# Patient Record
Sex: Male | Born: 1940 | Race: White | Hispanic: No | Marital: Married | Smoking: Former smoker
Health system: Southern US, Community
[De-identification: ages and names within clinical notes are randomized; demographics above are authoritative.]

## PROBLEM LIST (undated history)

## (undated) DIAGNOSIS — I10 Essential (primary) hypertension: Secondary | ICD-10-CM

## (undated) DIAGNOSIS — R0602 Shortness of breath: Secondary | ICD-10-CM

## (undated) DIAGNOSIS — I639 Cerebral infarction, unspecified: Secondary | ICD-10-CM

## (undated) DIAGNOSIS — I214 Non-ST elevation (NSTEMI) myocardial infarction: Secondary | ICD-10-CM

## (undated) DIAGNOSIS — C801 Malignant (primary) neoplasm, unspecified: Secondary | ICD-10-CM

## (undated) DIAGNOSIS — H409 Unspecified glaucoma: Secondary | ICD-10-CM

## (undated) DIAGNOSIS — D696 Thrombocytopenia, unspecified: Secondary | ICD-10-CM

---

## 2007-10-28 HISTORY — PX: CATARACT EXTRACTION, BILATERAL: SHX1313

## 2013-02-09 ENCOUNTER — Emergency Department (HOSPITAL_COMMUNITY): Payer: Medicare Other

## 2013-02-09 ENCOUNTER — Inpatient Hospital Stay (HOSPITAL_COMMUNITY)
Admission: EM | Admit: 2013-02-09 | Discharge: 2013-02-12 | DRG: 844 | Disposition: A | Discharge status: Home / Self Care | Payer: Medicare Other | Attending: Internal Medicine | Admitting: Internal Medicine

## 2013-02-09 ENCOUNTER — Encounter (HOSPITAL_COMMUNITY): Payer: Self-pay | Admitting: *Deleted

## 2013-02-09 DIAGNOSIS — C50919 Malignant neoplasm of unspecified site of unspecified female breast: Principal | ICD-10-CM | POA: Diagnosis present

## 2013-02-09 DIAGNOSIS — Z79899 Other long term (current) drug therapy: Secondary | ICD-10-CM

## 2013-02-09 DIAGNOSIS — Z791 Long term (current) use of non-steroidal anti-inflammatories (NSAID): Secondary | ICD-10-CM

## 2013-02-09 DIAGNOSIS — C7952 Secondary malignant neoplasm of bone marrow: Secondary | ICD-10-CM

## 2013-02-09 DIAGNOSIS — M79605 Pain in left leg: Secondary | ICD-10-CM | POA: Diagnosis present

## 2013-02-09 DIAGNOSIS — H409 Unspecified glaucoma: Secondary | ICD-10-CM | POA: Diagnosis present

## 2013-02-09 DIAGNOSIS — C412 Malignant neoplasm of vertebral column: Secondary | ICD-10-CM

## 2013-02-09 DIAGNOSIS — Z87891 Personal history of nicotine dependence: Secondary | ICD-10-CM

## 2013-02-09 DIAGNOSIS — C7970 Secondary malignant neoplasm of unspecified adrenal gland: Secondary | ICD-10-CM | POA: Diagnosis present

## 2013-02-09 DIAGNOSIS — G988 Other disorders of nervous system: Secondary | ICD-10-CM | POA: Diagnosis present

## 2013-02-09 DIAGNOSIS — I10 Essential (primary) hypertension: Secondary | ICD-10-CM | POA: Diagnosis present

## 2013-02-09 DIAGNOSIS — R918 Other nonspecific abnormal finding of lung field: Secondary | ICD-10-CM | POA: Diagnosis present

## 2013-02-09 DIAGNOSIS — C3490 Malignant neoplasm of unspecified part of unspecified bronchus or lung: Secondary | ICD-10-CM | POA: Diagnosis present

## 2013-02-09 DIAGNOSIS — C801 Malignant (primary) neoplasm, unspecified: Secondary | ICD-10-CM

## 2013-02-09 DIAGNOSIS — C7951 Secondary malignant neoplasm of bone: Secondary | ICD-10-CM | POA: Diagnosis present

## 2013-02-09 DIAGNOSIS — M7989 Other specified soft tissue disorders: Secondary | ICD-10-CM | POA: Diagnosis present

## 2013-02-09 DIAGNOSIS — M799 Soft tissue disorder, unspecified: Secondary | ICD-10-CM

## 2013-02-09 DIAGNOSIS — M79609 Pain in unspecified limb: Secondary | ICD-10-CM

## 2013-02-09 DIAGNOSIS — R209 Unspecified disturbances of skin sensation: Secondary | ICD-10-CM | POA: Diagnosis present

## 2013-02-09 HISTORY — DX: Malignant (primary) neoplasm, unspecified: C80.1

## 2013-02-09 HISTORY — DX: Essential (primary) hypertension: I10

## 2013-02-09 HISTORY — DX: Unspecified glaucoma: H40.9

## 2013-02-09 LAB — COMPREHENSIVE METABOLIC PANEL
BUN: 13 mg/dL (ref 6–23)
CO2: 29 mEq/L (ref 19–32)
Calcium: 9.8 mg/dL (ref 8.4–10.5)
Creatinine, Ser: 0.84 mg/dL (ref 0.50–1.35)
GFR calc Af Amer: >90 mL/min (ref >90)
GFR calc non Af Amer: 87 mL/min — ABNORMAL LOW (ref >90)
Glucose, Bld: 122 mg/dL — ABNORMAL HIGH (ref 70–99)
Total Protein: 7.2 g/dL (ref 6.0–8.3)

## 2013-02-09 LAB — CBC WITH DIFFERENTIAL/PLATELET
Eosinophils Absolute: 0.4 10*3/uL (ref 0.0–0.7)
Eosinophils Relative: 2 % (ref 0–5)
HCT: 41.9 % (ref 39.0–52.0)
Lymphocytes Relative: 11 % — ABNORMAL LOW (ref 12–46)
Lymphs Abs: 1.7 10*3/uL (ref 0.7–4.0)
MCH: 31 pg (ref 26.0–34.0)
MCV: 90.1 fL (ref 78.0–100.0)
Monocytes Absolute: 1.1 10*3/uL — ABNORMAL HIGH (ref 0.1–1.0)
Platelets: 220 10*3/uL (ref 150–400)
RBC: 4.65 MIL/uL (ref 4.22–5.81)
WBC: 15.1 10*3/uL — ABNORMAL HIGH (ref 4.0–10.5)

## 2013-02-09 MED ORDER — IOHEXOL 300 MG/ML  SOLN
50.0000 mL | Freq: Once | INTRAMUSCULAR | Status: AC | PRN
  Administered 2013-02-09: 50 mL via ORAL

## 2013-02-09 MED ORDER — SODIUM CHLORIDE 0.9 % IV BOLUS (SEPSIS)
500.0000 mL | Freq: Once | INTRAVENOUS | Status: AC
  Administered 2013-02-09: 500 mL via INTRAVENOUS

## 2013-02-09 MED ORDER — IOHEXOL 300 MG/ML  SOLN
100.0000 mL | Freq: Once | INTRAMUSCULAR | Status: AC | PRN
  Administered 2013-02-09: 100 mL via INTRAVENOUS

## 2013-02-09 MED ORDER — HYDROMORPHONE HCL PF 1 MG/ML IJ SOLN
1.0000 mg | Freq: Once | INTRAMUSCULAR | Status: AC
  Administered 2013-02-09: 1 mg via INTRAVENOUS
  Filled 2013-02-09: qty 1

## 2013-02-09 NOTE — ED Provider Notes (Signed)
History     CSN: [id number]  Arrival date & time 02/09/13  1916   First MD Initiated Contact with Patient 02/09/13 1942      No chief complaint on file.  low back pain with abnormal MRI  (Consider location/radiation/quality/duration/timing/severity/associated sxs/prior treatment) HPI This 72 year old male is sent to the emergency department for admission to the hospitalist service with oncology consultation plan for tomorrow due to abnormal MRI of the lumbar spine and abnormal chest x-ray, patient does have severe lumbar pain radiating down his left leg with weakness and numbness to the left leg since December 2013 progressively worsening over the last 4 months, he is still able to walk with a limp with a skate limited by pain weakness and mild numbness, he is no change in bowel or bladder function, no fever, no IV drug abuse, no trauma, no pain or weakness or numbness down his right leg. He has had no chest pain cough shortness breath abdominal pain. 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 Past Medical History  Diagnosis Date  . Cancer   . Hypertension   . Glaucoma     Past Surgical History  Procedure Laterality Date  . Cataract extraction, bilateral Bilateral 2009    Family History  Problem Relation Age of Onset  . Heart failure Mother   . Stroke Father   . Heart disease Brother     History  Substance Use Topics  . Smoking status: Former Games developer  . Smokeless tobacco: Not on file  . Alcohol  Use: No      Review of Systems 10 Systems reviewed and are negative for acute change except as noted in the HPI. Allergies  Hydrocodone  Home Medications   No current outpatient prescriptions on file.  BP 133/72  Pulse 86  Temp(Src) 97.4 F (36.3 C) (Oral)  Resp 18  Ht 5\' 11"  (1.803 m)  Wt 180 lb (81.647 kg)  BMI 25.12 kg/m2  SpO2 98%  Physical Exam  Nursing note and vitals reviewed. Constitutional:  Awake, alert, nontoxic appearance with baseline speech.  HENT:  Head: Atraumatic.  Eyes: Pupils are equal, round, and reactive to light. Right eye exhibits no discharge. Left eye exhibits no discharge.  Neck: Neck supple.  Cardiovascular: Normal rate and regular rhythm.   No murmur heard. Pulmonary/Chest: Effort normal and breath sounds normal. No respiratory distress. He has no wheezes. He has no rales. He exhibits no tenderness.  Abdominal: Soft. Bowel sounds are normal. He exhibits no mass. There is no tenderness. There is no rebound.  Musculoskeletal: He exhibits tenderness.       Thoracic back: He exhibits no tenderness.       Lumbar back: He exhibits no tenderness.  Diffuse lumber back tenderness, Bilateral lower extremities non tender without new rashes or color change, baseline ROM with intact DP pulses, CR<2 secs all digits bilaterally, sensation baseline light touch bilaterally for pt except mild numbness entire left foot, DTR's symmetric and intact bilaterally KJ / AJ, motor asymmetric right  leg 5 / 5 left leg 4/5 hip flexion, quadriceps, hamstrings, EHL, foot dorsiflexion, foot plantarflexion  Neurological:  Mental status baseline for patient.  Upper extremity motor strength and sensation intact and symmetric bilaterally.  Skin: No rash noted.  Psychiatric: He has a normal mood and affect.    ED Course  Procedures (including critical care time) [facility] with Pt for admit at 2225. Labs Reviewed  CBC WITH DIFFERENTIAL - Abnormal; Notable for the following:    WBC  15.1 (*)    Neutrophils Relative 79 (*)    Neutro Abs 11.9 (*)    Lymphocytes Relative 11 (*)    Monocytes Absolute 1.1 (*)    All other components within normal limits  COMPREHENSIVE METABOLIC PANEL - Abnormal; Notable for the following:    Glucose, Bld 122 (*)    AST 38 (*)    GFR calc non Af Amer 87 (*)    All other components within normal limits  COMPREHENSIVE METABOLIC PANEL - Abnormal; Notable for the following:    Sodium 134 (*)    Albumin 3.4 (*)    GFR calc non Af Amer 90 (*)    All other components within normal limits  CBC - Abnormal; Notable for the following:    WBC 12.7 (*)    RBC 4.16 (*)    Hemoglobin 12.8 (*)    HCT 37.6 (*)    All other components within normal limits  MAGNESIUM  PHOSPHORUS  TSH  PROTIME-INR  SURGICAL PATHOLOGY   Ct Chest W Contrast  02/09/2013  *RADIOLOGY REPORT*  Clinical Data:  Weakness and lower back pain.  Lung mass noted on chest radiograph.  Metastases suspected on lumbar spine MRI.  CT CHEST, ABDOMEN AND PELVIS WITH CONTRAST  Technique:  Multidetector CT imaging of the chest, abdomen and pelvis was performed following the standard protocol during bolus administration of intravenous contrast.  Contrast: 100 mL of Omnipaque 300 IV contrast  Comparison:  MRI of the lumbar spine performed 02/02/2013.  CT CHEST  Findings:  There is a lobulated spiculated mass within the inferior right upper lobe, measuring approximately 3.2 x 2.7 x 2.8 cm, near the right hilum.  This is concerning for primary malignancy. Scattered tiny nodules are noted throughout both lungs, more prominent in the periphery, nonspecific in appearance.  Minimal emphysematous change is noted at the lung apices.  A somewhat larger nonspecific 6 mm nodule is noted within the left lower lobe (image 26 of 61).  No pleural effusion or pneumothorax is seen.  The mediastinum is grossly unremarkable in appearance.  No mediastinal lymphadenopathy is seen.  No pericardial effusion is  identified.  Scattered calcification is noted along the thoracic aorta and proximal great vessels.  The thyroid gland is unremarkable in appearance, aside from minimal hypodensity within the left thyroid lobe.  No dominant mass is seen.  No axillary lymphadenopathy is appreciated.  A large complex soft tissue mass is noted along the right posterolateral chest wall, arising from the right fifth rib, with diffuse bony destruction and expansion.  This measures approximately 7.4 x 3.1 x 5.4 cm, and is concerning for malignancy; metastasis could have this appearance.  There appears to be a lytic lesion within vertebral body T5, with associated mild fragmentation and loss of height at T5.  No definite additional osseous abnormalities are characterized, though evaluation for bony lytic metastatic disease is limited on CT.  IMPRESSION:  1.   Lobulated spiculated mass in the inferior right upper lobe, measuring  3.2 x 2.7 x 2.8 cm, near the right hilum.  This is concerning for primary bronchogenic malignancy. 2.  Scattered tiny nodules throughout both lungs, more prominent in the periphery, nonspecific in appearance.  Mildly larger nonspecific 6 mm nodule in the left lower lobe. 3.  Minimal emphysematous change at the lung apices. 4.  Large complex soft tissue mass along the right posterolateral chest wall, arising from the right fifth rib, with diffuse bony destruction and expansion.  This measures 7.4 x 3.1 x 5.4 cm, and is concerning for malignancy; metastasis could have this appearance. 5.  Lytic lesion within vertebral body T5, with mild fragmentation and loss of height at T5.  No additional osseous abnormalities seen along the thoracic spine, though evaluation for bony metastases is somewhat limited on CT.  CT ABDOMEN AND PELVIS  Findings:  Scattered numerous hypodensities within the liver are nonspecific but have the appearance of cysts; no definite suspicious hepatic lesions are characterized.  These measure up to 3.5  cm in size. The spleen is unremarkable in appearance.  There is suggestion of mild gallbladder wall thickening; the gallbladder is otherwise grossly unremarkable in appearance.  No definite stones are characterized.  There is a mildly heterogeneous 3.1 cm mass at the right adrenal gland; this is suspicious for metastatic disease.  The left adrenal gland is unremarkable in appearance.  The kidneys are unremarkable in appearance.  There is no evidence of hydronephrosis.  No renal or ureteral stones are seen.  No perinephric stranding is appreciated.  No free fluid is identified.  The small bowel is unremarkable in appearance.  The stomach is within normal limits.  No acute vascular abnormalities are seen.  Relatively diffuse calcification is noted along the abdominal aorta and its branches, including at the origins of both renal arteries.  The appendix is normal in caliber, without evidence for appendicitis.  Mild scattered diverticulosis is noted along the cecum and ascending colon, and along the descending and proximal sigmoid colon.  The colon is otherwise unremarkable in appearance.  The bladder is significantly distended and grossly unremarkable in appearance.  The prostate is enlarged, measuring 5.4 cm in transverse dimension.  Small bilateral inguinal hernias are seen, containing only fat.  A small right-sided hydrocele is incidentally noted.  No inguinal lymphadenopathy is seen.  Lytic lesions within vertebral bodies T12, L1, L2, L4, L5 and the upper sacrum are better characterized on recent MRI.  The largest lesions are within L2 and the sacrum; there is partial fragmentation of S1, with mass extending along the left sacral ala and into the overlying soft tissues. This appears to cause mass effect on the left-sided exiting nerve root at S1-S2.  IMPRESSION:  1.  3.1 cm mildly heterogeneous mass at the right adrenal gland is suspicious for metastatic disease.  2.  Lytic lesions within vertebral bodies T12, L1,  L2, L4, L5 and the upper sacrum are better characterized on recent MRI.  Largest lesions are noted within L2 and the sacrum; partial fragmentation of S1, with mass extending along the left sacral ala and into the overlying soft tissues.  This appears to cause mass effect on the left-sided exiting nerve root at S1-S2. 3.  Scattered numerous hypodensities within the liver are nonspecific but appear to reflect cysts.  If there is significant clinical concern for hepatic metastases, further evaluation could be considered. 4.  Suggestion of mild gallbladder wall thickening; gallbladder otherwise unremarkable in appearance. 5.  Relatively diffuse calcification along the abdominal aorta and  its branches, including at the origins of the renal arteries. 6.  Mild scattered diverticulosis along the cecum and ascending colon, and along the descending and proximal sigmoid colon. 7.  Enlarged prostate noted. 8.  Small bilateral inguinal hernias, containing only fat. 9.  Small right-sided hydrocele incidentally seen.   Original Report Authenticated By: Tonia Ghent, M.D.    Ct Abdomen Pelvis W Contrast  02/09/2013  *RADIOLOGY REPORT*  Clinical Data:  Weakness and lower back pain.  Lung mass noted on chest radiograph.  Metastases suspected on lumbar spine MRI.  CT CHEST, ABDOMEN AND PELVIS WITH CONTRAST  Technique:  Multidetector CT imaging of the chest, abdomen and pelvis was performed following the standard protocol during bolus administration of intravenous contrast.  Contrast: 100 mL of Omnipaque 300 IV contrast  Comparison:  MRI of the lumbar spine performed 02/02/2013.  CT CHEST  Findings:  There is a lobulated spiculated mass within the inferior right upper lobe, measuring approximately 3.2 x 2.7 x 2.8 cm, near the right hilum.  This is concerning for primary malignancy. Scattered tiny nodules are noted throughout both lungs, more prominent in the periphery, nonspecific in appearance.  Minimal emphysematous change is  noted at the lung apices.  A somewhat larger nonspecific 6 mm nodule is noted within the left lower lobe (image 26 of 61).  No pleural effusion or pneumothorax is seen.  The mediastinum is grossly unremarkable in appearance.  No mediastinal lymphadenopathy is seen.  No pericardial effusion is identified.  Scattered calcification is noted along the thoracic aorta and proximal great vessels.  The thyroid gland is unremarkable in appearance, aside from minimal hypodensity within the left thyroid lobe.  No dominant mass is seen.  No axillary lymphadenopathy is appreciated.  A large complex soft tissue mass is noted along the right posterolateral chest wall, arising from the right fifth rib, with diffuse bony destruction and expansion.  This measures approximately 7.4 x 3.1 x 5.4 cm, and is concerning for malignancy; metastasis could have this appearance.  There appears to be a lytic lesion within vertebral body T5, with associated mild fragmentation and loss of height at T5.  No definite additional osseous abnormalities are characterized, though evaluation for bony lytic metastatic disease is limited on CT.  IMPRESSION:  1.   Lobulated spiculated mass in the inferior right upper lobe, measuring 3.2 x 2.7 x 2.8 cm, near the right hilum.  This is concerning for primary bronchogenic malignancy. 2.  Scattered tiny nodules throughout both lungs, more prominent in the periphery, nonspecific in appearance.  Mildly larger nonspecific 6 mm nodule in the left lower lobe. 3.  Minimal emphysematous change at the lung apices. 4.  Large complex soft tissue mass along the right posterolateral chest wall, arising from the right fifth rib, with diffuse bony destruction and expansion.  This measures 7.4 x 3.1 x 5.4 cm, and is concerning for malignancy; metastasis could have this appearance. 5.  Lytic lesion within vertebral body T5, with mild fragmentation and loss of height at T5.  No additional osseous abnormalities seen along the  thoracic spine, though evaluation for bony metastases is somewhat limited on CT.  CT ABDOMEN AND PELVIS  Findings:  Scattered numerous hypodensities within the liver are nonspecific but have the appearance of cysts; no definite suspicious hepatic lesions are characterized.  These measure up to 3.5 cm in size. The spleen is unremarkable in appearance.  There is suggestion of mild gallbladder wall thickening; the gallbladder is otherwise grossly unremarkable in  appearance.  No definite stones are characterized.  There is a mildly heterogeneous 3.1 cm mass at the right adrenal gland; this is suspicious for metastatic disease.  The left adrenal gland is unremarkable in appearance.  The kidneys are unremarkable in appearance.  There is no evidence of hydronephrosis.  No renal or ureteral stones are seen.  No perinephric stranding is appreciated.  No free fluid is identified.  The small bowel is unremarkable in appearance.  The stomach is within normal limits.  No acute vascular abnormalities are seen.  Relatively diffuse calcification is noted along the abdominal aorta and its branches, including at the origins of both renal arteries.  The appendix is normal in caliber, without evidence for appendicitis.  Mild scattered diverticulosis is noted along the cecum and ascending colon, and along the descending and proximal sigmoid colon.  The colon is otherwise unremarkable in appearance.  The bladder is significantly distended and grossly unremarkable in appearance.  The prostate is enlarged, measuring 5.4 cm in transverse dimension.  Small bilateral inguinal hernias are seen, containing only fat.  A small right-sided hydrocele is incidentally noted.  No inguinal lymphadenopathy is seen.  Lytic lesions within vertebral bodies T12, L1, L2, L4, L5 and the upper sacrum are better characterized on recent MRI.  The largest lesions are within L2 and the sacrum; there is partial fragmentation of S1, with mass extending along the  left sacral ala and into the overlying soft tissues. This appears to cause mass effect on the left-sided exiting nerve root at S1-S2.  IMPRESSION:  1.  3.1 cm mildly heterogeneous mass at the right adrenal gland is suspicious for metastatic disease.  2.  Lytic lesions within vertebral bodies T12, L1, L2, L4, L5 and the upper sacrum are better characterized on recent MRI.  Largest lesions are noted within L2 and the sacrum; partial fragmentation of S1, with mass extending along the left sacral ala and into the overlying soft tissues.  This appears to cause mass effect on the left-sided exiting nerve root at S1-S2. 3.  Scattered numerous hypodensities within the liver are nonspecific but appear to reflect cysts.  If there is significant clinical concern for hepatic metastases, further evaluation could be considered. 4.  Suggestion of mild gallbladder wall thickening; gallbladder otherwise unremarkable in appearance. 5.  Relatively diffuse calcification along the abdominal aorta and its branches, including at the origins of the renal arteries. 6.  Mild scattered diverticulosis along the cecum and ascending colon, and along the descending and proximal sigmoid colon. 7.  Enlarged prostate noted. 8.  Small bilateral inguinal hernias, containing only fat. 9.  Small right-sided hydrocele incidentally seen.   Original Report Authenticated By: Tonia Ghent, M.D.    Ct Biopsy  02/10/2013  *RADIOLOGY REPORT*  CT BIOPSY  Date: 02/10/2013  Clinical History: 72 year old male with recent CT diagnosis of right upper lobe mass, mediastinal adenopathy and expansile soft tissue mass arising from the right fifth rib concerning for metastatic primary bronchogenic carcinoma.  CT guided biopsy is requested to facilitate tissue diagnosis.  Procedures Performed: 1. CT guided core biopsy  Interventional Radiologist:  Sterling Big, MD  Sedation: Moderate (conscious) sedation was used.  Three mg Versed, 150 mcg Fentanyl were  administered intravenously.  The patient's vital signs were monitored continuously by radiology nursing throughout the procedure.  Sedation Time: 21 minutes  PROCEDURE/FINDINGS:   Informed consent was obtained from the patient following explanation of the procedure, risks, benefits and alternatives. The patient understands, agrees and consents  for the procedure. All questions were addressed. A time out was performed.  Maximal barrier sterile technique utilized including caps, mask, sterile gowns, sterile gloves, large sterile drape, hand hygiene, and betadine skin prep.  A planning axial CT scan was performed.  The expansile soft tissue lesion arising from the posterolateral right fifth rib was successfully identified.  A suitable skin entry site was selected and marked.  Local anesthesia was achieved by infiltration of 1% lidocaine.  Under direct CT fluoroscopic guidance a 17 gauge trocar needle was advanced through the posterior thoracic wall and positioned within the soft tissue mass.  Multiple 80 gauge core biopsies were then obtained using the Biopince automated biopsy device.  There is modest bleeding through the outer coaxial needle. Therefore the biopsy tract was embolized with a single Gelfoam torpedo as the needle was removed.  Post biopsy axial CT imaging demonstrated no evidence of large hematoma or other complication. The patient tolerated the procedure well.  IMPRESSION:  Technically successful CT-guided core biopsy of expansile soft tissue mass arising from the right fifth rib.  Signed,  Sterling Big, MD Vascular & Interventional Radiologist [facility]   Original Report Authenticated By: Malachy Moan, M.D.      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 Hypertension   3. Pain of left leg   4. Soft tissue mass   5. Lung mass   6. Malignant tumor of vertebral column       MDM  The patient appears reasonably stabilized for admission considering the current resources, flow, and  capabilities available in the ED at this time, and I doubt any other [facility] requiring further screening and/or treatment in the ED prior to admission.        Hurman Horn, MD 02/11/13 [phone]

## 2013-02-09 NOTE — ED Notes (Signed)
Pt comes from cancer center in [place], sts has "nodes that need to be biopsied" . 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 Pt's wife sts Dr Ubaldo Glassing is oncology doctor who told them to come to ED for biopsy.

## 2013-02-09 NOTE — H&P (Signed)
PCP: Sherley Bounds,  [facility] [place]   Chief Complaint:   Left Leg pain  HPI: Gabriel [name] is a 72 y.o. male   has a past medical history of Cancer; Hypertension; and Glaucoma.   Presented with  3 months ago he started to have progressive Left leg pain and weakness at this point his walking ability is affected but mainly due to pain.  1 Week ago he had an MRI done of the lumbar spine at [facility] showing L2 lesion measuring 2.9 cm and S1 clear. Replaced by tumor with extension of tumor into the epidural space posterior involvement of the S1 nerve root. There was also lesions involving of the iliac bones bilaterally. A chest x-ray was done and showed questionable or mass this was discussed apparently with oncology who recommended patient to be admitted to [facility] for further biopsy and workup. Of note his workup also showed normal PSA is slightly elevated CEA at 14.1. M spike was not detected on electrophoresis.  The patient denies any incontinence. He states that his walking is affected mainly back pain weakness. He has soft on putting weight on his left leg due to pain and has some pain with sitting. He denies any coughing no hemoptysis. He have lost about 10 pounds lately.   Review of Systems:    Pertinent positives include: weight loss 10 lb over 1 month.  localizing neurological complaints some weakness in left leg.   Constitutional:  No weight loss, night sweats, Fevers, chills, fatigue,  HEENT:  No headaches, Difficulty swallowing,Tooth/dental problems,Sore throat,  No sneezing, itching, ear ache, nasal congestion, post nasal drip,  Cardio-vascular:  No chest pain, Orthopnea, PND, anasarca, dizziness, palpitations.no Bilateral lower extremity swelling  GI:  No heartburn, indigestion, abdominal pain, nausea, vomiting, diarrhea, change in bowel habits, loss of appetite, melena, blood in stool, hematemesis Resp:  no shortness of breath at rest. No dyspnea  on exertion, No excess mucus, no productive cough, No non-productive cough, No coughing up of blood.No change in color of mucus.No wheezing. Skin:  no rash or lesions. No jaundice GU:  no dysuria, change in color of urine, no urgency or frequency. No straining to urinate.  No flank pain.  Musculoskeletal:  No joint pain or no joint swelling. No decreased range of motion. No back pain.  Psych:  No change in mood or affect. No depression or anxiety. No memory loss.  Neuro: no no tingling, no weakness, no double vision, no gait abnormality, no slurred speech, no confusion  Otherwise ROS are negative except for above, 10 systems were reviewed  Past Medical History: Past Medical History  Diagnosis Date  . Cancer   . Hypertension   . Glaucoma    Past Surgical History  Procedure Laterality Date  . Cataract extraction, bilateral Bilateral 2009     Medications: Prior to Admission medications   Medication Sig Start Date End Date Taking? Authorizing Provider  Flaxseed, Linseed, (FLAX SEED OIL PO) Take 1 capsule by mouth daily.   Yes Historical Provider, MD  ibuprofen (ADVIL,MOTRIN) 200 MG tablet Take 400 mg by mouth every 8 (eight) hours as needed for pain.   Yes Historical Provider, MD  Multiple Vitamin (MULTIVITAMIN WITH MINERALS) TABS Take 1 tablet by mouth daily.   Yes Historical Provider, MD  oxyCODONE-acetaminophen (PERCOCET) 10-325 MG per tablet Take 0.5-1 tablets by mouth every 6 (six) hours as needed for pain.   Yes Historical Provider, MD    Allergies:   Allergies  Allergen  Reactions  . Hydrocodone Rash    Social History:  Ambulatory  Independently  Lives at home alone   reports that he has quit smoking. He does not have any smokeless tobacco history on file. He reports that he does not drink alcohol or use illicit drugs.   Family History: family history includes Heart disease in his brother; Heart failure in his mother; and Stroke in his father.    Physical  Exam: Patient Vitals for the past 24 hrs:  BP Temp Temp src Pulse Resp SpO2  02/09/13 1925 190/85 mmHg 98.2 F (36.8 C) Oral 81 14 99 %    1. General:  in No Acute distress 2. Psychological: Alert and   Oriented 3. Head/ENT:   Moist   Mucous Membranes                          Head Non traumatic, neck supple                          Normal  Dentition 4. SKIN:  decreased Skin turgor,  Skin clean Dry and intact no rash 5. Heart: Regular rate and rhythm no Murmur, Rub or gallop 6. Lungs: somewhat distant but no wheezes or crackles   7. Abdomen: Soft, non-tender, Non distended 8. Lower extremities: no clubbing, cyanosis, or edema 9. Neurologically strength 5/5 in all 4 ext, no noticible loss of strength in both legs, down going babiski bilaterally. CN 2-12 intact 10. MSK: Normal range of motion  body mass index is unknown because there is no height or weight on file.   Labs on Admission:   Recent Labs  02/09/13 2025  NA 136  K 4.5  CL 98  CO2 29  GLUCOSE 122*  BUN 13  CREATININE 0.84  CALCIUM 9.8    Recent Labs  02/09/13 2025  AST 38*  ALT 29  ALKPHOS 91  BILITOT 0.6  PROT 7.2  ALBUMIN 3.9   No results found for this basename: LIPASE, AMYLASE,  in the last 72 hours  Recent Labs  02/09/13 2025  WBC 15.1*  NEUTROABS 11.9*  HGB 14.4  HCT 41.9  MCV 90.1  PLT 220   No results found for this basename: CKTOTAL, CKMB, CKMBINDEX, TROPONINI,  in the last 72 hours No results found for this basename: TSH, T4TOTAL, FREET3, T3FREE, THYROIDAB,  in the last 72 hours No results found for this basename: VITAMINB12, FOLATE, FERRITIN, TIBC, IRON, RETICCTPCT,  in the last 72 hours No results found for this basename: HGBA1C    CrCl is unknown because there is no height on file for the current visit. ABG No results found for this basename: phart, pco2, po2, hco3, tco2, acidbasedef, o2sat     No results found for this basename: DDIMER    Cultures: No results found for  this basename: sdes, specrequest, cult, reptstatus       Radiological Exams on Admission: Ct Chest W Contrast  02/09/2013  *RADIOLOGY REPORT*  Clinical Data:  Weakness and lower back pain.  Lung mass noted on chest radiograph.  Metastases suspected on lumbar spine MRI.  CT CHEST, ABDOMEN AND PELVIS WITH CONTRAST  Technique:  Multidetector CT imaging of the chest, abdomen and pelvis was performed following the standard protocol during bolus administration of intravenous contrast.  Contrast: 100 mL of Omnipaque 300 IV contrast  Comparison:  MRI of the lumbar spine performed 02/02/2013.  CT CHEST  Findings:  There is a lobulated spiculated mass within the inferior right upper lobe, measuring approximately 3.2 x 2.7 x 2.8 cm, near the right hilum.  This is concerning for primary malignancy. Scattered tiny nodules are noted throughout both lungs, more prominent in the periphery, nonspecific in appearance.  Minimal emphysematous change is noted at the lung apices.  A somewhat larger nonspecific 6 mm nodule is noted within the left lower lobe (image 26 of 61).  No pleural effusion or pneumothorax is seen.  The mediastinum is grossly unremarkable in appearance.  No mediastinal lymphadenopathy is seen.  No pericardial effusion is identified.  Scattered calcification is noted along the thoracic aorta and proximal great vessels.  The thyroid gland is unremarkable in appearance, aside from minimal hypodensity within the left thyroid lobe.  No dominant mass is seen.  No axillary lymphadenopathy is appreciated.  A large complex soft tissue mass is noted along the right posterolateral chest wall, arising from the right fifth rib, with diffuse bony destruction and expansion.  This measures approximately 7.4 x 3.1 x 5.4 cm, and is concerning for malignancy; metastasis could have this appearance.  There appears to be a lytic lesion within vertebral body T5, with associated mild fragmentation and loss of height at T5.  No  definite additional osseous abnormalities are characterized, though evaluation for bony lytic metastatic disease is limited on CT.  IMPRESSION:  1.   Lobulated spiculated mass in the inferior right upper lobe, measuring 3.2 x 2.7 x 2.8 cm, near the right hilum.  This is concerning for primary bronchogenic malignancy. 2.  Scattered tiny nodules throughout both lungs, more prominent in the periphery, nonspecific in appearance.  Mildly larger nonspecific 6 mm nodule in the left lower lobe. 3.  Minimal emphysematous change at the lung apices. 4.  Large complex soft tissue mass along the right posterolateral chest wall, arising from the right fifth rib, with diffuse bony destruction and expansion.  This measures 7.4 x 3.1 x 5.4 cm, and is concerning for malignancy; metastasis could have this appearance. 5.  Lytic lesion within vertebral body T5, with mild fragmentation and loss of height at T5.  No additional osseous abnormalities seen along the thoracic spine, though evaluation for bony metastases is somewhat limited on CT.  CT ABDOMEN AND PELVIS  Findings:  Scattered numerous hypodensities within the liver are nonspecific but have the appearance of cysts; no definite suspicious hepatic lesions are characterized.  These measure up to 3.5 cm in size. The spleen is unremarkable in appearance.  There is suggestion of mild gallbladder wall thickening; the gallbladder is otherwise grossly unremarkable in appearance.  No definite stones are characterized.  There is a mildly heterogeneous 3.1 cm mass at the right adrenal gland; this is suspicious for metastatic disease.  The left adrenal gland is unremarkable in appearance.  The kidneys are unremarkable in appearance.  There is no evidence of hydronephrosis.  No renal or ureteral stones are seen.  No perinephric stranding is appreciated.  No free fluid is identified.  The small bowel is unremarkable in appearance.  The stomach is within normal limits.  No acute vascular  abnormalities are seen.  Relatively diffuse calcification is noted along the abdominal aorta and its branches, including at the origins of both renal arteries.  The appendix is normal in caliber, without evidence for appendicitis.  Mild scattered diverticulosis is noted along the cecum and ascending colon, and along the descending and proximal sigmoid colon.  The colon is otherwise unremarkable  in appearance.  The bladder is significantly distended and grossly unremarkable in appearance.  The prostate is enlarged, measuring 5.4 cm in transverse dimension.  Small bilateral inguinal hernias are seen, containing only fat.  A small right-sided hydrocele is incidentally noted.  No inguinal lymphadenopathy is seen.  Lytic lesions within vertebral bodies T12, L1, L2, L4, L5 and the upper sacrum are better characterized on recent MRI.  The largest lesions are within L2 and the sacrum; there is partial fragmentation of S1, with mass extending along the left sacral ala and into the overlying soft tissues. This appears to cause mass effect on the left-sided exiting nerve root at S1-S2.  IMPRESSION:  1.  3.1 cm mildly heterogeneous mass at the right adrenal gland is suspicious for metastatic disease.  2.  Lytic lesions within vertebral bodies T12, L1, L2, L4, L5 and the upper sacrum are better characterized on recent MRI.  Largest lesions are noted within L2 and the sacrum; partial fragmentation of S1, with mass extending along the left sacral ala and into the overlying soft tissues.  This appears to cause mass effect on the left-sided exiting nerve root at S1-S2. 3.  Scattered numerous hypodensities within the liver are nonspecific but appear to reflect cysts.  If there is significant clinical concern for hepatic metastases, further evaluation could be considered. 4.  Suggestion of mild gallbladder wall thickening; gallbladder otherwise unremarkable in appearance. 5.  Relatively diffuse calcification along the abdominal aorta  and its branches, including at the origins of the renal arteries. 6.  Mild scattered diverticulosis along the cecum and ascending colon, and along the descending and proximal sigmoid colon. 7.  Enlarged prostate noted. 8.  Small bilateral inguinal hernias, containing only fat. 9.  Small right-sided hydrocele incidentally seen.   Original Report Authenticated By: Tonia Ghent, M.D.    Ct Abdomen Pelvis W Contrast  02/09/2013  *RADIOLOGY REPORT*  Clinical Data:  Weakness and lower back pain.  Lung mass noted on chest radiograph.  Metastases suspected on lumbar spine MRI.  CT CHEST, ABDOMEN AND PELVIS WITH CONTRAST  Technique:  Multidetector CT imaging of the chest, abdomen and pelvis was performed following the standard protocol during bolus administration of intravenous contrast.  Contrast: 100 mL of Omnipaque 300 IV contrast  Comparison:  MRI of the lumbar spine performed 02/02/2013.  CT CHEST  Findings:  There is a lobulated spiculated mass within the inferior right upper lobe, measuring approximately 3.2 x 2.7 x 2.8 cm, near the right hilum.  This is concerning for primary malignancy. Scattered tiny nodules are noted throughout both lungs, more prominent in the periphery, nonspecific in appearance.  Minimal emphysematous change is noted at the lung apices.  A somewhat larger nonspecific 6 mm nodule is noted within the left lower lobe (image 26 of 61).  No pleural effusion or pneumothorax is seen.  The mediastinum is grossly unremarkable in appearance.  No mediastinal lymphadenopathy is seen.  No pericardial effusion is identified.  Scattered calcification is noted along the thoracic aorta and proximal great vessels.  The thyroid gland is unremarkable in appearance, aside from minimal hypodensity within the left thyroid lobe.  No dominant mass is seen.  No axillary lymphadenopathy is appreciated.  A large complex soft tissue mass is noted along the right posterolateral chest wall, arising from the right fifth  rib, with diffuse bony destruction and expansion.  This measures approximately 7.4 x 3.1 x 5.4 cm, and is concerning for malignancy; metastasis could have this appearance.  There appears to be a lytic lesion within vertebral body T5, with associated mild fragmentation and loss of height at T5.  No definite additional osseous abnormalities are characterized, though evaluation for bony lytic metastatic disease is limited on CT.  IMPRESSION:  1.   Lobulated spiculated mass in the inferior right upper lobe, measuring 3.2 x 2.7 x 2.8 cm, near the right hilum.  This is concerning for primary bronchogenic malignancy. 2.  Scattered tiny nodules throughout both lungs, more prominent in the periphery, nonspecific in appearance.  Mildly larger nonspecific 6 mm nodule in the left lower lobe. 3.  Minimal emphysematous change at the lung apices. 4.  Large complex soft tissue mass along the right posterolateral chest wall, arising from the right fifth rib, with diffuse bony destruction and expansion.  This measures 7.4 x 3.1 x 5.4 cm, and is concerning for malignancy; metastasis could have this appearance. 5.  Lytic lesion within vertebral body T5, with mild fragmentation and loss of height at T5.  No additional osseous abnormalities seen along the thoracic spine, though evaluation for bony metastases is somewhat limited on CT.  CT ABDOMEN AND PELVIS  Findings:  Scattered numerous hypodensities within the liver are nonspecific but have the appearance of cysts; no definite suspicious hepatic lesions are characterized.  These measure up to 3.5 cm in size. The spleen is unremarkable in appearance.  There is suggestion of mild gallbladder wall thickening; the gallbladder is otherwise grossly unremarkable in appearance.  No definite stones are characterized.  There is a mildly heterogeneous 3.1 cm mass at the right adrenal gland; this is suspicious for metastatic disease.  The left adrenal gland is unremarkable in appearance.  The  kidneys are unremarkable in appearance.  There is no evidence of hydronephrosis.  No renal or ureteral stones are seen.  No perinephric stranding is appreciated.  No free fluid is identified.  The small bowel is unremarkable in appearance.  The stomach is within normal limits.  No acute vascular abnormalities are seen.  Relatively diffuse calcification is noted along the abdominal aorta and its branches, including at the origins of both renal arteries.  The appendix is normal in caliber, without evidence for appendicitis.  Mild scattered diverticulosis is noted along the cecum and ascending colon, and along the descending and proximal sigmoid colon.  The colon is otherwise unremarkable in appearance.  The bladder is significantly distended and grossly unremarkable in appearance.  The prostate is enlarged, measuring 5.4 cm in transverse dimension.  Small bilateral inguinal hernias are seen, containing only fat.  A small right-sided hydrocele is incidentally noted.  No inguinal lymphadenopathy is seen.  Lytic lesions within vertebral bodies T12, L1, L2, L4, L5 and the upper sacrum are better characterized on recent MRI.  The largest lesions are within L2 and the sacrum; there is partial fragmentation of S1, with mass extending along the left sacral ala and into the overlying soft tissues. This appears to cause mass effect on the left-sided exiting nerve root at S1-S2.  IMPRESSION:  1.  3.1 cm mildly heterogeneous mass at the right adrenal gland is suspicious for metastatic disease.  2.  Lytic lesions within vertebral bodies T12, L1, L2, L4, L5 and the upper sacrum are better characterized on recent MRI.  Largest lesions are noted within L2 and the sacrum; partial fragmentation of S1, with mass extending along the left sacral ala and into the overlying soft tissues.  This appears to cause mass effect on the left-sided exiting nerve root  at S1-S2. 3.  Scattered numerous hypodensities within the liver are nonspecific but  appear to reflect cysts.  If there is significant clinical concern for hepatic metastases, further evaluation could be considered. 4.  Suggestion of mild gallbladder wall thickening; gallbladder otherwise unremarkable in appearance. 5.  Relatively diffuse calcification along the abdominal aorta and its branches, including at the origins of the renal arteries. 6.  Mild scattered diverticulosis along the cecum and ascending colon, and along the descending and proximal sigmoid colon. 7.  Enlarged prostate noted. 8.  Small bilateral inguinal hernias, containing only fat. 9.  Small right-sided hydrocele incidentally seen.   Original Report Authenticated By: Tonia Ghent, M.D.     Chart has been reviewed  Assessment/Plan  72 year old male with newly diagnosed metastatic disease involving spine, soft tissue or chest and rib, and adrenal gland with primary being most likely lung cancer will need further workup and biopsies  Present on Admission:  . 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 He will need a biopsy to confirm this. Have spoken to pulmonology for possible bronchoscopy in the morning. Given extensive pain will give pain medication as well as start steroids in hopes to help with neurogenic pain due to S1 compression.  Have spoken to oncology they will see patient in a.m. Have discussed the case with cardiology at this point there is no evidence of spinal cord involvement.  . Hypertension - stable continue to monitor continue home medications  . Pain of left leg - initiated steroids and narcotic pain medication  . Lung mass - most likely primary has spoken to pulmonology who will see him in a.m. at that point a decision could be made whether the site of biopsy would be bronchoscopy versus soft tissue mass  . Soft tissue mass - likely metastatic spread from primary would need to discuss with radiology if that's a good source for biopsy if bronchoscopy is  not an option   Prophylaxis: SCD  CODE STATUS: FULL CODE  Other plan as per orders.  I have spent a total of 75 min on this admission time taken to discuss the case extensively with oncology and radiology as well as pulmonology  Tarik Teixeira 02/09/2013, 10:33 PM

## 2013-02-10 ENCOUNTER — Ambulatory Visit
Admit: 2013-02-10 | Discharge: 2013-02-10 | Disposition: A | Discharge status: Home / Self Care | Payer: Medicare Other | Attending: Radiation Oncology | Admitting: Radiation Oncology

## 2013-02-10 ENCOUNTER — Inpatient Hospital Stay (HOSPITAL_COMMUNITY): Payer: Medicare Other

## 2013-02-10 ENCOUNTER — Encounter (HOSPITAL_COMMUNITY): Payer: Self-pay | Admitting: *Deleted

## 2013-02-10 ENCOUNTER — Other Ambulatory Visit: Payer: Self-pay | Admitting: Radiation Oncology

## 2013-02-10 DIAGNOSIS — C412 Malignant neoplasm of vertebral column: Secondary | ICD-10-CM

## 2013-02-10 DIAGNOSIS — R222 Localized swelling, mass and lump, trunk: Secondary | ICD-10-CM

## 2013-02-10 LAB — COMPREHENSIVE METABOLIC PANEL
Albumin: 3.4 g/dL — ABNORMAL LOW (ref 3.5–5.2)
BUN: 11 mg/dL (ref 6–23)
Chloride: 98 mEq/L (ref 96–112)
Creatinine, Ser: 0.79 mg/dL (ref 0.50–1.35)
GFR calc Af Amer: >90 mL/min (ref >90)
GFR calc non Af Amer: 90 mL/min — ABNORMAL LOW (ref >90)
Total Bilirubin: 0.5 mg/dL (ref 0.3–1.2)

## 2013-02-10 LAB — CBC
MCV: 90.4 fL (ref 78.0–100.0)
Platelets: 188 10*3/uL (ref 150–400)
RBC: 4.16 MIL/uL — ABNORMAL LOW (ref 4.22–5.81)
WBC: 12.7 10*3/uL — ABNORMAL HIGH (ref 4.0–10.5)

## 2013-02-10 LAB — MAGNESIUM: Magnesium: 1.9 mg/dL (ref 1.5–2.5)

## 2013-02-10 LAB — PROTIME-INR
INR: 1 (ref 0.00–1.49)
Prothrombin Time: 13.1 seconds (ref 11.6–15.2)

## 2013-02-10 LAB — PHOSPHORUS: Phosphorus: 3.4 mg/dL (ref 2.3–4.6)

## 2013-02-10 MED ORDER — SODIUM CHLORIDE 0.9 % IV SOLN
10.0000 mg | Freq: Once | INTRAVENOUS | Status: AC
  Administered 2013-02-10: 10 mg via INTRAVENOUS
  Filled 2013-02-10: qty 1

## 2013-02-10 MED ORDER — AMLODIPINE BESYLATE 10 MG PO TABS
10.0000 mg | ORAL_TABLET | Freq: Every day | ORAL | Status: DC
  Administered 2013-02-10 – 2013-02-12 (×3): 10 mg via ORAL
  Filled 2013-02-10 (×3): qty 1

## 2013-02-10 MED ORDER — SODIUM CHLORIDE 0.9 % IJ SOLN: 3.0000 mL | Freq: Two times a day (BID) | INTRAMUSCULAR | Status: DC

## 2013-02-10 MED ORDER — VITAMINS A & D EX OINT
TOPICAL_OINTMENT | CUTANEOUS | Status: AC
  Administered 2013-02-10: 17:00:00
  Filled 2013-02-10: qty 5

## 2013-02-10 MED ORDER — FENTANYL CITRATE 0.05 MG/ML IJ SOLN
INTRAMUSCULAR | Status: AC
  Filled 2013-02-10: qty 6

## 2013-02-10 MED ORDER — ONDANSETRON HCL 4 MG/2ML IJ SOLN: 4.0000 mg | Freq: Four times a day (QID) | INTRAMUSCULAR | Status: DC | PRN

## 2013-02-10 MED ORDER — FENTANYL CITRATE 0.05 MG/ML IJ SOLN
INTRAMUSCULAR | Status: AC | PRN
  Administered 2013-02-10 (×3): 50 ug via INTRAVENOUS

## 2013-02-10 MED ORDER — SODIUM CHLORIDE 0.9 % IJ SOLN: 3.0000 mL | INTRAMUSCULAR | Status: DC | PRN

## 2013-02-10 MED ORDER — ONDANSETRON HCL 4 MG PO TABS: 4.0000 mg | ORAL_TABLET | Freq: Four times a day (QID) | ORAL | Status: DC | PRN

## 2013-02-10 MED ORDER — LOSARTAN POTASSIUM 50 MG PO TABS
100.0000 mg | ORAL_TABLET | Freq: Every day | ORAL | Status: DC
  Filled 2013-02-10: qty 2

## 2013-02-10 MED ORDER — MIDAZOLAM HCL 2 MG/2ML IJ SOLN
INTRAMUSCULAR | Status: AC | PRN
  Administered 2013-02-10 (×3): 1 mg via INTRAVENOUS

## 2013-02-10 MED ORDER — HYDROCODONE-ACETAMINOPHEN 5-325 MG PO TABS
1.0000 | ORAL_TABLET | ORAL | Status: DC | PRN
  Administered 2013-02-10 – 2013-02-12 (×12): 2 via ORAL
  Filled 2013-02-10 (×12): qty 2

## 2013-02-10 MED ORDER — TIMOLOL MALEATE 0.5 % OP SOLN
1.0000 [drp] | Freq: Every day | OPHTHALMIC | Status: DC
  Administered 2013-02-11 – 2013-02-12 (×2): 1 [drp] via OPHTHALMIC
  Filled 2013-02-10: qty 5

## 2013-02-10 MED ORDER — ACETAMINOPHEN 650 MG RE SUPP: 650.0000 mg | Freq: Four times a day (QID) | RECTAL | Status: DC | PRN

## 2013-02-10 MED ORDER — MORPHINE SULFATE 2 MG/ML IJ SOLN: 2.0000 mg | INTRAMUSCULAR | Status: DC | PRN

## 2013-02-10 MED ORDER — MIDAZOLAM HCL 2 MG/2ML IJ SOLN
INTRAMUSCULAR | Status: AC
  Filled 2013-02-10: qty 6

## 2013-02-10 MED ORDER — ACETAMINOPHEN 325 MG PO TABS: 650.0000 mg | ORAL_TABLET | Freq: Four times a day (QID) | ORAL | Status: DC | PRN

## 2013-02-10 MED ORDER — LOSARTAN POTASSIUM 50 MG PO TABS
100.0000 mg | ORAL_TABLET | Freq: Every day | ORAL | Status: DC
  Administered 2013-02-10 – 2013-02-12 (×3): 100 mg via ORAL
  Filled 2013-02-10 (×3): qty 2

## 2013-02-10 MED ORDER — DOCUSATE SODIUM 100 MG PO CAPS
100.0000 mg | ORAL_CAPSULE | Freq: Two times a day (BID) | ORAL | Status: DC
  Administered 2013-02-10 – 2013-02-12 (×4): 100 mg via ORAL
  Filled 2013-02-10 (×6): qty 1

## 2013-02-10 MED ORDER — SODIUM CHLORIDE 0.9 % IV SOLN
250.0000 mL | INTRAVENOUS | Status: DC | PRN
  Administered 2013-02-11: 250 mL via INTRAVENOUS

## 2013-02-10 MED ORDER — DEXAMETHASONE 6 MG PO TABS
6.0000 mg | ORAL_TABLET | Freq: Four times a day (QID) | ORAL | Status: DC
  Administered 2013-02-10 – 2013-02-12 (×9): 6 mg via ORAL
  Filled 2013-02-10 (×13): qty 1

## 2013-02-10 NOTE — Procedures (Signed)
Interventional Radiology Procedure Note  Procedure: CT guided core biopsy of right posterior rib lesion. Complications: None Recommendations: - Bedrest x 2 hrs - Resume prior diet - Pathology pending  Signed,  Sterling Big, MD Vascular & Interventional Radiologist [facility]

## 2013-02-10 NOTE — Progress Notes (Signed)
INITIAL NUTRITION ASSESSMENT  DOCUMENTATION CODES Per approved criteria  -Not Applicable   INTERVENTION: - Recommend Ensure Complete BID when diet advanced - Will continue to monitor   NUTRITION DIAGNOSIS: Inadequate oral intake related to inability to eat as evidenced by NPO.   Goal: Advance diet as tolerated to regular diet  Monitor:  Weights, labs, diet advancement  Reason for Assessment: Nutrition risk   72 y.o. male  Admitting Dx: Left leg pain  ASSESSMENT: Pt with newly diagnosed metastatic with primary most likely being lung CA. Pt out of room for procedure. Per H&P, pt has lost 10 pounds unintentionally in the past month PTA.   Height: Ht Readings from Last 1 Encounters:  02/10/13 5\' 11"  (1.803 m)    Weight: Wt Readings from Last 1 Encounters:  02/10/13 180 lb (81.647 kg)    Ideal Body Weight: 172 lb  % Ideal Body Weight: 105  Wt Readings from Last 10 Encounters:  02/10/13 180 lb (81.647 kg)    Usual Body Weight: 190 lb per H&P  % Usual Body Weight: 95  BMI:  Body mass index is 25.12 kg/(m^2).  Estimated Nutritional Needs: Kcal: 2050-2450 Protein: 95-120g Fluid: 2-2.4L/day  Skin: Intact   Diet Order: NPO  EDUCATION NEEDS: -No education needs identified at this time   Intake/Output Summary (Last 24 hours) at 02/10/13 1609 Last data filed at 02/10/13 0730  Gross per 24 hour  Intake      0 ml  Output    450 ml  Net   -450 ml    Last BM: 4/16  Labs:   Recent Labs Lab 02/09/13 2025 02/10/13 0345  NA 136 134*  K 4.5 3.9  CL 98 98  CO2 29 27  BUN 13 11  CREATININE 0.84 0.79  CALCIUM 9.8 9.3  MG  --  1.9  PHOS  --  3.4  GLUCOSE 122* 99    CBG (last 3)  No results found for this basename: GLUCAP,  in the last 72 hours  Scheduled Meds: . dexamethasone  6 mg Oral Q6H  . docusate sodium  100 mg Oral BID  . fentaNYL      . midazolam      . sodium chloride  3 mL Intravenous Q12H    Continuous Infusions:   Past  Medical History  Diagnosis Date  . Cancer   . Hypertension   . Glaucoma     Past Surgical History  Procedure Laterality Date  . Cataract extraction, bilateral Bilateral [address] [place], [place] [phone] Pager [phone] After Hours Pager

## 2013-02-10 NOTE — Progress Notes (Addendum)
[facility] PROGRESS NOTE  Gabriel [name] YQM:[id number] DOB: 1941/08/28 DOA: 02/09/2013 PCP: No primary provider on file.  Brief narrative: Addendum to admission note done today 02/10/13 72 year old male who presented with left lower extremity numbness and back pain. 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 In addition there is a partial fragmentation of S1, with mass extending along the left sacral ala which appears to cause mass effect on the left-sided exiting nerve root at S1-S2. Patient has soft tissue mass in right 5th rib biopsied today and we are awaiting for the final results. Oncology and radiation oncology are following.   Assessment/Plan:  Principal Problem: *Left lower extremity numbness, S!-S2 nerve compression - this appears to be from mass identified on CT scan which extends along the left sacral ala causing mass effect - radiation oncology consulted - continue decadron 6 mg Q 6 hours - pain management for back pain with morphine 2-4 mg Q 4 hours IV PRN severe pain and norco for moderate pain PRN Active Problems:   Hypertension - continue Norvasc and losartan  Lung mass - awaiting biopsy results of soft tissue mass - oncology is following, appreciate their input  Code Status: full code Family Communication: no family at bedside Disposition Plan: home when stable  Manson Passey, MD  [facility] Pager [phone]  If 7PM-7AM, please contact night-coverage [email or web address] Password [email or web address] 02/10/2013, 6:52 PM   LOS: 1 day   Consultants:  Oncology (Dr. Clelia Croft)  Radiation oncology (Dr. Kathrynn Running)  PCCM  Procedures:  CT guided biopsy of soft tissue mass arising from 5th right rib 02/10/13  Antibiotics:  None   HPI/Subjective: No acute  events since admission.  Objective: Filed Vitals:   02/10/13 1555 02/10/13 1559 02/10/13 1605 02/10/13 1609  BP: 112/64 109/59 106/59 114/64  Pulse: 96 94 93 94  Temp:      TempSrc:      Resp: 14 18 12 18   Height:      Weight:      SpO2: 99% 99% 99% 99%    Intake/Output Summary (Last 24 hours) at 02/10/13 1852 Last data filed at 02/10/13 0730  Gross per 24 hour  Intake      0 ml  Output    450 ml  Net   -450 ml    Exam:   General:  Pt is alert, follows commands appropriately, not in acute distress  Cardiovascular: Regular rate and rhythm, S1/S2, no murmurs, no rubs, no gallops  Respiratory: Clear to auscultation bilaterally, no wheezing, no crackles, no rhonchi  Abdomen: Soft, non tender, non distended, bowel sounds present, no guarding  Extremities: No edema, pulses DP and PT palpable bilaterally  Neuro: Grossly nonfocal  Data Reviewed: Basic Metabolic Panel:  Recent Labs Lab 02/09/13 2025 02/10/13 0345  NA 136 134*  K 4.5 3.9  CL 98 98  CO2 29 27  GLUCOSE 122* 99  BUN 13 11  CREATININE 0.84 0.79  CALCIUM 9.8 9.3  MG  --  1.9  PHOS  --  3.4   Liver Function Tests:  Recent Labs Lab 02/09/13 2025 02/10/13 0345  AST 38* 32  ALT 29 26  ALKPHOS 91 75  BILITOT 0.6 0.5  PROT 7.2 6.1  ALBUMIN 3.9  3.4*   No results found for this basename: LIPASE, AMYLASE,  in the last 168 hours No results found for this basename: AMMONIA,  in the last 168 hours CBC:  Recent Labs Lab 02/09/13 2025 02/10/13 0345  WBC 15.1* 12.7*  NEUTROABS 11.9*  --   HGB 14.4 12.8*  HCT 41.9 37.6*  MCV 90.1 90.4  PLT 220 188   Cardiac Enzymes: No results found for this basename: CKTOTAL, CKMB, CKMBINDEX, TROPONINI,  in the last 168 hours BNP: No components found with this basename: POCBNP,  CBG: No results found for this basename: GLUCAP,  in the last 168 hours  No results found for this or any previous visit (from the past 240 hour(s)).   Studies: Ct Chest W  Contrast 02/09/2013  * IMPRESSION:  1.   Lobulated spiculated mass in the inferior right upper lobe, measuring 3.2 x 2.7 x 2.8 cm, near the right hilum.  This is concerning for primary bronchogenic malignancy. 2.  Scattered tiny nodules throughout both lungs, more prominent in the periphery, nonspecific in appearance.  Mildly larger nonspecific 6 mm nodule in the left lower lobe. 3.  Minimal emphysematous change at the lung apices. 4.  Large complex soft tissue mass along the right posterolateral chest wall, arising from the right fifth rib, with diffuse bony destruction and expansion.  This measures 7.4 x 3.1 x 5.4 cm, and is concerning for malignancy; metastasis could have this appearance. 5.  Lytic lesion within vertebral body T5, with mild fragmentation and loss of height at T5.  No additional osseous abnormalities seen along the thoracic spine, though evaluation for bony metastases is somewhat limited on CT.  CT ABDOMEN AND PELVIS   IMPRESSION:  1.  3.1 cm mildly heterogeneous mass at the right adrenal gland is suspicious for metastatic disease.  2.  Lytic lesions within vertebral bodies T12, L1, L2, L4, L5 and the upper sacrum are better characterized on recent MRI.  Largest lesions are noted within L2 and the sacrum; partial fragmentation of S1, with mass extending along the left sacral ala and into the overlying soft tissues.  This appears to cause mass effect on the left-sided exiting nerve root at S1-S2. 3.  Scattered numerous hypodensities within the liver are nonspecific but appear to reflect cysts.  If there is significant clinical concern for hepatic metastases, further evaluation could be considered. 4.  Suggestion of mild gallbladder wall thickening; gallbladder otherwise unremarkable in appearance. 5.  Relatively diffuse calcification along the abdominal aorta and its branches, including at the origins of the renal arteries. 6.  Mild scattered diverticulosis along the cecum and ascending colon, and  along the descending and proximal sigmoid colon. 7.  Enlarged prostate noted. 8.  Small bilateral inguinal hernias, containing only fat. 9.  Small right-sided hydrocele incidentally seen.     Ct Abdomen Pelvis W Contrast 02/09/2013  *RADIOLOGY REPORT*  Clinical Data:  Weakness and lower back pain.  Lung mass noted on chest radiograph.  Metastases suspected on lumbar spine MRI.  CT CHEST, ABDOMEN AND PELVIS WITH CONTRAST  Technique:  Multidetector CT imaging of the chest, abdomen and pelvis was performed following the standard protocol during bolus administration of intravenous contrast.  Contrast: 100 mL of Omnipaque 300 IV contrast  Comparison:  MRI of the lumbar spine performed 02/02/2013.  CT CHEST  Findings:  There is a lobulated spiculated mass within the inferior right upper lobe, measuring approximately 3.2 x 2.7 x 2.8 cm, near the right hilum.  This  is concerning for primary malignancy. Scattered tiny nodules are noted throughout both lungs, more prominent in the periphery, nonspecific in appearance.  Minimal emphysematous change is noted at the lung apices.  A somewhat larger nonspecific 6 mm nodule is noted within the left lower lobe (image 26 of 61).  No pleural effusion or pneumothorax is seen.  The mediastinum is grossly unremarkable in appearance.  No mediastinal lymphadenopathy is seen.  No pericardial effusion is identified.  Scattered calcification is noted along the thoracic aorta and proximal great vessels.  The thyroid gland is unremarkable in appearance, aside from minimal hypodensity within the left thyroid lobe.  No dominant mass is seen.  No axillary lymphadenopathy is appreciated.  A large complex soft tissue mass is noted along the right posterolateral chest wall, arising from the right fifth rib, with diffuse bony destruction and expansion.  This measures approximately 7.4 x 3.1 x 5.4 cm, and is concerning for malignancy; metastasis could have this appearance.  There appears to be a  lytic lesion within vertebral body T5, with associated mild fragmentation and loss of height at T5.  No definite additional osseous abnormalities are characterized, though evaluation for bony lytic metastatic disease is limited on CT.  IMPRESSION:  1.   Lobulated spiculated mass in the inferior right upper lobe, measuring 3.2 x 2.7 x 2.8 cm, near the right hilum.  This is concerning for primary bronchogenic malignancy. 2.  Scattered tiny nodules throughout both lungs, more prominent in the periphery, nonspecific in appearance.  Mildly larger nonspecific 6 mm nodule in the left lower lobe. 3.  Minimal emphysematous change at the lung apices. 4.  Large complex soft tissue mass along the right posterolateral chest wall, arising from the right fifth rib, with diffuse bony destruction and expansion.  This measures 7.4 x 3.1 x 5.4 cm, and is concerning for malignancy; metastasis could have this appearance. 5.  Lytic lesion within vertebral body T5, with mild fragmentation and loss of height at T5.  No additional osseous abnormalities seen along the thoracic spine, though evaluation for bony metastases is somewhat limited on CT.  CT ABDOMEN AND PELVIS  Findings:  Scattered numerous hypodensities within the liver are nonspecific but have the appearance of cysts; no definite suspicious hepatic lesions are characterized.  These measure up to 3.5 cm in size. The spleen is unremarkable in appearance.  There is suggestion of mild gallbladder wall thickening; the gallbladder is otherwise grossly unremarkable in appearance.  No definite stones are characterized.  There is a mildly heterogeneous 3.1 cm mass at the right adrenal gland; this is suspicious for metastatic disease.  The left adrenal gland is unremarkable in appearance.  The kidneys are unremarkable in appearance.  There is no evidence of hydronephrosis.  No renal or ureteral stones are seen.  No perinephric stranding is appreciated.  No free fluid is identified.  The  small bowel is unremarkable in appearance.  The stomach is within normal limits.  No acute vascular abnormalities are seen.  Relatively diffuse calcification is noted along the abdominal aorta and its branches, including at the origins of both renal arteries.  The appendix is normal in caliber, without evidence for appendicitis.  Mild scattered diverticulosis is noted along the cecum and ascending colon, and along the descending and proximal sigmoid colon.  The colon is otherwise unremarkable in appearance.  The bladder is significantly distended and grossly unremarkable in appearance.  The prostate is enlarged, measuring 5.4 cm in transverse dimension.  Small bilateral inguinal  hernias are seen, containing only fat.  A small right-sided hydrocele is incidentally noted.  No inguinal lymphadenopathy is seen.  Lytic lesions within vertebral bodies T12, L1, L2, L4, L5 and the upper sacrum are better characterized on recent MRI.  The largest lesions are within L2 and the sacrum; there is partial fragmentation of S1, with mass extending along the left sacral ala and into the overlying soft tissues. This appears to cause mass effect on the left-sided exiting nerve root at S1-S2.  IMPRESSION:  1.  3.1 cm mildly heterogeneous mass at the right adrenal gland is suspicious for metastatic disease.  2.  Lytic lesions within vertebral bodies T12, L1, L2, L4, L5 and the upper sacrum are better characterized on recent MRI.  Largest lesions are noted within L2 and the sacrum; partial fragmentation of S1, with mass extending along the left sacral ala and into the overlying soft tissues.  This appears to cause mass effect on the left-sided exiting nerve root at S1-S2. 3.  Scattered numerous hypodensities within the liver are nonspecific but appear to reflect cysts.  If there is significant clinical concern for hepatic metastases, further evaluation could be considered. 4.  Suggestion of mild gallbladder wall thickening; gallbladder  otherwise unremarkable in appearance. 5.  Relatively diffuse calcification along the abdominal aorta and its branches, including at the origins of the renal arteries. 6.  Mild scattered diverticulosis along the cecum and ascending colon, and along the descending and proximal sigmoid colon. 7.  Enlarged prostate noted. 8.  Small bilateral inguinal hernias, containing only fat. 9.  Small right-sided hydrocele incidentally seen.   Original Report Authenticated By: Tonia Ghent, M.D.    Ct Biopsy 02/10/2013  *  IMPRESSION:  Technically successful CT-guided core biopsy of expansile soft tissue mass arising from the right fifth rib.  Signed,  Sterling Big, MD Vascular & Interventional Radiologist [facility]   Original Report Authenticated By: Malachy Moan, M.D.     Scheduled Meds: . amLODipine  10 mg Oral Daily  . dexamethasone  6 mg Oral Q6H  . docusate sodium  100 mg Oral BID  . fentaNYL      . losartan  100 mg Oral Daily

## 2013-02-10 NOTE — Consult Note (Signed)
PULMONARY  / CRITICAL CARE MEDICINE  Name: Gabriel [name] MRN: [id number] DOB: 1941-06-16    ADMISSION DATE:  02/09/2013 CONSULTATION DATE:  02/10/2013   REFERRING MD :  Therisa Doyne  CHIEF COMPLAINT:  Back pain  BRIEF PATIENT DESCRIPTION:  72 yo former smoker with progressive back pain and found to have multiple lumbo-sacral spine metastatic lesions, and Rt upper lobe and Rt chest wall masses.  PCCM consulted to assess for tissue sampling of lesions.  SIGNIFICANT EVENTS:  STUDIES:  4/16 CT chest >> 3.2 cm RUL mass, scattered smaller b/l nodules, apical emphysema, 7.4 cm mass Rt posterolateral chest wall with destruction of 5th rib, lytic lesion in T5  HISTORY OF PRESENT ILLNESS:   72 yo male developed back pain while lifting heavy objects in December 2013.  He was treated with pain medication and cortisone injection with minimal improvement.  His pain progressed, and he started to get numbness in his left foot and shooting electric pain down his left leg.  He had difficulty walking.  As a result he was seen by doctors in [place].  He had MRI of his lumbar spine which showed lytic lesions in T12 through S1 with compression on S1-S2 nerve root.  He had CT chest which showed mass in RUL and Rt chest wall mass with additional smaller scattered nodules.  There was no significant lymphadenopathy.    He quit smoking several years ago.  He used to work in [employer], fashion/clothing.  He has lost about 10 lbs over the past 1 month.  He denies fever, cough, chest pain, sputum, hemoptysis, or gland swelling.  PAST MEDICAL HISTORY :  Past Medical History  Diagnosis Date  . Cancer   . Hypertension   . Glaucoma    Past Surgical History  Procedure Laterality Date  . Cataract extraction, bilateral Bilateral 2009   Prior to Admission medications   Medication Sig Start Date End Date Taking? Authorizing Provider  Flaxseed, Linseed, (FLAX SEED OIL PO) Take 1 capsule by mouth daily.   Yes Historical Provider, MD   ibuprofen (ADVIL,MOTRIN) 200 MG tablet Take 400 mg by mouth every 8 (eight) hours as needed for pain.   Yes Historical Provider, MD  Multiple Vitamin (MULTIVITAMIN WITH MINERALS) TABS Take 1 tablet by mouth daily.   Yes Historical Provider, MD  oxyCODONE-acetaminophen (PERCOCET) 10-325 MG per tablet Take 0.5-1 tablets by mouth every 6 (six) hours as needed for pain.   Yes Historical Provider, MD   Allergies  Allergen Reactions  . Hydrocodone Rash    FAMILY HISTORY:  Family History  Problem Relation Age of Onset  . Heart failure Mother   . Stroke Father   . Heart disease Brother    SOCIAL HISTORY:  reports that he has quit smoking. He does not have any smokeless tobacco history on file. He reports that he does not drink alcohol or use illicit drugs.  REVIEW OF SYSTEMS:   Negative except above  SUBJECTIVE:   VITAL SIGNS: Temp:  [98.2 F (36.8 C)-98.8 F (37.1 C)] 98.2 F (36.8 C) (04/17 0536) Pulse Rate:  [79-87] 79 (04/17 0536) Resp:  [14-15] 14 (04/17 0536) BP: (131-190)/(72-97) 131/73 mmHg (04/17 0536) SpO2:  [97 %-99 %] 98 % (04/17 0536) Weight:  [180 lb (81.647 kg)] 180 lb (81.647 kg) (04/17 0135)  PHYSICAL EXAMINATION: General:  No distress Neuro:  Alert, normal strength, CN intact HEENT:  PERRLA, no sinus tenderness, no oral exudate, no LAN Cardiovascular:  s1s2 regular, no murmur Lungs:  No  wheeze/rales/dullness Abdomen:  Soft, non-tender, no masses Musculoskeletal:  No edema Skin:  No rashes   Recent Labs Lab 02/09/13 2025 02/10/13 0345  NA 136 134*  K 4.5 3.9  CL 98 98  CO2 29 27  BUN 13 11  CREATININE 0.84 0.79  GLUCOSE 122* 99    Recent Labs Lab 02/09/13 2025 02/10/13 0345  HGB 14.4 12.8*  HCT 41.9 37.6*  WBC 15.1* 12.7*  PLT 220 188   Ct Chest W Contrast  02/09/2013  *RADIOLOGY REPORT*  Clinical Data:  Weakness and lower back pain.  Lung mass noted on chest radiograph.  Metastases suspected on lumbar spine MRI.  CT CHEST, ABDOMEN AND  PELVIS WITH CONTRAST  Technique:  Multidetector CT imaging of the chest, abdomen and pelvis was performed following the standard protocol during bolus administration of intravenous contrast.  Contrast: 100 mL of Omnipaque 300 IV contrast  Comparison:  MRI of the lumbar spine performed 02/02/2013.  CT CHEST  Findings:  There is a lobulated spiculated mass within the inferior right upper lobe, measuring approximately 3.2 x 2.7 x 2.8 cm, near the right hilum.  This is concerning for primary malignancy. Scattered tiny nodules are noted throughout both lungs, more prominent in the periphery, nonspecific in appearance.  Minimal emphysematous change is noted at the lung apices.  A somewhat larger nonspecific 6 mm nodule is noted within the left lower lobe (image 26 of 61).  No pleural effusion or pneumothorax is seen.  The mediastinum is grossly unremarkable in appearance.  No mediastinal lymphadenopathy is seen.  No pericardial effusion is identified.  Scattered calcification is noted along the thoracic aorta and proximal great vessels.  The thyroid gland is unremarkable in appearance, aside from minimal hypodensity within the left thyroid lobe.  No dominant mass is seen.  No axillary lymphadenopathy is appreciated.  A large complex soft tissue mass is noted along the right posterolateral chest wall, arising from the right fifth rib, with diffuse bony destruction and expansion.  This measures approximately 7.4 x 3.1 x 5.4 cm, and is concerning for malignancy; metastasis could have this appearance.  There appears to be a lytic lesion within vertebral body T5, with associated mild fragmentation and loss of height at T5.  No definite additional osseous abnormalities are characterized, though evaluation for bony lytic metastatic disease is limited on CT.  IMPRESSION:  1.   Lobulated spiculated mass in the inferior right upper lobe, measuring 3.2 x 2.7 x 2.8 cm, near the right hilum.  This is concerning for primary  bronchogenic malignancy. 2.  Scattered tiny nodules throughout both lungs, more prominent in the periphery, nonspecific in appearance.  Mildly larger nonspecific 6 mm nodule in the left lower lobe. 3.  Minimal emphysematous change at the lung apices. 4.  Large complex soft tissue mass along the right posterolateral chest wall, arising from the right fifth rib, with diffuse bony destruction and expansion.  This measures 7.4 x 3.1 x 5.4 cm, and is concerning for malignancy; metastasis could have this appearance. 5.  Lytic lesion within vertebral body T5, with mild fragmentation and loss of height at T5.  No additional osseous abnormalities seen along the thoracic spine, though evaluation for bony metastases is somewhat limited on CT.  CT ABDOMEN AND PELVIS  Findings:  Scattered numerous hypodensities within the liver are nonspecific but have the appearance of cysts; no definite suspicious hepatic lesions are characterized.  These measure up to 3.5 cm in size. The spleen is unremarkable in  appearance.  There is suggestion of mild gallbladder wall thickening; the gallbladder is otherwise grossly unremarkable in appearance.  No definite stones are characterized.  There is a mildly heterogeneous 3.1 cm mass at the right adrenal gland; this is suspicious for metastatic disease.  The left adrenal gland is unremarkable in appearance.  The kidneys are unremarkable in appearance.  There is no evidence of hydronephrosis.  No renal or ureteral stones are seen.  No perinephric stranding is appreciated.  No free fluid is identified.  The small bowel is unremarkable in appearance.  The stomach is within normal limits.  No acute vascular abnormalities are seen.  Relatively diffuse calcification is noted along the abdominal aorta and its branches, including at the origins of both renal arteries.  The appendix is normal in caliber, without evidence for appendicitis.  Mild scattered diverticulosis is noted along the cecum and ascending  colon, and along the descending and proximal sigmoid colon.  The colon is otherwise unremarkable in appearance.  The bladder is significantly distended and grossly unremarkable in appearance.  The prostate is enlarged, measuring 5.4 cm in transverse dimension.  Small bilateral inguinal hernias are seen, containing only fat.  A small right-sided hydrocele is incidentally noted.  No inguinal lymphadenopathy is seen.  Lytic lesions within vertebral bodies T12, L1, L2, L4, L5 and the upper sacrum are better characterized on recent MRI.  The largest lesions are within L2 and the sacrum; there is partial fragmentation of S1, with mass extending along the left sacral ala and into the overlying soft tissues. This appears to cause mass effect on the left-sided exiting nerve root at S1-S2.  IMPRESSION:  1.  3.1 cm mildly heterogeneous mass at the right adrenal gland is suspicious for metastatic disease.  2.  Lytic lesions within vertebral bodies T12, L1, L2, L4, L5 and the upper sacrum are better characterized on recent MRI.  Largest lesions are noted within L2 and the sacrum; partial fragmentation of S1, with mass extending along the left sacral ala and into the overlying soft tissues.  This appears to cause mass effect on the left-sided exiting nerve root at S1-S2. 3.  Scattered numerous hypodensities within the liver are nonspecific but appear to reflect cysts.  If there is significant clinical concern for hepatic metastases, further evaluation could be considered. 4.  Suggestion of mild gallbladder wall thickening; gallbladder otherwise unremarkable in appearance. 5.  Relatively diffuse calcification along the abdominal aorta and its branches, including at the origins of the renal arteries. 6.  Mild scattered diverticulosis along the cecum and ascending colon, and along the descending and proximal sigmoid colon. 7.  Enlarged prostate noted. 8.  Small bilateral inguinal hernias, containing only fat. 9.  Small right-sided  hydrocele incidentally seen.   Original Report Authenticated By: Tonia Ghent, M.D.    Ct Abdomen Pelvis W Contrast  02/09/2013  *RADIOLOGY REPORT*  Clinical Data:  Weakness and lower back pain.  Lung mass noted on chest radiograph.  Metastases suspected on lumbar spine MRI.  CT CHEST, ABDOMEN AND PELVIS WITH CONTRAST  Technique:  Multidetector CT imaging of the chest, abdomen and pelvis was performed following the standard protocol during bolus administration of intravenous contrast.  Contrast: 100 mL of Omnipaque 300 IV contrast  Comparison:  MRI of the lumbar spine performed 02/02/2013.  CT CHEST  Findings:  There is a lobulated spiculated mass within the inferior right upper lobe, measuring approximately 3.2 x 2.7 x 2.8 cm, near the right hilum.  This is  concerning for primary malignancy. Scattered tiny nodules are noted throughout both lungs, more prominent in the periphery, nonspecific in appearance.  Minimal emphysematous change is noted at the lung apices.  A somewhat larger nonspecific 6 mm nodule is noted within the left lower lobe (image 26 of 61).  No pleural effusion or pneumothorax is seen.  The mediastinum is grossly unremarkable in appearance.  No mediastinal lymphadenopathy is seen.  No pericardial effusion is identified.  Scattered calcification is noted along the thoracic aorta and proximal great vessels.  The thyroid gland is unremarkable in appearance, aside from minimal hypodensity within the left thyroid lobe.  No dominant mass is seen.  No axillary lymphadenopathy is appreciated.  A large complex soft tissue mass is noted along the right posterolateral chest wall, arising from the right fifth rib, with diffuse bony destruction and expansion.  This measures approximately 7.4 x 3.1 x 5.4 cm, and is concerning for malignancy; metastasis could have this appearance.  There appears to be a lytic lesion within vertebral body T5, with associated mild fragmentation and loss of height at T5.  No  definite additional osseous abnormalities are characterized, though evaluation for bony lytic metastatic disease is limited on CT.  IMPRESSION:  1.   Lobulated spiculated mass in the inferior right upper lobe, measuring 3.2 x 2.7 x 2.8 cm, near the right hilum.  This is concerning for primary bronchogenic malignancy. 2.  Scattered tiny nodules throughout both lungs, more prominent in the periphery, nonspecific in appearance.  Mildly larger nonspecific 6 mm nodule in the left lower lobe. 3.  Minimal emphysematous change at the lung apices. 4.  Large complex soft tissue mass along the right posterolateral chest wall, arising from the right fifth rib, with diffuse bony destruction and expansion.  This measures 7.4 x 3.1 x 5.4 cm, and is concerning for malignancy; metastasis could have this appearance. 5.  Lytic lesion within vertebral body T5, with mild fragmentation and loss of height at T5.  No additional osseous abnormalities seen along the thoracic spine, though evaluation for bony metastases is somewhat limited on CT.  CT ABDOMEN AND PELVIS  Findings:  Scattered numerous hypodensities within the liver are nonspecific but have the appearance of cysts; no definite suspicious hepatic lesions are characterized.  These measure up to 3.5 cm in size. The spleen is unremarkable in appearance.  There is suggestion of mild gallbladder wall thickening; the gallbladder is otherwise grossly unremarkable in appearance.  No definite stones are characterized.  There is a mildly heterogeneous 3.1 cm mass at the right adrenal gland; this is suspicious for metastatic disease.  The left adrenal gland is unremarkable in appearance.  The kidneys are unremarkable in appearance.  There is no evidence of hydronephrosis.  No renal or ureteral stones are seen.  No perinephric stranding is appreciated.  No free fluid is identified.  The small bowel is unremarkable in appearance.  The stomach is within normal limits.  No acute vascular  abnormalities are seen.  Relatively diffuse calcification is noted along the abdominal aorta and its branches, including at the origins of both renal arteries.  The appendix is normal in caliber, without evidence for appendicitis.  Mild scattered diverticulosis is noted along the cecum and ascending colon, and along the descending and proximal sigmoid colon.  The colon is otherwise unremarkable in appearance.  The bladder is significantly distended and grossly unremarkable in appearance.  The prostate is enlarged, measuring 5.4 cm in transverse dimension.  Small bilateral inguinal hernias  are seen, containing only fat.  A small right-sided hydrocele is incidentally noted.  No inguinal lymphadenopathy is seen.  Lytic lesions within vertebral bodies T12, L1, L2, L4, L5 and the upper sacrum are better characterized on recent MRI.  The largest lesions are within L2 and the sacrum; there is partial fragmentation of S1, with mass extending along the left sacral ala and into the overlying soft tissues. This appears to cause mass effect on the left-sided exiting nerve root at S1-S2.  IMPRESSION:  1.  3.1 cm mildly heterogeneous mass at the right adrenal gland is suspicious for metastatic disease.  2.  Lytic lesions within vertebral bodies T12, L1, L2, L4, L5 and the upper sacrum are better characterized on recent MRI.  Largest lesions are noted within L2 and the sacrum; partial fragmentation of S1, with mass extending along the left sacral ala and into the overlying soft tissues.  This appears to cause mass effect on the left-sided exiting nerve root at S1-S2. 3.  Scattered numerous hypodensities within the liver are nonspecific but appear to reflect cysts.  If there is significant clinical concern for hepatic metastases, further evaluation could be considered. 4.  Suggestion of mild gallbladder wall thickening; gallbladder otherwise unremarkable in appearance. 5.  Relatively diffuse calcification along the abdominal aorta  and its branches, including at the origins of the renal arteries. 6.  Mild scattered diverticulosis along the cecum and ascending colon, and along the descending and proximal sigmoid colon. 7.  Enlarged prostate noted. 8.  Small bilateral inguinal hernias, containing only fat. 9.  Small right-sided hydrocele incidentally seen.   Original Report Authenticated By: Tonia Ghent, M.D.     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   He needs tissue diagnosis.   Plan: -will ask IR to assess for CT guided biopsy of Rt chest wall mass -if IR unable to get bx from chest wall mass, will then need to arrange for bronchoscopy -will likely need emergent XRT to lumbosacral spine lesions due to nerve compression; continue decadron per primary team -pain control per primary team  [name], MD [facility] Pulmonary/Critical Care 02/10/2013, 8:43 AM Pager:  [phone] After 3pm call: [phone]

## 2013-02-10 NOTE — Progress Notes (Signed)
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  HISTORY OF PRESENT ILLNESS::Gabriel [name] is a 72 y.o. male who presented with back pain and left leg/foot numbness. Work up showed a right-sided lung mass and multiple areas of cancer spread in the spine, ribs, and liver. 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  The patient was instructed to proceed directly to [facility] long emergency room for admission  for biopsy and further evaluation. CT chest/Abd/pelvis done on 4/16 and was reviewed by me: this showed lobulated spiculated mass in the inferior right upper lobe, measuring 3.2 x 2.7 x 2.8 cm, near the right hilum. Patient clinically feels well. He reports no respiratory symptoms. He report weakness in in his left leg and inability to be in a sitting position.    PREVIOUS RADIATION THERAPY: No  PAST MEDICAL HISTORY:  has a past medical history of Cancer; Hypertension; and Glaucoma.    PAST SURGICAL HISTORY: Past Surgical History  Procedure Laterality Date  . Cataract extraction, bilateral Bilateral 2009    FAMILY HISTORY: family history includes Heart disease in his brother; Heart failure in his mother; and Stroke in his father.  SOCIAL HISTORY:  reports that he has quit smoking. He does not have any smokeless tobacco history on file. He reports that he does not drink alcohol or use illicit drugs.  ALLERGIES: Hydrocodone  MEDICATIONS:  No current facility-administered medications for this  encounter.   No current outpatient prescriptions on file.   Facility-Administered Medications Ordered in Other Encounters  Medication Dose Route Frequency Provider Last Rate Last Dose  . 0.9 %  sodium chloride infusion  250 mL Intravenous PRN Therisa Doyne, MD      . acetaminophen (TYLENOL) tablet 650 mg  650 mg Oral Q6H PRN Therisa Doyne, MD       Or  . acetaminophen (TYLENOL) suppository 650 mg  650 mg Rectal Q6H PRN Therisa Doyne, MD      . dexamethasone (DECADRON) tablet 6 mg  6 mg Oral Q6H Therisa Doyne, MD   6 mg at 02/10/13 0548  . docusate sodium (COLACE) capsule 100 mg  100 mg Oral BID Therisa Doyne, MD      . HYDROcodone-acetaminophen (NORCO/VICODIN) 5-325 MG per tablet 1-2 tablet  1-2 tablet Oral Q4H PRN Therisa Doyne, MD   2 tablet at 02/10/13 0253  . morphine 2 MG/ML injection 2-4 mg  2-4 mg Intravenous Q4H PRN Therisa Doyne, MD      . ondansetron (ZOFRAN) tablet 4 mg  4 mg Oral Q6H PRN Therisa Doyne, MD       Or  . ondansetron (ZOFRAN) injection 4 mg  4 mg Intravenous Q6H PRN Therisa Doyne, MD      . sodium chloride 0.9 % injection 3 mL  3 mL Intravenous Q12H Anastassia Doutova, MD      . sodium chloride 0.9 % injection 3 mL  3 mL Intravenous PRN Therisa Doyne, MD        REVIEW OF SYSTEMS:  A 15 point review of systems  is documented in the electronic medical record. This was obtained by the nursing staff. However, I reviewed this with the patient to discuss relevant findings and make appropriate changes.  Pertinent items are noted in HPI.   PHYSICAL EXAM:   Filed Vitals:    02/10/13 0536   BP:  131/73   Pulse:  79   Temp:  98.2 F (36.8 C)   Resp:  14   Filed Weights    02/10/13 0135   Weight:  180 lb (81.647 kg)   Per medical oncology  General: 48 -year-old white male in no acute distress A. and O. x3 well-developed, well-nourished.  HEENT: Normocephalic, atraumatic, PERRLA. Sclerae anicteric. Oral cavity without  thrush or lesions.  NECK:supple. no thyromegaly, no cervical or supraclavicular adenopathy  LUNGS: clear bilaterally,a distant sounds are present . No wheezing, rhonchi or rales. No axillary masses.  BREASTS: not examined.  CARDIOVASCULAR: regular rate and rhythm, no murmur , rubs or gallops  ABDOMEN: soft nontender , bowel sounds x4. No HSM. No masses palpable.  GU/rectal: deferred.  EXTREMITIES: no clubbing cyanosis or edema. No bruising or petechial rash  MUSCULOSKELETAL: no spinal tenderness is reproducible.  NEURO: remarkable for decreased strength in the left lower extremity. No Horner's.  He did have decrease motor strength in his left leg. He did have calf atrophy that was noted as well.  The rest of his exam is un remarkable.    LABORATORY DATA:  Lab Results  Component Value Date   WBC 12.7* 02/10/2013   HGB 12.8* 02/10/2013   HCT 37.6* 02/10/2013   MCV 90.4 02/10/2013   PLT 188 02/10/2013   Lab Results  Component Value Date   NA 134* 02/10/2013   K 3.9 02/10/2013   CL 98 02/10/2013   CO2 27 02/10/2013   Lab Results  Component Value Date   ALT 26 02/10/2013   AST 32 02/10/2013   ALKPHOS 75 02/10/2013   BILITOT 0.5 02/10/2013     RADIOGRAPHY: Ct Chest W Contrast  02/09/2013  *RADIOLOGY REPORT*  Clinical Data:  Weakness and lower back pain.  Lung mass noted on chest radiograph.  Metastases suspected on lumbar spine MRI.  CT CHEST, ABDOMEN AND PELVIS WITH CONTRAST  Technique:  Multidetector CT imaging of the chest, abdomen and pelvis was performed following the standard protocol during bolus administration of intravenous contrast.  Contrast: 100 mL of Omnipaque 300 IV contrast  Comparison:  MRI of the lumbar spine performed 02/02/2013.  CT CHEST  Findings:  There is a lobulated spiculated mass within the inferior right upper lobe, measuring approximately 3.2 x 2.7 x 2.8 cm, near the right hilum.  This is concerning for primary malignancy. Scattered tiny nodules are noted throughout  both lungs, more prominent in the periphery, nonspecific in appearance.  Minimal emphysematous change is noted at the lung apices.  A somewhat larger nonspecific 6 mm nodule is noted within the left lower lobe (image 26 of 61).  No pleural effusion or pneumothorax is seen.  The mediastinum is grossly unremarkable in appearance.  No mediastinal lymphadenopathy is seen.  No pericardial effusion is identified.  Scattered calcification is noted along the thoracic aorta and proximal great vessels.  The thyroid gland is unremarkable in appearance, aside from minimal hypodensity within the left thyroid lobe.  No dominant mass is seen.  No axillary lymphadenopathy is appreciated.  A large complex soft tissue mass is noted along the right posterolateral chest wall, arising from the right fifth rib, with  diffuse bony destruction and expansion.  This measures approximately 7.4 x 3.1 x 5.4 cm, and is concerning for malignancy; metastasis could have this appearance.  There appears to be a lytic lesion within vertebral body T5, with associated mild fragmentation and loss of height at T5.  No definite additional osseous abnormalities are characterized, though evaluation for bony lytic metastatic disease is limited on CT.  IMPRESSION:  1.   Lobulated spiculated mass in the inferior right upper lobe, measuring 3.2 x 2.7 x 2.8 cm, near the right hilum.  This is concerning for primary bronchogenic malignancy. 2.  Scattered tiny nodules throughout both lungs, more prominent in the periphery, nonspecific in appearance.  Mildly larger nonspecific 6 mm nodule in the left lower lobe. 3.  Minimal emphysematous change at the lung apices. 4.  Large complex soft tissue mass along the right posterolateral chest wall, arising from the right fifth rib, with diffuse bony destruction and expansion.  This measures 7.4 x 3.1 x 5.4 cm, and is concerning for malignancy; metastasis could have this appearance. 5.  Lytic lesion within vertebral body T5,  with mild fragmentation and loss of height at T5.  No additional osseous abnormalities seen along the thoracic spine, though evaluation for bony metastases is somewhat limited on CT.  CT ABDOMEN AND PELVIS  Findings:  Scattered numerous hypodensities within the liver are nonspecific but have the appearance of cysts; no definite suspicious hepatic lesions are characterized.  These measure up to 3.5 cm in size. The spleen is unremarkable in appearance.  There is suggestion of mild gallbladder wall thickening; the gallbladder is otherwise grossly unremarkable in appearance.  No definite stones are characterized.  There is a mildly heterogeneous 3.1 cm mass at the right adrenal gland; this is suspicious for metastatic disease.  The left adrenal gland is unremarkable in appearance.  The kidneys are unremarkable in appearance.  There is no evidence of hydronephrosis.  No renal or ureteral stones are seen.  No perinephric stranding is appreciated.  No free fluid is identified.  The small bowel is unremarkable in appearance.  The stomach is within normal limits.  No acute vascular abnormalities are seen.  Relatively diffuse calcification is noted along the abdominal aorta and its branches, including at the origins of both renal arteries.  The appendix is normal in caliber, without evidence for appendicitis.  Mild scattered diverticulosis is noted along the cecum and ascending colon, and along the descending and proximal sigmoid colon.  The colon is otherwise unremarkable in appearance.  The bladder is significantly distended and grossly unremarkable in appearance.  The prostate is enlarged, measuring 5.4 cm in transverse dimension.  Small bilateral inguinal hernias are seen, containing only fat.  A small right-sided hydrocele is incidentally noted.  No inguinal lymphadenopathy is seen.  Lytic lesions within vertebral bodies T12, L1, L2, L4, L5 and the upper sacrum are better characterized on recent MRI.  The largest lesions  are within L2 and the sacrum; there is partial fragmentation of S1, with mass extending along the left sacral ala and into the overlying soft tissues. This appears to cause mass effect on the left-sided exiting nerve root at S1-S2.  IMPRESSION:  1.  3.1 cm mildly heterogeneous mass at the right adrenal gland is suspicious for metastatic disease.  2.  Lytic lesions within vertebral bodies T12, L1, L2, L4, L5 and the upper sacrum are better characterized on recent MRI.  Largest lesions are noted within L2 and the sacrum; partial fragmentation of S1,  with mass extending along the left sacral ala and into the overlying soft tissues.  This appears to cause mass effect on the left-sided exiting nerve root at S1-S2. 3.  Scattered numerous hypodensities within the liver are nonspecific but appear to reflect cysts.  If there is significant clinical concern for hepatic metastases, further evaluation could be considered. 4.  Suggestion of mild gallbladder wall thickening; gallbladder otherwise unremarkable in appearance. 5.  Relatively diffuse calcification along the abdominal aorta and its branches, including at the origins of the renal arteries. 6.  Mild scattered diverticulosis along the cecum and ascending colon, and along the descending and proximal sigmoid colon. 7.  Enlarged prostate noted. 8.  Small bilateral inguinal hernias, containing only fat. 9.  Small right-sided hydrocele incidentally seen.   Original Report Authenticated By: Tonia Ghent, M.D.    Ct Abdomen Pelvis W Contrast  02/09/2013  *RADIOLOGY REPORT*  Clinical Data:  Weakness and lower back pain.  Lung mass noted on chest radiograph.  Metastases suspected on lumbar spine MRI.  CT CHEST, ABDOMEN AND PELVIS WITH CONTRAST  Technique:  Multidetector CT imaging of the chest, abdomen and pelvis was performed following the standard protocol during bolus administration of intravenous contrast.  Contrast: 100 mL of Omnipaque 300 IV contrast  Comparison:  MRI  of the lumbar spine performed 02/02/2013.  CT CHEST  Findings:  There is a lobulated spiculated mass within the inferior right upper lobe, measuring approximately 3.2 x 2.7 x 2.8 cm, near the right hilum.  This is concerning for primary malignancy. Scattered tiny nodules are noted throughout both lungs, more prominent in the periphery, nonspecific in appearance.  Minimal emphysematous change is noted at the lung apices.  A somewhat larger nonspecific 6 mm nodule is noted within the left lower lobe (image 26 of 61).  No pleural effusion or pneumothorax is seen.  The mediastinum is grossly unremarkable in appearance.  No mediastinal lymphadenopathy is seen.  No pericardial effusion is identified.  Scattered calcification is noted along the thoracic aorta and proximal great vessels.  The thyroid gland is unremarkable in appearance, aside from minimal hypodensity within the left thyroid lobe.  No dominant mass is seen.  No axillary lymphadenopathy is appreciated.  A large complex soft tissue mass is noted along the right posterolateral chest wall, arising from the right fifth rib, with diffuse bony destruction and expansion.  This measures approximately 7.4 x 3.1 x 5.4 cm, and is concerning for malignancy; metastasis could have this appearance.  There appears to be a lytic lesion within vertebral body T5, with associated mild fragmentation and loss of height at T5.  No definite additional osseous abnormalities are characterized, though evaluation for bony lytic metastatic disease is limited on CT.  IMPRESSION:  1.   Lobulated spiculated mass in the inferior right upper lobe, measuring 3.2 x 2.7 x 2.8 cm, near the right hilum.  This is concerning for primary bronchogenic malignancy. 2.  Scattered tiny nodules throughout both lungs, more prominent in the periphery, nonspecific in appearance.  Mildly larger nonspecific 6 mm nodule in the left lower lobe. 3.  Minimal emphysematous change at the lung apices. 4.  Large  complex soft tissue mass along the right posterolateral chest wall, arising from the right fifth rib, with diffuse bony destruction and expansion.  This measures 7.4 x 3.1 x 5.4 cm, and is concerning for malignancy; metastasis could have this appearance. 5.  Lytic lesion within vertebral body T5, with mild fragmentation and loss of height  at T5.  No additional osseous abnormalities seen along the thoracic spine, though evaluation for bony metastases is somewhat limited on CT.  CT ABDOMEN AND PELVIS  Findings:  Scattered numerous hypodensities within the liver are nonspecific but have the appearance of cysts; no definite suspicious hepatic lesions are characterized.  These measure up to 3.5 cm in size. The spleen is unremarkable in appearance.  There is suggestion of mild gallbladder wall thickening; the gallbladder is otherwise grossly unremarkable in appearance.  No definite stones are characterized.  There is a mildly heterogeneous 3.1 cm mass at the right adrenal gland; this is suspicious for metastatic disease.  The left adrenal gland is unremarkable in appearance.  The kidneys are unremarkable in appearance.  There is no evidence of hydronephrosis.  No renal or ureteral stones are seen.  No perinephric stranding is appreciated.  No free fluid is identified.  The small bowel is unremarkable in appearance.  The stomach is within normal limits.  No acute vascular abnormalities are seen.  Relatively diffuse calcification is noted along the abdominal aorta and its branches, including at the origins of both renal arteries.  The appendix is normal in caliber, without evidence for appendicitis.  Mild scattered diverticulosis is noted along the cecum and ascending colon, and along the descending and proximal sigmoid colon.  The colon is otherwise unremarkable in appearance.  The bladder is significantly distended and grossly unremarkable in appearance.  The prostate is enlarged, measuring 5.4 cm in transverse dimension.   Small bilateral inguinal hernias are seen, containing only fat.  A small right-sided hydrocele is incidentally noted.  No inguinal lymphadenopathy is seen.  Lytic lesions within vertebral bodies T12, L1, L2, L4, L5 and the upper sacrum are better characterized on recent MRI.  The largest lesions are within L2 and the sacrum; there is partial fragmentation of S1, with mass extending along the left sacral ala and into the overlying soft tissues. This appears to cause mass effect on the left-sided exiting nerve root at S1-S2.  IMPRESSION:  1.  3.1 cm mildly heterogeneous mass at the right adrenal gland is suspicious for metastatic disease.  2.  Lytic lesions within vertebral bodies T12, L1, L2, L4, L5 and the upper sacrum are better characterized on recent MRI.  Largest lesions are noted within L2 and the sacrum; partial fragmentation of S1, with mass extending along the left sacral ala and into the overlying soft tissues.  This appears to cause mass effect on the left-sided exiting nerve root at S1-S2. 3.  Scattered numerous hypodensities within the liver are nonspecific but appear to reflect cysts.  If there is significant clinical concern for hepatic metastases, further evaluation could be considered. 4.  Suggestion of mild gallbladder wall thickening; gallbladder otherwise unremarkable in appearance. 5.  Relatively diffuse calcification along the abdominal aorta and its branches, including at the origins of the renal arteries. 6.  Mild scattered diverticulosis along the cecum and ascending colon, and along the descending and proximal sigmoid colon. 7.  Enlarged prostate noted. 8.  Small bilateral inguinal hernias, containing only fat. 9.  Small right-sided hydrocele incidentally seen.   Original Report Authenticated By: Tonia Ghent, M.D.      IMPRESSION: This patient is a very nice 72 year old gentleman who presented with left leg symptoms leading to discovery of a sacral mass compressing nerve roots.  Ultimately, a sacral mass appears to represent a metastatic deposit from primary lung cancer with multiple other sites of metastatic involvement. However, there is no  pathology confirmation of malignancy as yet.  PLAN: At this point, the patient does have some neurologic symptoms related to sacral nerve route involvement. However, he is ambulate and the radiographic appearance of the spinal canal at the sacral level does show patency. I would agree with the current plan to proceed with CT-guided biopsy. Once the biopsy confirms diagnosis, the patient does have widespread lung cancer, suspected, we will urgently proceed with radiation to the sacrum. Of note, patient lives in [place] and would prefer to be treated in our satellite office today there. As it happens, I am scheduled to cover the [facility] satellite office Monday. Accordingly, following biopsy it may be prudent to proceed with brain MRI during his hospitalization at [facility] long and then discharge the patient home for CT simulation in [place] on Monday, April 21.    Today, I talked to the patient and family about the findings and work-up thus far.  We discussed the natural history of metastatic lung cancer and general treatment, highlighting the role or radiotherapy in the management.  We discussed the available radiation techniques, and focused on the details of logistics and delivery.  We reviewed the anticipated acute and late sequelae associated with radiation to the sacrum in this setting.  The patient was encouraged to ask questions that I answered to the best of my ability.  The patient would like to proceed with biopsy. If the biopsy shows malignancy, the patient is willing to proceed with radiation and will be scheduled for CT simulation in our [place] office.  I spent 45 minutes minutes face to face with the patient and more than 50% of that time was spent in counseling and/or coordination of care.     ------------------------------------------------  Artist Pais. Kathrynn Running, M.D.

## 2013-02-10 NOTE — ED Notes (Signed)
Hospitalist at bedside 

## 2013-02-10 NOTE — Consult Note (Signed)
Patient sen and examined. Please see note by Trixie Dredge PA-C.  This is a 72 year old man with smoking history presented with back pain and left leg/foot numbness. Work up showed lung mass and multiple areas of cancer spread in the spine.  MRI from [facility] showed epidural tumor at S1.  CT chest/Abd/pelvis done on 4/16 and was reviewed: this showed lobulated spiculated mass in the inferior right upper lobe, measuring 3.2 x 2.7 x 2.8 cm, near the right hilum.  Patient was transferred to [facility] for biopsy and start radiation.   Patient clinically feels well. He reports no respiratory symptoms. He report weakness in in his left leg and inability to be in a setting position.  He did have decrease motor strength in his left leg. He did have calf atrophy that was noted as well.  The rest of his exam is un remarkable.   Impression:  72 year old with :  1. Advanced lung cancer.  2. Multiple areas of bone involvement including S1- S2. 3. Left lefg weakness.  Recommendation:   1. I agree with the biopsy ASAP. 2. Agree with Decadron.  3. Radiation oncology consult. I spoke with Dr. Kathrynn Running.  4. Upon discharge, he will follow up back with Dr. Ubaldo Glassing for continued oncology follow up.

## 2013-02-10 NOTE — Consult Note (Signed)
[facility]  [phone]     ONCOLOGY  HOSPITAL CONSULTATION NOTE  Gabriel [name]                                MR#: [id number]  DOB: September 15, 1941                       CSN#: [id number]  Referring MD: Dr. Emmie [name]  Reason for Consult: Lung Cancer   GMW:NUUVO Gabriel [name] is a 72 y.o. 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 In review, he  admitted on 02/09/13 via ED, after initial consultation with Dr. Ubaldo [name] at [facility], for further workup, including tissue biopsy.  Per chart report, he had a 3 month history of progressive left lower extremity pain and weakness, worse with ambulation. MRI of the L spine at [facility] revealed a 2.9 cm L2 lesion, and S1 replaced by tumor with extension of tumor into the epidural space posterior involvement of the S1 nerve root. Bilateral iliac bone lesions were noted as well. The patient was seen by Dr. Ubaldo [name] whose notes are not available for review.he was referred for bone biopsy at this facility, for tissue diagnosis. In the interim, a CT of the chest with contrast on 02/09/2013 was remarkable for a lobulated spiculated mass in the inferior right upper lobe measuring 3.2 x 2.7 x 2.8 cm near the right hilum.scattered tiny nodules were noted as well throughout both lungs more prominent in the periphery.a 6 mm nodule was notably in the left lower lobe. No pleural effusion or pneumothorax was noted. No mediastinal lymphadenopathy. No pericardial effusion. No axillary lymphadenopathy.large complex soft tissue mass was noted along the right posterolateral chest wall, arising from the right fifth rib, with diffuse bony distraction and expansion measuring 7.4 x 3.1 x 5.4 cm. In late the T5 vertebral body lesion was seen.  CT of the abdomen and pelvis with contrast on the same day, showed a 3.1 cm mildly heterogeneous mass at the right adrenal gland suspicious for metastatic  disease, as well as light lesions within the vertebral bodies of  T12,,L1, L2 ,L4 and L5 in the upper sacrum priorly seen by MRI. Largest lesions are noted within L2 and the sacrum; partial fragmentation of S1, with mass extending along the left sacral ala and into the overlying soft tissues. This appears to cause mass effect on the left-sided exiting nerve root at S1-S2. 3. Scattered numerous hypodensities are within the liver, but suspicious for cyst. Of note his workup as outpatient also showed normal PSA is slightly elevated CEA at 14.1. M spike was not detected on electrophoresis.  Radiation oncology, Dr. Margaretmary [name],  is to see the patient, for evaluation of the abnormal lesions, which caused significant degree of pain .  We were requested to see this patient with recommendations.        PMH:  Past Medical History  Diagnosis Date  . Cancer   . Hypertension   . Glaucoma     Surgeries:  Past Surgical History  Procedure Laterality Date  . Cataract extraction, bilateral Bilateral 2009    Allergies:  Allergies  Allergen Reactions  . Hydrocodone Rash    Medications:   Prior to Admission:  Prescriptions prior to admission  Medication Sig Dispense Refill  . Flaxseed, Linseed, (FLAX SEED OIL PO) Take 1 capsule by mouth daily.      Marland Kitchen  ibuprofen (ADVIL,MOTRIN) 200 MG tablet Take 400 mg by mouth every 8 (eight) hours as needed for pain.      . Multiple Vitamin (MULTIVITAMIN WITH MINERALS) TABS Take 1 tablet by mouth daily.      Marland Kitchen oxyCODONE-acetaminophen (PERCOCET) 10-325 MG per tablet Take 0.5-1 tablets by mouth every 6 (six) hours as needed for pain.        ZOX:WRUEAV chloride, acetaminophen, acetaminophen, HYDROcodone-acetaminophen, morphine injection, ondansetron (ZOFRAN) IV, ondansetron, sodium chloride  ROS: Constitutional: Positive for 10 pound weight loss. Negative for fever, chills or  night sweats.  Eyes: Negative for blurred vision and double vision.  Respiratory:  Negative for productive cough. No hemoptysis.No hoarseness.No neck swelling.  Positive for shortness of breath on exertion. No pleuritic chest pain.  Cardiovascular: Negative for chest pain. No palpitations.  GI: Negative for  nausea, vomiting, diarrhea or constipation. No change in bowel caliber. No  Melena or hematochezia. No abdominal pain.  GU: Negative for hematuria. No loss of urinary control. No urinary retention. Skin: Negative for itching. No rash. No petechia. No easy  Bruising. Musculoskeletal: Denies back pain is present when ambulating. Pain with sitting down is present as well. Neurological: No headaches.No confusion. No motor or sensory deficits.  Family History:    Family History  Problem Relation Age of Onset  . Heart failure Mother   . Stroke Father   . Heart disease Brother      Social History:  reports that he has quit smoking. He does not have any smokeless tobacco history on file. He reports that he does not drink alcohol or use illicit drugs.  Physical Exam    Filed Vitals:   02/10/13 0536  BP: 131/73  Pulse: 79  Temp: 98.2 F (36.8 C)  Resp: 14     Filed Weights   02/10/13 0135  Weight: 180 lb (81.647 kg)   General: 82  -year-old white male   in no acute distress A. and O. x3  well-developed, well-nourished.  HEENT: Normocephalic, atraumatic, PERRLA. Sclerae anicteric. Oral cavity without thrush or lesions. NECK:supple. no thyromegaly, no cervical or supraclavicular adenopathy  LUNGS: clear bilaterally,a distant sounds are present . No wheezing, rhonchi or rales. No axillary masses. BREASTS: not examined. CARDIOVASCULAR: regular rate and rhythm, no murmur , rubs or gallops ABDOMEN: soft nontender , bowel sounds x4. No HSM. No masses palpable.  GU/rectal: deferred. EXTREMITIES: no clubbing cyanosis or edema. No bruising or petechial rash MUSCULOSKELETAL: no spinal tenderness is reproducible.  NEURO: remarkable for decreased strength in the left lower  extremity. No Horner's.   Labs:  CBC   Recent Labs Lab 02/09/13 2025 02/10/13 0345  WBC 15.1* 12.7*  HGB 14.4 12.8*  HCT 41.9 37.6*  PLT 220 188  MCV 90.1 90.4  MCH 31.0 30.8  MCHC 34.4 34.0  RDW 12.9 13.0  LYMPHSABS 1.7  --   MONOABS 1.1*  --   EOSABS 0.4  --   BASOSABS 0.0  --      CMP    Recent Labs Lab 02/09/13 2025 02/10/13 0345  NA 136 134*  K 4.5 3.9  CL 98 98  CO2 29 27  GLUCOSE 122* 99  BUN 13 11  CREATININE 0.84 0.79  CALCIUM 9.8 9.3  MG  --  1.9  AST 38* 32  ALT 29 26  ALKPHOS 91 75  BILITOT 0.6 0.5        Component Value Date/Time   BILITOT 0.5 02/10/2013 0345  Recent Labs Lab 02/10/13 0345  INR 1.00    No results found for this basename: DDIMER,  in the last 72 hours   Anemia panel:  No results found for this basename: VITAMINB12, FOLATE, FERRITIN, TIBC, IRON, RETICCTPCT,  in the last 72 hours   Imaging Studies:  Ct Chest W Contrast  02/09/2013  *RADIOLOGY REPORT*  Clinical Data:  Weakness and lower back pain.  Lung mass noted on chest radiograph.  Metastases suspected on lumbar spine MRI.  CT CHEST, ABDOMEN AND PELVIS WITH CONTRAST  Technique:  Multidetector CT imaging of the chest, abdomen and pelvis was performed following the standard protocol during bolus administration of intravenous contrast.  Contrast: 100 mL of Omnipaque 300 IV contrast  Comparison:  MRI of the lumbar spine performed 02/02/2013.  CT CHEST  Findings:  There is a lobulated spiculated mass within the inferior right upper lobe, measuring approximately 3.2 x 2.7 x 2.8 cm, near the right hilum.  This is concerning for primary malignancy. Scattered tiny nodules are noted throughout both lungs, more prominent in the periphery, nonspecific in appearance.  Minimal emphysematous change is noted at the lung apices.  A somewhat larger nonspecific 6 mm nodule is noted within the left lower lobe (image 26 of 61).  No pleural effusion or pneumothorax is seen.  The  mediastinum is grossly unremarkable in appearance.  No mediastinal lymphadenopathy is seen.  No pericardial effusion is identified.  Scattered calcification is noted along the thoracic aorta and proximal great vessels.  The thyroid gland is unremarkable in appearance, aside from minimal hypodensity within the left thyroid lobe.  No dominant mass is seen.  No axillary lymphadenopathy is appreciated.  A large complex soft tissue mass is noted along the right posterolateral chest wall, arising from the right fifth rib, with diffuse bony destruction and expansion.  This measures approximately 7.4 x 3.1 x 5.4 cm, and is concerning for malignancy; metastasis could have this appearance.  There appears to be a lytic lesion within vertebral body T5, with associated mild fragmentation and loss of height at T5.  No definite additional osseous abnormalities are characterized, though evaluation for bony lytic metastatic disease is limited on CT.  IMPRESSION:  1.   Lobulated spiculated mass in the inferior right upper lobe, measuring 3.2 x 2.7 x 2.8 cm, near the right hilum.  This is concerning for primary bronchogenic malignancy. 2.  Scattered tiny nodules throughout both lungs, more prominent in the periphery, nonspecific in appearance.  Mildly larger nonspecific 6 mm nodule in the left lower lobe. 3.  Minimal emphysematous change at the lung apices. 4.  Large complex soft tissue mass along the right posterolateral chest wall, arising from the right fifth rib, with diffuse bony destruction and expansion.  This measures 7.4 x 3.1 x 5.4 cm, and is concerning for malignancy; metastasis could have this appearance. 5.  Lytic lesion within vertebral body T5, with mild fragmentation and loss of height at T5.  No additional osseous abnormalities seen along the thoracic spine, though evaluation for bony metastases is somewhat limited on CT.   Ct Abdomen Pelvis W Contrast  02/09/2013 CT ABDOMEN AND PELVIS  Findings:  Scattered  numerous hypodensities within the liver are nonspecific but have the appearance of cysts; no definite suspicious hepatic lesions are characterized.  These measure up to 3.5 cm in size. The spleen is unremarkable in appearance.  There is suggestion of mild gallbladder wall thickening; the gallbladder is otherwise grossly unremarkable in appearance.  No  definite stones are characterized.  There is a mildly heterogeneous 3.1 cm mass at the right adrenal gland; this is suspicious for metastatic disease.  The left adrenal gland is unremarkable in appearance.  The kidneys are unremarkable in appearance.  There is no evidence of hydronephrosis.  No renal or ureteral stones are seen.  No perinephric stranding is appreciated.  No free fluid is identified.  The small bowel is unremarkable in appearance.  The stomach is within normal limits.  No acute vascular abnormalities are seen.  Relatively diffuse calcification is noted along the abdominal aorta and its branches, including at the origins of both renal arteries.  The appendix is normal in caliber, without evidence for appendicitis.  Mild scattered diverticulosis is noted along the cecum and ascending colon, and along the descending and proximal sigmoid colon.  The colon is otherwise unremarkable in appearance.  The bladder is significantly distended and grossly unremarkable in appearance.  The prostate is enlarged, measuring 5.4 cm in transverse dimension.  Small bilateral inguinal hernias are seen, containing only fat.  A small right-sided hydrocele is incidentally noted.  No inguinal lymphadenopathy is seen.  Lytic lesions within vertebral bodies T12, L1, L2, L4, L5 and the upper sacrum are better characterized on recent MRI.  The largest lesions are within L2 and the sacrum; there is partial fragmentation of S1, with mass extending along the left sacral ala and into the overlying soft tissues. This appears to cause mass effect on the left-sided exiting nerve root at  S1-S2.  IMPRESSION:  1.  3.1 cm mildly heterogeneous mass at the right adrenal gland is suspicious for metastatic disease.  2.  Lytic lesions within vertebral bodies T12, L1, L2, L4, L5 and the upper sacrum are better characterized on recent MRI.  Largest lesions are noted within L2 and the sacrum; partial fragmentation of S1, with mass extending along the left sacral ala and into the overlying soft tissues.  This appears to cause mass effect on the left-sided exiting nerve root at S1-S2. 3.  Scattered numerous hypodensities within the liver are nonspecific but appear to reflect cysts.  If there is significant clinical concern for hepatic metastases, further evaluation could be considered. 4.  Suggestion of mild gallbladder wall thickening; gallbladder otherwise unremarkable in appearance. 5.  Relatively diffuse calcification along the abdominal aorta and its branches, including at the origins of the renal arteries. 6.  Mild scattered diverticulosis along the cecum and ascending colon, and along the descending and proximal sigmoid colon. 7.  Enlarged prostate noted. 8.  Small bilateral inguinal hernias, containing only fat. 9.  Small right-sided hydrocele incidentally seen.   Original Report Authenticated By: Tonia Ghent, M.D.       A/P: 72 y.o. male likely to have advanced lung cancer, with multiple areas of bony involvement including S1-S2, with subsequent left lower extremity weakness. The patient was referred to [facility], for biopsy, and order to obtain tissue diagnosis. He was placed on Decadron, and radiation oncology has seen the patient, with plans to follow. Agree with radiation oncology consultation.was discharged, it would be prudent for the patient to followup with Dr. Ubaldo [name] to continue his oncological care.Dr. Clelia Croft to see patient following this consult.    Marcos Eke, PA-C 02/10/2013 8:37 AM

## 2013-02-10 NOTE — H&P (Signed)
HPI: Gabriel [name] is an 72 y.o. male who is found to have a right mass with multiple skeletal lesions concerning for metastasis. IR is requested to obtain tissue diagnosis by perc bx of chest wall mass. PMHx and meds reviewed. Images reviewed with IR MD who feels rt chest wall lesion is accessible.  Past Medical History:  Past Medical History  Diagnosis Date  . Cancer   . Hypertension   . Glaucoma     Past Surgical History:  Past Surgical History  Procedure Laterality Date  . Cataract extraction, bilateral Bilateral 2009    Family History:  Family History  Problem Relation Age of Onset  . Heart failure Mother   . Stroke Father   . Heart disease Brother     Social History:  reports that he has quit smoking. He does not have any smokeless tobacco history on file. He reports that he does not drink alcohol or use illicit drugs.  Allergies:  Allergies  Allergen Reactions  . Hydrocodone Rash    Medications: Medications Prior to Admission  Medication Sig Dispense Refill  . Flaxseed, Linseed, (FLAX SEED OIL PO) Take 1 capsule by mouth daily.      Marland Kitchen ibuprofen (ADVIL,MOTRIN) 200 MG tablet Take 400 mg by mouth every 8 (eight) hours as needed for pain.      . Multiple Vitamin (MULTIVITAMIN WITH MINERALS) TABS Take 1 tablet by mouth daily.      Marland Kitchen oxyCODONE-acetaminophen (PERCOCET) 10-325 MG per tablet Take 0.5-1 tablets by mouth every 6 (six) hours as needed for pain.        Please HPI for pertinent positives, otherwise complete 10 system ROS negative.  Physical Exam: Blood pressure 131/73, pulse 79, temperature 98.2 F (36.8 C), temperature source Oral, resp. rate 14, height 5\' 11"  (1.803 m), weight 180 lb (81.647 kg), SpO2 98.00%. Body mass index is 25.12 kg/(m^2).   General Appearance:  Alert, cooperative, no distress, appears stated age  Head:  Normocephalic, without obvious abnormality, atraumatic  ENT: Unremarkable  Neck: Supple, symmetrical, trachea midline,    Lungs:   Clear to auscultation bilaterally, no w/r/r, respirations unlabored without use of accessory muscles.  Chest Wall:  No tenderness or deformity  Heart:  Regular rate and rhythm, S1, S2 normal, no murmur, rub or gallop.   Neurologic: Normal affect, no gross deficits.   Results for orders placed during the hospital encounter of 02/09/13 (from the past 48 hour(s))  CBC WITH DIFFERENTIAL     Status: Abnormal   Collection Time    02/09/13  8:25 PM      Result Value Range   WBC 15.1 (*) 4.0 - 10.5 K/uL   RBC 4.65  4.22 - 5.81 MIL/uL   Hemoglobin 14.4  13.0 - 17.0 g/dL   HCT 16.1  09.6 - 04.5 %   MCV 90.1  78.0 - 100.0 fL   MCH 31.0  26.0 - 34.0 pg   MCHC 34.4  30.0 - 36.0 g/dL   RDW 40.9  81.1 - 91.4 %   Platelets 220  150 - 400 K/uL   Neutrophils Relative 79 (*) 43 - 77 %   Neutro Abs 11.9 (*) 1.7 - 7.7 K/uL   Lymphocytes Relative 11 (*) 12 - 46 %   Lymphs Abs 1.7  0.7 - 4.0 K/uL   Monocytes Relative 8  3 - 12 %   Monocytes Absolute 1.1 (*) 0.1 - 1.0 K/uL   Eosinophils Relative 2  0 - 5 %  Eosinophils Absolute 0.4  0.0 - 0.7 K/uL   Basophils Relative 0  0 - 1 %   Basophils Absolute 0.0  0.0 - 0.1 K/uL  COMPREHENSIVE METABOLIC PANEL     Status: Abnormal   Collection Time    02/09/13  8:25 PM      Result Value Range   Sodium 136  135 - 145 mEq/L   Potassium 4.5  3.5 - 5.1 mEq/L   Chloride 98  96 - 112 mEq/L   CO2 29  19 - 32 mEq/L   Glucose, Bld 122 (*) 70 - 99 mg/dL   BUN 13  6 - 23 mg/dL   Creatinine, Ser 5.64  0.50 - 1.35 mg/dL   Calcium 9.8  8.4 - 33.2 mg/dL   Total Protein 7.2  6.0 - 8.3 g/dL   Albumin 3.9  3.5 - 5.2 g/dL   AST 38 (*) 0 - 37 U/L   ALT 29  0 - 53 U/L   Alkaline Phosphatase 91  39 - 117 U/L   Total Bilirubin 0.6  0.3 - 1.2 mg/dL   GFR calc non Af Amer 87 (*) >90 mL/min   GFR calc Af Amer >90  >90 mL/min   Comment:            The eGFR has been calculated     using the CKD EPI equation.     This calculation has not been     validated in all  clinical     situations.     eGFR's persistently     <90 mL/min signify     possible Chronic Kidney Disease.  MAGNESIUM     Status: None   Collection Time    02/10/13  3:45 AM      Result Value Range   Magnesium 1.9  1.5 - 2.5 mg/dL  PHOSPHORUS     Status: None   Collection Time    02/10/13  3:45 AM      Result Value Range   Phosphorus 3.4  2.3 - 4.6 mg/dL  COMPREHENSIVE METABOLIC PANEL     Status: Abnormal   Collection Time    02/10/13  3:45 AM      Result Value Range   Sodium 134 (*) 135 - 145 mEq/L   Potassium 3.9  3.5 - 5.1 mEq/L   Chloride 98  96 - 112 mEq/L   CO2 27  19 - 32 mEq/L   Glucose, Bld 99  70 - 99 mg/dL   BUN 11  6 - 23 mg/dL   Creatinine, Ser 9.51  0.50 - 1.35 mg/dL   Calcium 9.3  8.4 - 88.4 mg/dL   Total Protein 6.1  6.0 - 8.3 g/dL   Albumin 3.4 (*) 3.5 - 5.2 g/dL   AST 32  0 - 37 U/L   ALT 26  0 - 53 U/L   Alkaline Phosphatase 75  39 - 117 U/L   Total Bilirubin 0.5  0.3 - 1.2 mg/dL   GFR calc non Af Amer 90 (*) >90 mL/min   GFR calc Af Amer >90  >90 mL/min   Comment:            The eGFR has been calculated     using the CKD EPI equation.     This calculation has not been     validated in all clinical     situations.     eGFR's persistently     <90 mL/min signify  possible Chronic Kidney Disease.  CBC     Status: Abnormal   Collection Time    02/10/13  3:45 AM      Result Value Range   WBC 12.7 (*) 4.0 - 10.5 K/uL   RBC 4.16 (*) 4.22 - 5.81 MIL/uL   Hemoglobin 12.8 (*) 13.0 - 17.0 g/dL   HCT 16.1 (*) 09.6 - 04.5 %   MCV 90.4  78.0 - 100.0 fL   MCH 30.8  26.0 - 34.0 pg   MCHC 34.0  30.0 - 36.0 g/dL   RDW 40.9  81.1 - 91.4 %   Platelets 188  150 - 400 K/uL  PROTIME-INR     Status: None   Collection Time    02/10/13  3:45 AM      Result Value Range   Prothrombin Time 13.1  11.6 - 15.2 seconds   INR 1.00  0.00 - 1.49   Ct Chest W Contrast  02/09/2013  *RADIOLOGY REPORT*  Clinical Data:  Weakness and lower back pain.  Lung mass noted on  chest radiograph.  Metastases suspected on lumbar spine MRI.  CT CHEST, ABDOMEN AND PELVIS WITH CONTRAST  Technique:  Multidetector CT imaging of the chest, abdomen and pelvis was performed following the standard protocol during bolus administration of intravenous contrast.  Contrast: 100 mL of Omnipaque 300 IV contrast  Comparison:  MRI of the lumbar spine performed 02/02/2013.  CT CHEST  Findings:  There is a lobulated spiculated mass within the inferior right upper lobe, measuring approximately 3.2 x 2.7 x 2.8 cm, near the right hilum.  This is concerning for primary malignancy. Scattered tiny nodules are noted throughout both lungs, more prominent in the periphery, nonspecific in appearance.  Minimal emphysematous change is noted at the lung apices.  A somewhat larger nonspecific 6 mm nodule is noted within the left lower lobe (image 26 of 61).  No pleural effusion or pneumothorax is seen.  The mediastinum is grossly unremarkable in appearance.  No mediastinal lymphadenopathy is seen.  No pericardial effusion is identified.  Scattered calcification is noted along the thoracic aorta and proximal great vessels.  The thyroid gland is unremarkable in appearance, aside from minimal hypodensity within the left thyroid lobe.  No dominant mass is seen.  No axillary lymphadenopathy is appreciated.  A large complex soft tissue mass is noted along the right posterolateral chest wall, arising from the right fifth rib, with diffuse bony destruction and expansion.  This measures approximately 7.4 x 3.1 x 5.4 cm, and is concerning for malignancy; metastasis could have this appearance.  There appears to be a lytic lesion within vertebral body T5, with associated mild fragmentation and loss of height at T5.  No definite additional osseous abnormalities are characterized, though evaluation for bony lytic metastatic disease is limited on CT.  IMPRESSION:  1.   Lobulated spiculated mass in the inferior right upper lobe, measuring  3.2 x 2.7 x 2.8 cm, near the right hilum.  This is concerning for primary bronchogenic malignancy. 2.  Scattered tiny nodules throughout both lungs, more prominent in the periphery, nonspecific in appearance.  Mildly larger nonspecific 6 mm nodule in the left lower lobe. 3.  Minimal emphysematous change at the lung apices. 4.  Large complex soft tissue mass along the right posterolateral chest wall, arising from the right fifth rib, with diffuse bony destruction and expansion.  This measures 7.4 x 3.1 x 5.4 cm, and is concerning for malignancy; metastasis could have this appearance. 5.  Lytic lesion within vertebral body T5, with mild fragmentation and loss of height at T5.  No additional osseous abnormalities seen along the thoracic spine, though evaluation for bony metastases is somewhat limited on CT.  CT ABDOMEN AND PELVIS  Findings:  Scattered numerous hypodensities within the liver are nonspecific but have the appearance of cysts; no definite suspicious hepatic lesions are characterized.  These measure up to 3.5 cm in size. The spleen is unremarkable in appearance.  There is suggestion of mild gallbladder wall thickening; the gallbladder is otherwise grossly unremarkable in appearance.  No definite stones are characterized.  There is a mildly heterogeneous 3.1 cm mass at the right adrenal gland; this is suspicious for metastatic disease.  The left adrenal gland is unremarkable in appearance.  The kidneys are unremarkable in appearance.  There is no evidence of hydronephrosis.  No renal or ureteral stones are seen.  No perinephric stranding is appreciated.  No free fluid is identified.  The small bowel is unremarkable in appearance.  The stomach is within normal limits.  No acute vascular abnormalities are seen.  Relatively diffuse calcification is noted along the abdominal aorta and its branches, including at the origins of both renal arteries.  The appendix is normal in caliber, without evidence for  appendicitis.  Mild scattered diverticulosis is noted along the cecum and ascending colon, and along the descending and proximal sigmoid colon.  The colon is otherwise unremarkable in appearance.  The bladder is significantly distended and grossly unremarkable in appearance.  The prostate is enlarged, measuring 5.4 cm in transverse dimension.  Small bilateral inguinal hernias are seen, containing only fat.  A small right-sided hydrocele is incidentally noted.  No inguinal lymphadenopathy is seen.  Lytic lesions within vertebral bodies T12, L1, L2, L4, L5 and the upper sacrum are better characterized on recent MRI.  The largest lesions are within L2 and the sacrum; there is partial fragmentation of S1, with mass extending along the left sacral ala and into the overlying soft tissues. This appears to cause mass effect on the left-sided exiting nerve root at S1-S2.  IMPRESSION:  1.  3.1 cm mildly heterogeneous mass at the right adrenal gland is suspicious for metastatic disease.  2.  Lytic lesions within vertebral bodies T12, L1, L2, L4, L5 and the upper sacrum are better characterized on recent MRI.  Largest lesions are noted within L2 and the sacrum; partial fragmentation of S1, with mass extending along the left sacral ala and into the overlying soft tissues.  This appears to cause mass effect on the left-sided exiting nerve root at S1-S2. 3.  Scattered numerous hypodensities within the liver are nonspecific but appear to reflect cysts.  If there is significant clinical concern for hepatic metastases, further evaluation could be considered. 4.  Suggestion of mild gallbladder wall thickening; gallbladder otherwise unremarkable in appearance. 5.  Relatively diffuse calcification along the abdominal aorta and its branches, including at the origins of the renal arteries. 6.  Mild scattered diverticulosis along the cecum and ascending colon, and along the descending and proximal sigmoid colon. 7.  Enlarged prostate  noted. 8.  Small bilateral inguinal hernias, containing only fat. 9.  Small right-sided hydrocele incidentally seen.   Original Report Authenticated By: Tonia Ghent, M.D.    Ct Abdomen Pelvis W Contrast  02/09/2013  *RADIOLOGY REPORT*  Clinical Data:  Weakness and lower back pain.  Lung mass noted on chest radiograph.  Metastases suspected on lumbar spine MRI.  CT CHEST, ABDOMEN AND PELVIS  WITH CONTRAST  Technique:  Multidetector CT imaging of the chest, abdomen and pelvis was performed following the standard protocol during bolus administration of intravenous contrast.  Contrast: 100 mL of Omnipaque 300 IV contrast  Comparison:  MRI of the lumbar spine performed 02/02/2013.  CT CHEST  Findings:  There is a lobulated spiculated mass within the inferior right upper lobe, measuring approximately 3.2 x 2.7 x 2.8 cm, near the right hilum.  This is concerning for primary malignancy. Scattered tiny nodules are noted throughout both lungs, more prominent in the periphery, nonspecific in appearance.  Minimal emphysematous change is noted at the lung apices.  A somewhat larger nonspecific 6 mm nodule is noted within the left lower lobe (image 26 of 61).  No pleural effusion or pneumothorax is seen.  The mediastinum is grossly unremarkable in appearance.  No mediastinal lymphadenopathy is seen.  No pericardial effusion is identified.  Scattered calcification is noted along the thoracic aorta and proximal great vessels.  The thyroid gland is unremarkable in appearance, aside from minimal hypodensity within the left thyroid lobe.  No dominant mass is seen.  No axillary lymphadenopathy is appreciated.  A large complex soft tissue mass is noted along the right posterolateral chest wall, arising from the right fifth rib, with diffuse bony destruction and expansion.  This measures approximately 7.4 x 3.1 x 5.4 cm, and is concerning for malignancy; metastasis could have this appearance.  There appears to be a lytic lesion  within vertebral body T5, with associated mild fragmentation and loss of height at T5.  No definite additional osseous abnormalities are characterized, though evaluation for bony lytic metastatic disease is limited on CT.  IMPRESSION:  1.   Lobulated spiculated mass in the inferior right upper lobe, measuring 3.2 x 2.7 x 2.8 cm, near the right hilum.  This is concerning for primary bronchogenic malignancy. 2.  Scattered tiny nodules throughout both lungs, more prominent in the periphery, nonspecific in appearance.  Mildly larger nonspecific 6 mm nodule in the left lower lobe. 3.  Minimal emphysematous change at the lung apices. 4.  Large complex soft tissue mass along the right posterolateral chest wall, arising from the right fifth rib, with diffuse bony destruction and expansion.  This measures 7.4 x 3.1 x 5.4 cm, and is concerning for malignancy; metastasis could have this appearance. 5.  Lytic lesion within vertebral body T5, with mild fragmentation and loss of height at T5.  No additional osseous abnormalities seen along the thoracic spine, though evaluation for bony metastases is somewhat limited on CT.  CT ABDOMEN AND PELVIS  Findings:  Scattered numerous hypodensities within the liver are nonspecific but have the appearance of cysts; no definite suspicious hepatic lesions are characterized.  These measure up to 3.5 cm in size. The spleen is unremarkable in appearance.  There is suggestion of mild gallbladder wall thickening; the gallbladder is otherwise grossly unremarkable in appearance.  No definite stones are characterized.  There is a mildly heterogeneous 3.1 cm mass at the right adrenal gland; this is suspicious for metastatic disease.  The left adrenal gland is unremarkable in appearance.  The kidneys are unremarkable in appearance.  There is no evidence of hydronephrosis.  No renal or ureteral stones are seen.  No perinephric stranding is appreciated.  No free fluid is identified.  The small bowel is  unremarkable in appearance.  The stomach is within normal limits.  No acute vascular abnormalities are seen.  Relatively diffuse calcification is noted along the abdominal  aorta and its branches, including at the origins of both renal arteries.  The appendix is normal in caliber, without evidence for appendicitis.  Mild scattered diverticulosis is noted along the cecum and ascending colon, and along the descending and proximal sigmoid colon.  The colon is otherwise unremarkable in appearance.  The bladder is significantly distended and grossly unremarkable in appearance.  The prostate is enlarged, measuring 5.4 cm in transverse dimension.  Small bilateral inguinal hernias are seen, containing only fat.  A small right-sided hydrocele is incidentally noted.  No inguinal lymphadenopathy is seen.  Lytic lesions within vertebral bodies T12, L1, L2, L4, L5 and the upper sacrum are better characterized on recent MRI.  The largest lesions are within L2 and the sacrum; there is partial fragmentation of S1, with mass extending along the left sacral ala and into the overlying soft tissues. This appears to cause mass effect on the left-sided exiting nerve root at S1-S2.  IMPRESSION:  1.  3.1 cm mildly heterogeneous mass at the right adrenal gland is suspicious for metastatic disease.  2.  Lytic lesions within vertebral bodies T12, L1, L2, L4, L5 and the upper sacrum are better characterized on recent MRI.  Largest lesions are noted within L2 and the sacrum; partial fragmentation of S1, with mass extending along the left sacral ala and into the overlying soft tissues.  This appears to cause mass effect on the left-sided exiting nerve root at S1-S2. 3.  Scattered numerous hypodensities within the liver are nonspecific but appear to reflect cysts.  If there is significant clinical concern for hepatic metastases, further evaluation could be considered. 4.  Suggestion of mild gallbladder wall thickening; gallbladder otherwise  unremarkable in appearance. 5.  Relatively diffuse calcification along the abdominal aorta and its branches, including at the origins of the renal arteries. 6.  Mild scattered diverticulosis along the cecum and ascending colon, and along the descending and proximal sigmoid colon. 7.  Enlarged prostate noted. 8.  Small bilateral inguinal hernias, containing only fat. 9.  Small right-sided hydrocele incidentally seen.   Original Report Authenticated By: Tonia Ghent, M.D.     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 Discussed CT guided biopsy of posterior (R)chest wall mass. Explained procedure, risks, complications, use of sedation. Labs reviewed. Consent signed in chart  Brayton El PA-C 02/10/2013, 10:52 AM

## 2013-02-10 NOTE — H&P (Signed)
Agree with PA note.  Will attempt CT guided biopsy.  Signed,  Sterling Big, MD Vascular & Interventional Radiologist [facility] Rad[facility]

## 2013-02-11 NOTE — Progress Notes (Signed)
[facility] PROGRESS NOTE  Gabriel [name] NFA:[id number] DOB: May 28, 1941 DOA: 02/09/2013 PCP: No primary provider on file.  Brief narrative: 72 year old male who presented with left lower extremity numbness and back pain. 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 In addition there is a partial fragmentation of S1, with mass extending along the left sacral ala which appears to cause mass effect on the left-sided exiting nerve root at S1-S2. Patient has soft tissue mass in right 5th rib biopsied today and we are awaiting for the final results. Oncology and radiation oncology are following.   Assessment/Plan:   Principal Problem:  *Left lower extremity numbness, S1 - S2 nerve cmpression - this appears to be from mass identified on CT scan which extends along the left sacral ala causing mass effect  - radiation oncology consulted - once biopsy available then radiation to start in [place], [place] - continue decadron 6 mg Q 6 hours  - pain management for back pain with morphine 2-4 mg Q 4 hours IV PRN severe pain and norco for moderate pain PRN  Active Problems:  Hypertension  - continue Norvasc and Losartan  Lung mass  - follow up biopsy results of soft tissue mass  - appreciate oncology following  Code Status: full code  Family Communication: no family at bedside  Disposition Plan: home when stable   Manson Passey, MD  [facility]  Pager [phone]   Consultants:  Oncology (Dr. Clelia Croft)  Radiation oncology (Dr. Kathrynn Running)  PCCM Procedures:  CT guided biopsy of soft tissue mass arising from 5th right rib 02/10/13 Antibiotics:  None     If 7PM-7AM, please contact night-coverage [email or web address] Password [facility] 02/11/2013, 6:40 AM   LOS: 2 days     HPI/Subjective: No acute overnight events.  Objective: Filed Vitals:   02/10/13 1605 02/10/13 1609 02/10/13 2128 02/11/13 0530  BP: 106/59 114/64 136/75 125/62  Pulse: 93 94 92 70  Temp:   98.5 F (36.9 C) 97.6 F (36.4 C)  TempSrc:   Oral Oral  Resp: 12 18 18 18   Height:      Weight:      SpO2: 99% 99% 98% 100%    Intake/Output Summary (Last 24 hours) at 02/11/13 0640 Last data filed at 02/10/13 1906  Gross per 24 hour  Intake    240 ml  Output    400 ml  Net   -160 ml    Exam:   General:  Pt is alert, follows commands appropriately, not in acute distress  Cardiovascular: Regular rate and rhythm, S1/S2, no murmurs, no rubs, no gallops  Respiratory: Clear to auscultation bilaterally, no wheezing, no crackles, no rhonchi  Abdomen: Soft, non tender, non distended, bowel sounds present, no guarding  Extremities: No edema, pulses DP and PT palpable bilaterally  Neuro: Grossly nonfocal  Data Reviewed: Basic Metabolic Panel:  Recent Labs Lab 02/09/13 2025 02/10/13 0345  NA 136 134*  K 4.5 3.9  CL 98 98  CO2 29 27  GLUCOSE 122* 99  BUN 13 11  CREATININE 0.84 0.79  CALCIUM 9.8 9.3  MG  --  1.9  PHOS  --  3.4   Liver Function Tests:  Recent Labs Lab 02/09/13 2025 02/10/13 0345  AST 38*  32  ALT 29 26  ALKPHOS 91 75  BILITOT 0.6 0.5  PROT 7.2 6.1  ALBUMIN 3.9 3.4*   CBC:  Recent Labs Lab 02/09/13 2025 02/10/13 0345  WBC 15.1* 12.7*  NEUTROABS 11.9*  --   HGB 14.4 12.8*  HCT 41.9 37.6*  MCV 90.1 90.4  PLT 220 188    Studies: Ct Chest W Contrast  02/09/2013  *RADIOLOGY REPORT*  Clinical Data:  Weakness and lower back pain.  Lung mass noted on chest radiograph.  Metastases suspected on lumbar spine MRI.  CT CHEST, ABDOMEN AND PELVIS WITH CONTRAST  Technique:  Multidetector CT imaging of the chest, abdomen and pelvis was performed following the standard protocol during bolus administration of intravenous contrast.  Contrast: 100 mL of  Omnipaque 300 IV contrast  Comparison:  MRI of the lumbar spine performed 02/02/2013.  CT CHEST  Findings:  There is a lobulated spiculated mass within the inferior right upper lobe, measuring approximately 3.2 x 2.7 x 2.8 cm, near the right hilum.  This is concerning for primary malignancy. Scattered tiny nodules are noted throughout both lungs, more prominent in the periphery, nonspecific in appearance.  Minimal emphysematous change is noted at the lung apices.  A somewhat larger nonspecific 6 mm nodule is noted within the left lower lobe (image 26 of 61).  No pleural effusion or pneumothorax is seen.  The mediastinum is grossly unremarkable in appearance.  No mediastinal lymphadenopathy is seen.  No pericardial effusion is identified.  Scattered calcification is noted along the thoracic aorta and proximal great vessels.  The thyroid gland is unremarkable in appearance, aside from minimal hypodensity within the left thyroid lobe.  No dominant mass is seen.  No axillary lymphadenopathy is appreciated.  A large complex soft tissue mass is noted along the right posterolateral chest wall, arising from the right fifth rib, with diffuse bony destruction and expansion.  This measures approximately 7.4 x 3.1 x 5.4 cm, and is concerning for malignancy; metastasis could have this appearance.  There appears to be a lytic lesion within vertebral body T5, with associated mild fragmentation and loss of height at T5.  No definite additional osseous abnormalities are characterized, though evaluation for bony lytic metastatic disease is limited on CT.  IMPRESSION:  1.   Lobulated spiculated mass in the inferior right upper lobe, measuring 3.2 x 2.7 x 2.8 cm, near the right hilum.  This is concerning for primary bronchogenic malignancy. 2.  Scattered tiny nodules throughout both lungs, more prominent in the periphery, nonspecific in appearance.  Mildly larger nonspecific 6 mm nodule in the left lower lobe. 3.  Minimal emphysematous  change at the lung apices. 4.  Large complex soft tissue mass along the right posterolateral chest wall, arising from the right fifth rib, with diffuse bony destruction and expansion.  This measures 7.4 x 3.1 x 5.4 cm, and is concerning for malignancy; metastasis could have this appearance. 5.  Lytic lesion within vertebral body T5, with mild fragmentation and loss of height at T5.  No additional osseous abnormalities seen along the thoracic spine, though evaluation for bony metastases is somewhat limited on CT.  CT ABDOMEN AND PELVIS  Findings:  Scattered numerous hypodensities within the liver are nonspecific but have the appearance of cysts; no definite suspicious hepatic lesions are characterized.  These measure up to 3.5 cm in size. The spleen is unremarkable in appearance.  There is suggestion of mild gallbladder wall thickening; the gallbladder is otherwise grossly unremarkable in appearance.  No definite stones are characterized.  There is a mildly heterogeneous 3.1 cm mass at the right adrenal gland; this is suspicious for metastatic disease.  The left adrenal gland is unremarkable in appearance.  The kidneys are unremarkable in appearance.  There is no evidence of hydronephrosis.  No renal or ureteral stones are seen.  No perinephric stranding is appreciated.  No free fluid is identified.  The small bowel is unremarkable in appearance.  The stomach is within normal limits.  No acute vascular abnormalities are seen.  Relatively diffuse calcification is noted along the abdominal aorta and its branches, including at the origins of both renal arteries.  The appendix is normal in caliber, without evidence for appendicitis.  Mild scattered diverticulosis is noted along the cecum and ascending colon, and along the descending and proximal sigmoid colon.  The colon is otherwise unremarkable in appearance.  The bladder is significantly distended and grossly unremarkable in appearance.  The prostate is enlarged,  measuring 5.4 cm in transverse dimension.  Small bilateral inguinal hernias are seen, containing only fat.  A small right-sided hydrocele is incidentally noted.  No inguinal lymphadenopathy is seen.  Lytic lesions within vertebral bodies T12, L1, L2, L4, L5 and the upper sacrum are better characterized on recent MRI.  The largest lesions are within L2 and the sacrum; there is partial fragmentation of S1, with mass extending along the left sacral ala and into the overlying soft tissues. This appears to cause mass effect on the left-sided exiting nerve root at S1-S2.  IMPRESSION:  1.  3.1 cm mildly heterogeneous mass at the right adrenal gland is suspicious for metastatic disease.  2.  Lytic lesions within vertebral bodies T12, L1, L2, L4, L5 and the upper sacrum are better characterized on recent MRI.  Largest lesions are noted within L2 and the sacrum; partial fragmentation of S1, with mass extending along the left sacral ala and into the overlying soft tissues.  This appears to cause mass effect on the left-sided exiting nerve root at S1-S2. 3.  Scattered numerous hypodensities within the liver are nonspecific but appear to reflect cysts.  If there is significant clinical concern for hepatic metastases, further evaluation could be considered. 4.  Suggestion of mild gallbladder wall thickening; gallbladder otherwise unremarkable in appearance. 5.  Relatively diffuse calcification along the abdominal aorta and its branches, including at the origins of the renal arteries. 6.  Mild scattered diverticulosis along the cecum and ascending colon, and along the descending and proximal sigmoid colon. 7.  Enlarged prostate noted. 8.  Small bilateral inguinal hernias, containing only fat. 9.  Small right-sided hydrocele incidentally seen.   Original Report Authenticated By: Tonia Ghent, M.D.    Ct Abdomen Pelvis W Contrast  02/09/2013  *RADIOLOGY REPORT*  Clinical Data:  Weakness and lower back pain.  Lung mass noted on  chest radiograph.  Metastases suspected on lumbar spine MRI.  CT CHEST, ABDOMEN AND PELVIS WITH CONTRAST  Technique:  Multidetector CT imaging of the chest, abdomen and pelvis was performed following the standard protocol during bolus administration of intravenous contrast.  Contrast: 100 mL of Omnipaque 300 IV contrast  Comparison:  MRI of the lumbar spine performed 02/02/2013.  CT CHEST  Findings:  There is a lobulated spiculated mass within the inferior right upper lobe, measuring approximately 3.2 x 2.7 x 2.8 cm, near the right hilum.  This is concerning for primary malignancy. Scattered tiny nodules are noted throughout both lungs, more prominent in the periphery, nonspecific in  appearance.  Minimal emphysematous change is noted at the lung apices.  A somewhat larger nonspecific 6 mm nodule is noted within the left lower lobe (image 26 of 61).  No pleural effusion or pneumothorax is seen.  The mediastinum is grossly unremarkable in appearance.  No mediastinal lymphadenopathy is seen.  No pericardial effusion is identified.  Scattered calcification is noted along the thoracic aorta and proximal great vessels.  The thyroid gland is unremarkable in appearance, aside from minimal hypodensity within the left thyroid lobe.  No dominant mass is seen.  No axillary lymphadenopathy is appreciated.  A large complex soft tissue mass is noted along the right posterolateral chest wall, arising from the right fifth rib, with diffuse bony destruction and expansion.  This measures approximately 7.4 x 3.1 x 5.4 cm, and is concerning for malignancy; metastasis could have this appearance.  There appears to be a lytic lesion within vertebral body T5, with associated mild fragmentation and loss of height at T5.  No definite additional osseous abnormalities are characterized, though evaluation for bony lytic metastatic disease is limited on CT.  IMPRESSION:  1.   Lobulated spiculated mass in the inferior right upper lobe, measuring  3.2 x 2.7 x 2.8 cm, near the right hilum.  This is concerning for primary bronchogenic malignancy. 2.  Scattered tiny nodules throughout both lungs, more prominent in the periphery, nonspecific in appearance.  Mildly larger nonspecific 6 mm nodule in the left lower lobe. 3.  Minimal emphysematous change at the lung apices. 4.  Large complex soft tissue mass along the right posterolateral chest wall, arising from the right fifth rib, with diffuse bony destruction and expansion.  This measures 7.4 x 3.1 x 5.4 cm, and is concerning for malignancy; metastasis could have this appearance. 5.  Lytic lesion within vertebral body T5, with mild fragmentation and loss of height at T5.  No additional osseous abnormalities seen along the thoracic spine, though evaluation for bony metastases is somewhat limited on CT.  CT ABDOMEN AND PELVIS  Findings:  Scattered numerous hypodensities within the liver are nonspecific but have the appearance of cysts; no definite suspicious hepatic lesions are characterized.  These measure up to 3.5 cm in size. The spleen is unremarkable in appearance.  There is suggestion of mild gallbladder wall thickening; the gallbladder is otherwise grossly unremarkable in appearance.  No definite stones are characterized.  There is a mildly heterogeneous 3.1 cm mass at the right adrenal gland; this is suspicious for metastatic disease.  The left adrenal gland is unremarkable in appearance.  The kidneys are unremarkable in appearance.  There is no evidence of hydronephrosis.  No renal or ureteral stones are seen.  No perinephric stranding is appreciated.  No free fluid is identified.  The small bowel is unremarkable in appearance.  The stomach is within normal limits.  No acute vascular abnormalities are seen.  Relatively diffuse calcification is noted along the abdominal aorta and its branches, including at the origins of both renal arteries.  The appendix is normal in caliber, without evidence for  appendicitis.  Mild scattered diverticulosis is noted along the cecum and ascending colon, and along the descending and proximal sigmoid colon.  The colon is otherwise unremarkable in appearance.  The bladder is significantly distended and grossly unremarkable in appearance.  The prostate is enlarged, measuring 5.4 cm in transverse dimension.  Small bilateral inguinal hernias are seen, containing only fat.  A small right-sided hydrocele is incidentally noted.  No inguinal lymphadenopathy is seen.  Lytic lesions within vertebral bodies T12, L1, L2, L4, L5 and the upper sacrum are better characterized on recent MRI.  The largest lesions are within L2 and the sacrum; there is partial fragmentation of S1, with mass extending along the left sacral ala and into the overlying soft tissues. This appears to cause mass effect on the left-sided exiting nerve root at S1-S2.  IMPRESSION:  1.  3.1 cm mildly heterogeneous mass at the right adrenal gland is suspicious for metastatic disease.  2.  Lytic lesions within vertebral bodies T12, L1, L2, L4, L5 and the upper sacrum are better characterized on recent MRI.  Largest lesions are noted within L2 and the sacrum; partial fragmentation of S1, with mass extending along the left sacral ala and into the overlying soft tissues.  This appears to cause mass effect on the left-sided exiting nerve root at S1-S2. 3.  Scattered numerous hypodensities within the liver are nonspecific but appear to reflect cysts.  If there is significant clinical concern for hepatic metastases, further evaluation could be considered. 4.  Suggestion of mild gallbladder wall thickening; gallbladder otherwise unremarkable in appearance. 5.  Relatively diffuse calcification along the abdominal aorta and its branches, including at the origins of the renal arteries. 6.  Mild scattered diverticulosis along the cecum and ascending colon, and along the descending and proximal sigmoid colon. 7.  Enlarged prostate  noted. 8.  Small bilateral inguinal hernias, containing only fat. 9.  Small right-sided hydrocele incidentally seen.   Original Report Authenticated By: Tonia Ghent, M.D.    Ct Biopsy 02/10/2013  * IMPRESSION:  Technically successful CT-guided core biopsy of expansile soft tissue mass arising from the right fifth rib.  Signed,  Sterling Big, MD Vascular & Interventional Radiologist [facility]   Original Report Authenticated By: Malachy Moan, M.D.     Scheduled Meds: . amLODipine  10 mg Oral Daily  . dexamethasone  6 mg Oral Q6H  . docusate sodium  100 mg Oral BID  . losartan  100 mg Oral Daily  . timolol  1 drop Both Eyes Daily

## 2013-02-11 NOTE — Progress Notes (Signed)
Utilization review completed.  

## 2013-02-12 MED ORDER — HYDROCODONE-ACETAMINOPHEN 5-325 MG PO TABS: 1.0000 | ORAL_TABLET | ORAL | Status: DC | PRN

## 2013-02-12 MED ORDER — ONDANSETRON HCL 4 MG PO TABS: 4.0000 mg | ORAL_TABLET | Freq: Four times a day (QID) | ORAL | Status: DC | PRN

## 2013-02-12 MED ORDER — ADULT MULTIVITAMIN W/MINERALS CH: 1.0000 | ORAL_TABLET | Freq: Every day | ORAL | Status: DC

## 2013-02-12 MED ORDER — MORPHINE SULFATE 30 MG PO TABS: 30.0000 mg | ORAL_TABLET | ORAL | Status: DC | PRN

## 2013-02-12 MED ORDER — AMLODIPINE BESYLATE 10 MG PO TABS: 10.0000 mg | ORAL_TABLET | Freq: Every day | ORAL | Status: DC

## 2013-02-12 MED ORDER — TIMOLOL MALEATE 0.5 % OP SOLN: 1.0000 [drp] | Freq: Every morning | OPHTHALMIC | Status: AC

## 2013-02-12 MED ORDER — DEXAMETHASONE 6 MG PO TABS: 6.0000 mg | ORAL_TABLET | Freq: Four times a day (QID) | ORAL | Status: DC

## 2013-02-12 MED ORDER — DSS 100 MG PO CAPS: 100.0000 mg | ORAL_CAPSULE | Freq: Two times a day (BID) | ORAL | Status: DC | PRN

## 2013-02-12 MED ORDER — LOSARTAN POTASSIUM 100 MG PO TABS: 100.0000 mg | ORAL_TABLET | Freq: Every day | ORAL | Status: DC

## 2013-02-12 NOTE — Progress Notes (Signed)
Patient discharged to home with family via wheelchair, discharge instructions reviewed with family who verbalized understanding. New RX's given to patient.

## 2013-02-12 NOTE — Discharge Summary (Signed)
Physician Discharge Summary  Gabriel [name] ZOX:[id number] DOB: 1941-01-03 DOA: 02/09/2013  PCP: No primary provider on file.  Admit date: 02/09/2013 Discharge date: 02/12/2013  Recommendations for Outpatient Follow-up:  1. 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 This was communicated to the patient and he was instructed to follow up with radiation oncology. I spoke with rad onc prior to patient's discharge and they informed me that they will call the patient to seutp up simulation and possibly even 1st RT Monday 02/14/2013 in [place], [place] (as this is the patient's prreference location) 2. Patient will continue taking current decadron dosage 6 mg Q 6 hours PO and rad onc to taper the steroids as indicated  Discharge Diagnoses:  Active Problems:   Hypertension   Pain of left leg   Lung mass   Soft tissue mass   Malignant tumor of vertebral column  Discharge Condition: medically stable for discharge home today; patient insisted on going home today. If any issues arise before Monday I have given the patient my personal cell phone number to call for questions and/or concerns.  Diet recommendation: as tolerated  History of present illness:  72 year old male who presented with left lower extremity numbness and back pain. 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 In addition there is a partial fragmentation of S1, with mass extending along the left sacral ala which appears to cause mass effect on the left-sided exiting nerve root at S1-S2. Patient has soft tissue mass in right 5th rib biopsied today and we are awaiting for the final results. Oncology and radiation oncology are following.   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 Clelia Croft)  Radiation oncology (Dr. Kathrynn Running)  PCCM Procedures:  CT guided biopsy of soft tissue mass arising from 5th right rib 02/10/13 Antibiotics:  None    Discharge Exam: Filed Vitals:   02/12/13 0615  BP: 136/78  Pulse: 77  Temp: 97.9 F (36.6 C)  Resp: 18   Filed Vitals:   02/11/13 0530 02/11/13 1440 02/11/13 2150 02/12/13 0615  BP: 125/62 133/72 143/67 136/78  Pulse: 70 86 65 77  Temp: 97.6 F (36.4 C) 97.4 F (36.3 C) 97.7 F (36.5 C) 97.9 F (36.6 C)  TempSrc: Oral Oral Oral Oral  Resp: 18 18 18 18   Height:      Weight:  SpO2: 100% 98% 96% 98%    General: Pt is alert, follows commands appropriately, not in acute distress Cardiovascular: Regular rate and rhythm, S1/S2 +, no murmurs, no rubs, no gallops Respiratory: Clear to auscultation bilaterally, no wheezing, no crackles, no rhonchi Abdominal: Soft, non tender, non distended, bowel sounds +, no guarding Extremities: no edema, no cyanosis, pulses palpable bilaterally DP and PT Neuro: Grossly nonfocal  Discharge Instructions  Discharge Orders   Future Orders Complete By  Expires     Call MD for:  difficulty breathing, headache or visual disturbances  As directed     Call MD for:  persistant dizziness or light-headedness  As directed     Call MD for:  persistant nausea and vomiting  As directed     Call MD for:  severe uncontrolled pain  As directed     Diet - low sodium heart healthy  As directed     Increase activity slowly  As directed         Medication List    STOP taking these medications       oxyCODONE-acetaminophen 10-325 MG per tablet  Commonly known as:  PERCOCET      TAKE these medications       amLODipine 10 MG tablet  Commonly known as:  NORVASC  Take 1 tablet (10 mg total) by mouth daily.     dexamethasone 6 MG tablet  Commonly known as:  DECADRON  Take 1 tablet (6 mg total) by mouth every 6 (six) hours.     DSS 100 MG Caps  Take 100 mg by mouth 2 (two) times daily as needed for constipation.     FLAX SEED OIL PO  Take 1 capsule by mouth daily.     HYDROcodone-acetaminophen 5-325 MG per tablet  Commonly known as:  NORCO/VICODIN  Take 1-2 tablets by mouth every 4 (four) hours as needed for pain.     ibuprofen 200 MG tablet  Commonly known as:  ADVIL,MOTRIN  Take 400 mg by mouth every 8 (eight) hours as needed for pain.     losartan 100 MG tablet  Commonly known as:  COZAAR  Take 1 tablet (100 mg total) by mouth daily.     morphine 30 MG tablet  Commonly known as:  MSIR  Take 1 tablet (30 mg total) by mouth every 4 (four) hours as needed for pain.     multivitamin with minerals Tabs  Take 1 tablet by mouth daily.     ondansetron 4 MG tablet  Commonly known as:  ZOFRAN  Take 1 tablet (4 mg total) by mouth every 6 (six) hours as needed for nausea.     timolol 0.5 % ophthalmic solution  Commonly known as:  TIMOPTIC  Place 1 drop into both eyes every morning.           Follow-up Information   Follow up with Oneita Hurt, MD. (Dr. Rickard Rhymes to call yopu monday 02/14/13)    Contact information:   [address] [place] [address] [phone]        The results of significant diagnostics from this hospitalization (including imaging, microbiology, ancillary and laboratory) are listed below for reference.    Significant Diagnostic Studies: Ct Chest W Contrast  02/09/2013  *RADIOLOGY REPORT*  Clinical Data:  Weakness and lower back pain.  Lung mass noted on chest radiograph.  Metastases suspected on lumbar spine MRI.  CT CHEST, ABDOMEN AND PELVIS WITH CONTRAST  Technique:  Multidetector CT  imaging of the chest, abdomen and pelvis was performed following the standard protocol during bolus administration of intravenous contrast.  Contrast: 100 mL of Omnipaque 300 IV contrast  Comparison:  MRI of the lumbar spine performed 02/02/2013.  CT CHEST  Findings:  There is a lobulated spiculated mass within the inferior right upper lobe, measuring approximately 3.2 x 2.7 x 2.8 cm, near the right hilum.  This is concerning for primary malignancy. Scattered tiny nodules are noted throughout both lungs, more prominent in the periphery, nonspecific in appearance.  Minimal emphysematous change is noted at the lung apices.  A somewhat larger nonspecific 6 mm nodule is noted within the left lower lobe (image 26 of 61).  No pleural effusion or pneumothorax is seen.  The mediastinum is grossly unremarkable in appearance.  No mediastinal lymphadenopathy is seen.  No pericardial effusion is identified.  Scattered calcification is noted along the thoracic aorta and proximal great vessels.  The thyroid gland is unremarkable in appearance, aside from minimal hypodensity within the left thyroid lobe.  No dominant mass is seen.  No axillary lymphadenopathy is appreciated.  A large complex soft tissue mass is noted along the right posterolateral chest wall, arising from the right fifth rib, with diffuse bony destruction and expansion.  This measures approximately 7.4 x 3.1 x 5.4 cm, and is concerning for malignancy; metastasis  could have this appearance.  There appears to be a lytic lesion within vertebral body T5, with associated mild fragmentation and loss of height at T5.  No definite additional osseous abnormalities are characterized, though evaluation for bony lytic metastatic disease is limited on CT.  IMPRESSION:  1.   Lobulated spiculated mass in the inferior right upper lobe, measuring 3.2 x 2.7 x 2.8 cm, near the right hilum.  This is concerning for primary bronchogenic malignancy. 2.  Scattered tiny nodules throughout both lungs, more prominent in the periphery, nonspecific in appearance.  Mildly larger nonspecific 6 mm nodule in the left lower lobe. 3.  Minimal emphysematous change at the lung apices. 4.  Large complex soft tissue mass along the right posterolateral chest wall, arising from the right fifth rib, with diffuse bony destruction and expansion.  This measures 7.4 x 3.1 x 5.4 cm, and is concerning for malignancy; metastasis could have this appearance. 5.  Lytic lesion within vertebral body T5, with mild fragmentation and loss of height at T5.  No additional osseous abnormalities seen along the thoracic spine, though evaluation for bony metastases is somewhat limited on CT.  CT ABDOMEN AND PELVIS  Findings:  Scattered numerous hypodensities within the liver are nonspecific but have the appearance of cysts; no definite suspicious hepatic lesions are characterized.  These measure up to 3.5 cm in size. The spleen is unremarkable in appearance.  There is suggestion of mild gallbladder wall thickening; the gallbladder is otherwise grossly unremarkable in appearance.  No definite stones are characterized.  There is a mildly heterogeneous 3.1 cm mass at the right adrenal gland; this is suspicious for metastatic disease.  The left adrenal gland is unremarkable in appearance.  The kidneys are unremarkable in appearance.  There is no evidence of hydronephrosis.  No renal or ureteral stones are seen.  No perinephric stranding is  appreciated.  No free fluid is identified.  The small bowel is unremarkable in appearance.  The stomach is within normal limits.  No acute vascular abnormalities are seen.  Relatively diffuse calcification is noted along the abdominal aorta and its branches, including at the  origins of both renal arteries.  The appendix is normal in caliber, without evidence for appendicitis.  Mild scattered diverticulosis is noted along the cecum and ascending colon, and along the descending and proximal sigmoid colon.  The colon is otherwise unremarkable in appearance.  The bladder is significantly distended and grossly unremarkable in appearance.  The prostate is enlarged, measuring 5.4 cm in transverse dimension.  Small bilateral inguinal hernias are seen, containing only fat.  A small right-sided hydrocele is incidentally noted.  No inguinal lymphadenopathy is seen.  Lytic lesions within vertebral bodies T12, L1, L2, L4, L5 and the upper sacrum are better characterized on recent MRI.  The largest lesions are within L2 and the sacrum; there is partial fragmentation of S1, with mass extending along the left sacral ala and into the overlying soft tissues. This appears to cause mass effect on the left-sided exiting nerve root at S1-S2.  IMPRESSION:  1.  3.1 cm mildly heterogeneous mass at the right adrenal gland is suspicious for metastatic disease.  2.  Lytic lesions within vertebral bodies T12, L1, L2, L4, L5 and the upper sacrum are better characterized on recent MRI.  Largest lesions are noted within L2 and the sacrum; partial fragmentation of S1, with mass extending along the left sacral ala and into the overlying soft tissues.  This appears to cause mass effect on the left-sided exiting nerve root at S1-S2. 3.  Scattered numerous hypodensities within the liver are nonspecific but appear to reflect cysts.  If there is significant clinical concern for hepatic metastases, further evaluation could be considered. 4.  Suggestion of  mild gallbladder wall thickening; gallbladder otherwise unremarkable in appearance. 5.  Relatively diffuse calcification along the abdominal aorta and its branches, including at the origins of the renal arteries. 6.  Mild scattered diverticulosis along the cecum and ascending colon, and along the descending and proximal sigmoid colon. 7.  Enlarged prostate noted. 8.  Small bilateral inguinal hernias, containing only fat. 9.  Small right-sided hydrocele incidentally seen.   Original Report Authenticated By: Tonia Ghent, M.D.    Ct Abdomen Pelvis W Contrast  02/09/2013  *RADIOLOGY REPORT*  Clinical Data:  Weakness and lower back pain.  Lung mass noted on chest radiograph.  Metastases suspected on lumbar spine MRI.  CT CHEST, ABDOMEN AND PELVIS WITH CONTRAST  Technique:  Multidetector CT imaging of the chest, abdomen and pelvis was performed following the standard protocol during bolus administration of intravenous contrast.  Contrast: 100 mL of Omnipaque 300 IV contrast  Comparison:  MRI of the lumbar spine performed 02/02/2013.  CT CHEST  Findings:  There is a lobulated spiculated mass within the inferior right upper lobe, measuring approximately 3.2 x 2.7 x 2.8 cm, near the right hilum.  This is concerning for primary malignancy. Scattered tiny nodules are noted throughout both lungs, more prominent in the periphery, nonspecific in appearance.  Minimal emphysematous change is noted at the lung apices.  A somewhat larger nonspecific 6 mm nodule is noted within the left lower lobe (image 26 of 61).  No pleural effusion or pneumothorax is seen.  The mediastinum is grossly unremarkable in appearance.  No mediastinal lymphadenopathy is seen.  No pericardial effusion is identified.  Scattered calcification is noted along the thoracic aorta and proximal great vessels.  The thyroid gland is unremarkable in appearance, aside from minimal hypodensity within the left thyroid lobe.  No dominant mass is seen.  No axillary  lymphadenopathy is appreciated.  A large complex soft tissue  mass is noted along the right posterolateral chest wall, arising from the right fifth rib, with diffuse bony destruction and expansion.  This measures approximately 7.4 x 3.1 x 5.4 cm, and is concerning for malignancy; metastasis could have this appearance.  There appears to be a lytic lesion within vertebral body T5, with associated mild fragmentation and loss of height at T5.  No definite additional osseous abnormalities are characterized, though evaluation for bony lytic metastatic disease is limited on CT.  IMPRESSION:  1.   Lobulated spiculated mass in the inferior right upper lobe, measuring 3.2 x 2.7 x 2.8 cm, near the right hilum.  This is concerning for primary bronchogenic malignancy. 2.  Scattered tiny nodules throughout both lungs, more prominent in the periphery, nonspecific in appearance.  Mildly larger nonspecific 6 mm nodule in the left lower lobe. 3.  Minimal emphysematous change at the lung apices. 4.  Large complex soft tissue mass along the right posterolateral chest wall, arising from the right fifth rib, with diffuse bony destruction and expansion.  This measures 7.4 x 3.1 x 5.4 cm, and is concerning for malignancy; metastasis could have this appearance. 5.  Lytic lesion within vertebral body T5, with mild fragmentation and loss of height at T5.  No additional osseous abnormalities seen along the thoracic spine, though evaluation for bony metastases is somewhat limited on CT.  CT ABDOMEN AND PELVIS  Findings:  Scattered numerous hypodensities within the liver are nonspecific but have the appearance of cysts; no definite suspicious hepatic lesions are characterized.  These measure up to 3.5 cm in size. The spleen is unremarkable in appearance.  There is suggestion of mild gallbladder wall thickening; the gallbladder is otherwise grossly unremarkable in appearance.  No definite stones are characterized.  There is a mildly heterogeneous  3.1 cm mass at the right adrenal gland; this is suspicious for metastatic disease.  The left adrenal gland is unremarkable in appearance.  The kidneys are unremarkable in appearance.  There is no evidence of hydronephrosis.  No renal or ureteral stones are seen.  No perinephric stranding is appreciated.  No free fluid is identified.  The small bowel is unremarkable in appearance.  The stomach is within normal limits.  No acute vascular abnormalities are seen.  Relatively diffuse calcification is noted along the abdominal aorta and its branches, including at the origins of both renal arteries.  The appendix is normal in caliber, without evidence for appendicitis.  Mild scattered diverticulosis is noted along the cecum and ascending colon, and along the descending and proximal sigmoid colon.  The colon is otherwise unremarkable in appearance.  The bladder is significantly distended and grossly unremarkable in appearance.  The prostate is enlarged, measuring 5.4 cm in transverse dimension.  Small bilateral inguinal hernias are seen, containing only fat.  A small right-sided hydrocele is incidentally noted.  No inguinal lymphadenopathy is seen.  Lytic lesions within vertebral bodies T12, L1, L2, L4, L5 and the upper sacrum are better characterized on recent MRI.  The largest lesions are within L2 and the sacrum; there is partial fragmentation of S1, with mass extending along the left sacral ala and into the overlying soft tissues. This appears to cause mass effect on the left-sided exiting nerve root at S1-S2.  IMPRESSION:  1.  3.1 cm mildly heterogeneous mass at the right adrenal gland is suspicious for metastatic disease.  2.  Lytic lesions within vertebral bodies T12, L1, L2, L4, L5 and the upper sacrum are better characterized on recent  MRI.  Largest lesions are noted within L2 and the sacrum; partial fragmentation of S1, with mass extending along the left sacral ala and into the overlying soft tissues.  This  appears to cause mass effect on the left-sided exiting nerve root at S1-S2. 3.  Scattered numerous hypodensities within the liver are nonspecific but appear to reflect cysts.  If there is significant clinical concern for hepatic metastases, further evaluation could be considered. 4.  Suggestion of mild gallbladder wall thickening; gallbladder otherwise unremarkable in appearance. 5.  Relatively diffuse calcification along the abdominal aorta and its branches, including at the origins of the renal arteries. 6.  Mild scattered diverticulosis along the cecum and ascending colon, and along the descending and proximal sigmoid colon. 7.  Enlarged prostate noted. 8.  Small bilateral inguinal hernias, containing only fat. 9.  Small right-sided hydrocele incidentally seen.   Original Report Authenticated By: Tonia Ghent, M.D.    Ct Biopsy  02/10/2013  *RADIOLOGY REPORT*  CT BIOPSY  Date: 02/10/2013  Clinical History: 72 year old male with recent CT diagnosis of right upper lobe mass, mediastinal adenopathy and expansile soft tissue mass arising from the right fifth rib concerning for metastatic primary bronchogenic carcinoma.  CT guided biopsy is requested to facilitate tissue diagnosis.  Procedures Performed: 1. CT guided core biopsy  Interventional Radiologist:  Sterling Big, MD  Sedation: Moderate (conscious) sedation was used.  Three mg Versed, 150 mcg Fentanyl were administered intravenously.  The patient's vital signs were monitored continuously by radiology nursing throughout the procedure.  Sedation Time: 21 minutes  PROCEDURE/FINDINGS:   Informed consent was obtained from the patient following explanation of the procedure, risks, benefits and alternatives. The patient understands, agrees and consents for the procedure. All questions were addressed. A time out was performed.  Maximal barrier sterile technique utilized including caps, mask, sterile gowns, sterile gloves, large sterile drape, hand hygiene,  and betadine skin prep.  A planning axial CT scan was performed.  The expansile soft tissue lesion arising from the posterolateral right fifth rib was successfully identified.  A suitable skin entry site was selected and marked.  Local anesthesia was achieved by infiltration of 1% lidocaine.  Under direct CT fluoroscopic guidance a 17 gauge trocar needle was advanced through the posterior thoracic wall and positioned within the soft tissue mass.  Multiple 80 gauge core biopsies were then obtained using the Biopince automated biopsy device.  There is modest bleeding through the outer coaxial needle. Therefore the biopsy tract was embolized with a single Gelfoam torpedo as the needle was removed.  Post biopsy axial CT imaging demonstrated no evidence of large hematoma or other complication. The patient tolerated the procedure well.  IMPRESSION:  Technically successful CT-guided core biopsy of expansile soft tissue mass arising from the right fifth rib.  Signed,  Sterling Big, MD Vascular & Interventional Radiologist [facility] Radiology   Original Report Authenticated By: Malachy Moan, M.D.     Microbiology: No results found for this or any previous visit (from the past 240 hour(s)).   Labs: Basic Metabolic Panel:  Recent Labs Lab 02/09/13 2025 02/10/13 0345  NA 136 134*  K 4.5 3.9  CL 98 98  CO2 29 27  GLUCOSE 122* 99  BUN 13 11  CREATININE 0.84 0.79  CALCIUM 9.8 9.3  MG  --  1.9  PHOS  --  3.4   Liver Function Tests:  Recent Labs Lab 02/09/13 2025 02/10/13 0345  AST 38* 32  ALT 29 26  ALKPHOS 91 75  BILITOT 0.6 0.5  PROT 7.2 6.1  ALBUMIN 3.9 3.4*   No results found for this basename: LIPASE, AMYLASE,  in the last 168 hours No results found for this basename: AMMONIA,  in the last 168 hours CBC:  Recent Labs Lab 02/09/13 2025 02/10/13 0345  WBC 15.1* 12.7*  NEUTROABS 11.9*  --   HGB 14.4 12.8*  HCT 41.9 37.6*  MCV 90.1 90.4  PLT 220 188   Cardiac  Enzymes: No results found for this basename: CKTOTAL, CKMB, CKMBINDEX, TROPONINI,  in the last 168 hours BNP: BNP (last 3 results) No results found for this basename: PROBNP,  in the last 8760 hours CBG: No results found for this basename: GLUCAP,  in the last 168 hours  Time coordinating discharge: Over 30 minutes  Signed:  Manson Passey, MD  [facility] 02/12/2013, 11:18 AM  Pager #: [phone]

## 2013-02-13 NOTE — Progress Notes (Signed)
  Radiation Oncology         [phone] ________________________________  Name: Gabriel [name]  MRN: [id number]  Date: 02/09/2013  DOB: 1941/03/05  Chart Note:  Rib Biopsy confirmed metastatic adenocarcinoma.    Plan:  At this point, the patient is set up to proceed with CT simulation at [facility] tomorrow.  ________________________________  Artist Pais Kathrynn Running, M.D.

## 2013-02-15 ENCOUNTER — Encounter: Payer: Medicare Other | Admitting: Internal Medicine

## 2013-02-15 DIAGNOSIS — G893 Neoplasm related pain (acute) (chronic): Secondary | ICD-10-CM

## 2013-02-15 DIAGNOSIS — C3490 Malignant neoplasm of unspecified part of unspecified bronchus or lung: Secondary | ICD-10-CM

## 2013-02-21 ENCOUNTER — Encounter: Payer: Medicare Other | Admitting: Internal Medicine

## 2013-02-22 DIAGNOSIS — C7952 Secondary malignant neoplasm of bone marrow: Secondary | ICD-10-CM

## 2013-02-22 DIAGNOSIS — G893 Neoplasm related pain (acute) (chronic): Secondary | ICD-10-CM

## 2013-02-22 DIAGNOSIS — C3490 Malignant neoplasm of unspecified part of unspecified bronchus or lung: Secondary | ICD-10-CM

## 2013-02-22 DIAGNOSIS — C7951 Secondary malignant neoplasm of bone: Secondary | ICD-10-CM

## 2013-02-28 DIAGNOSIS — C3490 Malignant neoplasm of unspecified part of unspecified bronchus or lung: Secondary | ICD-10-CM

## 2013-02-28 DIAGNOSIS — C7951 Secondary malignant neoplasm of bone: Secondary | ICD-10-CM

## 2013-02-28 DIAGNOSIS — C7952 Secondary malignant neoplasm of bone marrow: Secondary | ICD-10-CM

## 2013-03-15 ENCOUNTER — Encounter: Payer: Medicare Other | Admitting: Internal Medicine

## 2013-03-15 DIAGNOSIS — G893 Neoplasm related pain (acute) (chronic): Secondary | ICD-10-CM

## 2013-03-15 DIAGNOSIS — C7952 Secondary malignant neoplasm of bone marrow: Secondary | ICD-10-CM

## 2013-03-15 DIAGNOSIS — C7951 Secondary malignant neoplasm of bone: Secondary | ICD-10-CM

## 2013-03-15 DIAGNOSIS — C3490 Malignant neoplasm of unspecified part of unspecified bronchus or lung: Secondary | ICD-10-CM

## 2013-03-24 DIAGNOSIS — Z5111 Encounter for antineoplastic chemotherapy: Secondary | ICD-10-CM

## 2013-03-24 DIAGNOSIS — C7951 Secondary malignant neoplasm of bone: Secondary | ICD-10-CM

## 2013-03-24 DIAGNOSIS — C3490 Malignant neoplasm of unspecified part of unspecified bronchus or lung: Secondary | ICD-10-CM

## 2013-03-24 DIAGNOSIS — C7952 Secondary malignant neoplasm of bone marrow: Secondary | ICD-10-CM

## 2013-03-25 DIAGNOSIS — Z5189 Encounter for other specified aftercare: Secondary | ICD-10-CM

## 2013-03-25 DIAGNOSIS — C7951 Secondary malignant neoplasm of bone: Secondary | ICD-10-CM

## 2013-03-25 DIAGNOSIS — C3490 Malignant neoplasm of unspecified part of unspecified bronchus or lung: Secondary | ICD-10-CM

## 2013-03-25 DIAGNOSIS — C7952 Secondary malignant neoplasm of bone marrow: Secondary | ICD-10-CM

## 2013-03-31 DIAGNOSIS — C3490 Malignant neoplasm of unspecified part of unspecified bronchus or lung: Secondary | ICD-10-CM

## 2013-03-31 DIAGNOSIS — C7951 Secondary malignant neoplasm of bone: Secondary | ICD-10-CM

## 2013-03-31 DIAGNOSIS — C7952 Secondary malignant neoplasm of bone marrow: Secondary | ICD-10-CM

## 2013-03-31 DIAGNOSIS — I10 Essential (primary) hypertension: Secondary | ICD-10-CM

## 2013-04-01 DIAGNOSIS — C7952 Secondary malignant neoplasm of bone marrow: Secondary | ICD-10-CM

## 2013-04-01 DIAGNOSIS — C3490 Malignant neoplasm of unspecified part of unspecified bronchus or lung: Secondary | ICD-10-CM

## 2013-04-01 DIAGNOSIS — C7951 Secondary malignant neoplasm of bone: Secondary | ICD-10-CM

## 2013-04-13 DIAGNOSIS — C3490 Malignant neoplasm of unspecified part of unspecified bronchus or lung: Secondary | ICD-10-CM

## 2013-04-13 DIAGNOSIS — C7952 Secondary malignant neoplasm of bone marrow: Secondary | ICD-10-CM

## 2013-04-13 DIAGNOSIS — C7951 Secondary malignant neoplasm of bone: Secondary | ICD-10-CM

## 2013-04-14 DIAGNOSIS — C7952 Secondary malignant neoplasm of bone marrow: Secondary | ICD-10-CM

## 2013-04-14 DIAGNOSIS — Z5112 Encounter for antineoplastic immunotherapy: Secondary | ICD-10-CM

## 2013-04-14 DIAGNOSIS — Z5111 Encounter for antineoplastic chemotherapy: Secondary | ICD-10-CM

## 2013-04-14 DIAGNOSIS — C7951 Secondary malignant neoplasm of bone: Secondary | ICD-10-CM

## 2013-04-14 DIAGNOSIS — C3490 Malignant neoplasm of unspecified part of unspecified bronchus or lung: Secondary | ICD-10-CM

## 2013-04-15 DIAGNOSIS — D702 Other drug-induced agranulocytosis: Secondary | ICD-10-CM

## 2013-05-02 DIAGNOSIS — C7952 Secondary malignant neoplasm of bone marrow: Secondary | ICD-10-CM

## 2013-05-02 DIAGNOSIS — C7951 Secondary malignant neoplasm of bone: Secondary | ICD-10-CM

## 2013-05-02 DIAGNOSIS — C3490 Malignant neoplasm of unspecified part of unspecified bronchus or lung: Secondary | ICD-10-CM

## 2013-05-04 DIAGNOSIS — D649 Anemia, unspecified: Secondary | ICD-10-CM

## 2013-05-04 DIAGNOSIS — C7951 Secondary malignant neoplasm of bone: Secondary | ICD-10-CM

## 2013-05-04 DIAGNOSIS — C3410 Malignant neoplasm of upper lobe, unspecified bronchus or lung: Secondary | ICD-10-CM

## 2013-05-04 DIAGNOSIS — C7952 Secondary malignant neoplasm of bone marrow: Secondary | ICD-10-CM

## 2013-05-04 DIAGNOSIS — R52 Pain, unspecified: Secondary | ICD-10-CM

## 2013-05-25 DIAGNOSIS — C3490 Malignant neoplasm of unspecified part of unspecified bronchus or lung: Secondary | ICD-10-CM

## 2013-05-25 DIAGNOSIS — C7951 Secondary malignant neoplasm of bone: Secondary | ICD-10-CM

## 2013-05-25 DIAGNOSIS — C7952 Secondary malignant neoplasm of bone marrow: Secondary | ICD-10-CM

## 2013-06-22 DIAGNOSIS — C3410 Malignant neoplasm of upper lobe, unspecified bronchus or lung: Secondary | ICD-10-CM

## 2013-06-22 DIAGNOSIS — C7952 Secondary malignant neoplasm of bone marrow: Secondary | ICD-10-CM

## 2013-06-22 DIAGNOSIS — C7951 Secondary malignant neoplasm of bone: Secondary | ICD-10-CM

## 2013-06-29 DIAGNOSIS — C7951 Secondary malignant neoplasm of bone: Secondary | ICD-10-CM

## 2013-06-29 DIAGNOSIS — C3490 Malignant neoplasm of unspecified part of unspecified bronchus or lung: Secondary | ICD-10-CM

## 2013-06-29 DIAGNOSIS — C7952 Secondary malignant neoplasm of bone marrow: Secondary | ICD-10-CM

## 2013-06-29 DIAGNOSIS — Z5111 Encounter for antineoplastic chemotherapy: Secondary | ICD-10-CM

## 2013-06-29 DIAGNOSIS — Z5112 Encounter for antineoplastic immunotherapy: Secondary | ICD-10-CM

## 2013-06-30 ENCOUNTER — Encounter (INDEPENDENT_AMBULATORY_CARE_PROVIDER_SITE_OTHER): Payer: Medicare Other

## 2013-06-30 DIAGNOSIS — E538 Deficiency of other specified B group vitamins: Secondary | ICD-10-CM

## 2013-06-30 DIAGNOSIS — C3490 Malignant neoplasm of unspecified part of unspecified bronchus or lung: Secondary | ICD-10-CM

## 2013-06-30 DIAGNOSIS — C7952 Secondary malignant neoplasm of bone marrow: Secondary | ICD-10-CM

## 2013-06-30 DIAGNOSIS — C7951 Secondary malignant neoplasm of bone: Secondary | ICD-10-CM

## 2013-06-30 DIAGNOSIS — C787 Secondary malignant neoplasm of liver and intrahepatic bile duct: Secondary | ICD-10-CM

## 2013-07-19 DIAGNOSIS — C7951 Secondary malignant neoplasm of bone: Secondary | ICD-10-CM

## 2013-07-19 DIAGNOSIS — Z23 Encounter for immunization: Secondary | ICD-10-CM

## 2013-07-19 DIAGNOSIS — C7952 Secondary malignant neoplasm of bone marrow: Secondary | ICD-10-CM

## 2013-07-19 DIAGNOSIS — C3410 Malignant neoplasm of upper lobe, unspecified bronchus or lung: Secondary | ICD-10-CM

## 2013-07-19 DIAGNOSIS — D649 Anemia, unspecified: Secondary | ICD-10-CM

## 2013-07-19 DIAGNOSIS — D696 Thrombocytopenia, unspecified: Secondary | ICD-10-CM

## 2013-07-28 DIAGNOSIS — Z5112 Encounter for antineoplastic immunotherapy: Secondary | ICD-10-CM

## 2013-07-28 DIAGNOSIS — C3490 Malignant neoplasm of unspecified part of unspecified bronchus or lung: Secondary | ICD-10-CM

## 2013-07-28 DIAGNOSIS — D649 Anemia, unspecified: Secondary | ICD-10-CM

## 2013-07-28 DIAGNOSIS — E538 Deficiency of other specified B group vitamins: Secondary | ICD-10-CM

## 2013-07-28 DIAGNOSIS — Z5111 Encounter for antineoplastic chemotherapy: Secondary | ICD-10-CM

## 2013-07-29 DIAGNOSIS — T451X5A Adverse effect of antineoplastic and immunosuppressive drugs, initial encounter: Secondary | ICD-10-CM

## 2013-07-29 DIAGNOSIS — D702 Other drug-induced agranulocytosis: Secondary | ICD-10-CM

## 2013-08-04 DIAGNOSIS — D649 Anemia, unspecified: Secondary | ICD-10-CM

## 2013-08-06 ENCOUNTER — Encounter (HOSPITAL_COMMUNITY): Payer: Self-pay | Admitting: *Deleted

## 2013-08-06 ENCOUNTER — Inpatient Hospital Stay (HOSPITAL_COMMUNITY): Payer: Medicare Other

## 2013-08-06 ENCOUNTER — Inpatient Hospital Stay (HOSPITAL_COMMUNITY)
Admission: AD | Admit: 2013-08-06 | Discharge: 2013-08-10 | DRG: 064 | Disposition: A | Discharge status: Rehabilitation Facility | Payer: Medicare Other | Source: Other Acute Inpatient Hospital | Attending: Neurology | Admitting: Neurology

## 2013-08-06 DIAGNOSIS — Z9221 Personal history of antineoplastic chemotherapy: Secondary | ICD-10-CM

## 2013-08-06 DIAGNOSIS — I6340 Cerebral infarction due to embolism of unspecified cerebral artery: Secondary | ICD-10-CM

## 2013-08-06 DIAGNOSIS — R339 Retention of urine, unspecified: Secondary | ICD-10-CM | POA: Diagnosis present

## 2013-08-06 DIAGNOSIS — I519 Heart disease, unspecified: Secondary | ICD-10-CM

## 2013-08-06 DIAGNOSIS — G8190 Hemiplegia, unspecified affecting unspecified side: Secondary | ICD-10-CM | POA: Diagnosis present

## 2013-08-06 DIAGNOSIS — Z79899 Other long term (current) drug therapy: Secondary | ICD-10-CM

## 2013-08-06 DIAGNOSIS — C3490 Malignant neoplasm of unspecified part of unspecified bronchus or lung: Secondary | ICD-10-CM | POA: Diagnosis present

## 2013-08-06 DIAGNOSIS — I214 Non-ST elevation (NSTEMI) myocardial infarction: Secondary | ICD-10-CM

## 2013-08-06 DIAGNOSIS — Z923 Personal history of irradiation: Secondary | ICD-10-CM

## 2013-08-06 DIAGNOSIS — I739 Peripheral vascular disease, unspecified: Secondary | ICD-10-CM | POA: Diagnosis present

## 2013-08-06 DIAGNOSIS — T451X5A Adverse effect of antineoplastic and immunosuppressive drugs, initial encounter: Secondary | ICD-10-CM | POA: Diagnosis present

## 2013-08-06 DIAGNOSIS — I6350 Cerebral infarction due to unspecified occlusion or stenosis of unspecified cerebral artery: Principal | ICD-10-CM | POA: Diagnosis present

## 2013-08-06 DIAGNOSIS — I252 Old myocardial infarction: Secondary | ICD-10-CM

## 2013-08-06 DIAGNOSIS — Z9282 Status post administration of tPA (rtPA) in a different facility within the last 24 hours prior to admission to current facility: Secondary | ICD-10-CM

## 2013-08-06 DIAGNOSIS — R29810 Facial weakness: Secondary | ICD-10-CM | POA: Diagnosis present

## 2013-08-06 DIAGNOSIS — R1310 Dysphagia, unspecified: Secondary | ICD-10-CM | POA: Diagnosis present

## 2013-08-06 DIAGNOSIS — D696 Thrombocytopenia, unspecified: Secondary | ICD-10-CM | POA: Diagnosis present

## 2013-08-06 DIAGNOSIS — I639 Cerebral infarction, unspecified: Secondary | ICD-10-CM

## 2013-08-06 DIAGNOSIS — R918 Other nonspecific abnormal finding of lung field: Secondary | ICD-10-CM

## 2013-08-06 DIAGNOSIS — Z87891 Personal history of nicotine dependence: Secondary | ICD-10-CM

## 2013-08-06 DIAGNOSIS — C7951 Secondary malignant neoplasm of bone: Secondary | ICD-10-CM | POA: Diagnosis present

## 2013-08-06 DIAGNOSIS — R7989 Other specified abnormal findings of blood chemistry: Secondary | ICD-10-CM

## 2013-08-06 DIAGNOSIS — I63511 Cerebral infarction due to unspecified occlusion or stenosis of right middle cerebral artery: Secondary | ICD-10-CM

## 2013-08-06 DIAGNOSIS — I2510 Atherosclerotic heart disease of native coronary artery without angina pectoris: Secondary | ICD-10-CM | POA: Diagnosis present

## 2013-08-06 DIAGNOSIS — H409 Unspecified glaucoma: Secondary | ICD-10-CM | POA: Diagnosis present

## 2013-08-06 DIAGNOSIS — I10 Essential (primary) hypertension: Secondary | ICD-10-CM | POA: Diagnosis present

## 2013-08-06 DIAGNOSIS — C412 Malignant neoplasm of vertebral column: Secondary | ICD-10-CM | POA: Diagnosis present

## 2013-08-06 HISTORY — DX: Non-ST elevation (NSTEMI) myocardial infarction: I21.4

## 2013-08-06 HISTORY — DX: Cerebral infarction, unspecified: I63.9

## 2013-08-06 HISTORY — DX: Shortness of breath: R06.02

## 2013-08-06 HISTORY — DX: Thrombocytopenia, unspecified: D69.6

## 2013-08-06 LAB — MRSA PCR SCREENING: MRSA by PCR: NEGATIVE

## 2013-08-06 LAB — TROPONIN I: Troponin I: 4.19 ng/mL (ref <0.30)

## 2013-08-06 MED ORDER — WHITE PETROLATUM GEL
Status: AC
  Filled 2013-08-06: qty 5

## 2013-08-06 MED ORDER — SODIUM CHLORIDE 0.9 % IV SOLN
INTRAVENOUS | Status: DC
  Administered 2013-08-07 – 2013-08-08 (×3): via INTRAVENOUS
  Administered 2013-08-08: 1000 mL via INTRAVENOUS
  Administered 2013-08-09: 11:00:00 via INTRAVENOUS
  Administered 2013-08-10: 1000 mL via INTRAVENOUS

## 2013-08-06 MED ORDER — ACETAMINOPHEN 325 MG PO TABS
650.0000 mg | ORAL_TABLET | ORAL | Status: DC | PRN
  Administered 2013-08-08 – 2013-08-10 (×3): 650 mg via ORAL
  Filled 2013-08-06 (×3): qty 2

## 2013-08-06 MED ORDER — ACETAMINOPHEN 650 MG RE SUPP: 650.0000 mg | RECTAL | Status: DC | PRN

## 2013-08-06 MED ORDER — SENNOSIDES-DOCUSATE SODIUM 8.6-50 MG PO TABS
1.0000 | ORAL_TABLET | Freq: Every evening | ORAL | Status: DC | PRN
  Filled 2013-08-06: qty 1

## 2013-08-06 MED ORDER — LABETALOL HCL 5 MG/ML IV SOLN: 10.0000 mg | INTRAVENOUS | Status: DC | PRN

## 2013-08-06 MED ORDER — PANTOPRAZOLE SODIUM 40 MG IV SOLR
40.0000 mg | Freq: Every day | INTRAVENOUS | Status: DC
  Administered 2013-08-06 – 2013-08-07 (×2): 40 mg via INTRAVENOUS
  Filled 2013-08-06 (×4): qty 40

## 2013-08-06 NOTE — Consult Note (Signed)
Referring Physician: ED    Chief Complaint: code stroke: dysarthria, left arm weakness, left face weakness.   HPI:                                                                                                                                         Gabriel [name] is an 72 y.o. male, right handed, with a past medical history significant for HTN, MI, metastatic lung cancer, transferred to [facility] after receiving IV tpa at [facility] ED earlier today. His wife tells me that he last saw him normal around 8 am today but review of his chart i normal at 8 am today and when she returned home around 1115 he was on the floor completely weak in the left sid. I was informed by the ED physician that his last seen normal was actually around 1130 am today and that his NIHSS was 5. According to his wife he was also having slurred speech and left face weakness. Never had similar symptoms before. Denies HA, vertigo, double vision, difficulty swallowing, confusion. CT brain showed no acute abnormality and thus we decided to treat with IV thrombolysis. Then, I was called again and was informed that patient had elevated troponin with normal CK-MB as well as normal EKG and that cardiology was already contacted.  Date last known well: 08/06/13  Time last known well: 8 am  tPA Given: yes NIHSS: 5 MRS: 3  Past Medical History  Diagnosis Date  . Cancer   . Hypertension   . Glaucoma   . Shortness of breath     with activity  . Stroke 08/06/2013    tpa given  . Myocardial infarction 08/06/2013    Past Surgical History  Procedure Laterality Date  . Cataract extraction, bilateral Bilateral 2009    Family History  Problem Relation Age of Onset  . Heart failure Mother   . Stroke Father   . Heart disease Brother    Social History:  reports that he has quit smoking. He does not have any smokeless tobacco history on file. He reports that he does not drink alcohol or use illicit drugs.  Allergies:   Allergies  Allergen Reactions  . Hydrocodone Rash    Medications:  I have reviewed the patient's current medications.  ROS:                                                                                                                                       History obtained from chart review, patient, and wife  General ROS: negative for - chills, fever, night sweats, weight gain Psychological ROS: negative for - behavioral disorder, hallucinations, memory difficulties, mood swings or suicidal ideation Ophthalmic ROS: negative for - blurry vision, double vision, eye pain or loss of vision ENT ROS: negative for - epistaxis, nasal discharge, oral lesions, sore throat, tinnitus or vertigo Allergy and Immunology ROS: negative for - hives or itchy/watery eyes Hematological and Lymphatic ROS: negative for - bleeding problems, bruising or swollen lymph nodes Endocrine ROS: negative for - galactorrhea, hair pattern changes, polydipsia/polyuria or temperature intolerance Respiratory ROS: negative for - hemoptysis, shortness of breath Cardiovascular ROS: negative for - chest pain, dyspnea on exertion, edema or irregular heartbeat Gastrointestinal ROS: negative for - abdominal pain, diarrhea, hematemesis, nausea/vomiting or stool incontinence Genito-Urinary ROS: negative for - dysuria, hematuria, incontinence or urinary frequency/urgency Musculoskeletal ROS: negative for - joint swelling Neurological ROS: as noted in HPI Dermatological ROS: negative for rash and skin lesion changes    Physical exam: pleasant male in no apparent distress. Blood pressure 138/61, pulse 87, temperature 97.7 F (36.5 C), temperature source Oral, resp. rate 26, height 5\' 11"  (1.803 m), weight 64.3 kg (141 lb 12.1 oz), SpO2 100.00%. Head: normocephalic. Neck: supple, no bruits, no JVD. Cardiac: no  murmurs. Lungs: clear. Abdomen: soft, no tender, no mass. Extremities: no edema.  Neurologic Examination:                                                                                                      Mental Status: Alert, oriented, thought content appropriate.  Mild dysarthria without evidence of aphasia.  Able to follow 3 step commands without difficulty. Cranial Nerves: II: Discs flat bilaterally; Visual fields grossly normal, pupils equal, round, reactive to light and accommodation III,IV, VI: ptosis not present, extra-ocular motions intact bilaterally V: facial light touch sensation normal bilaterally VII: mild left face weakness VIII: hearing normal bilaterally IX,X: gag reflex present XI: bilateral shoulder shrug XII: midline tongue extension Motor: Mild left HP, arm greater than leg Tone and bulk:normal tone throughout; no atrophy noted Sensory: Pinprick and light touch intact throughout, bilaterally Deep Tendon Reflexes:  Right: Upper Extremity   Left: Upper extremity   biceps (  C-5 to C-6) 2/4   biceps (C-5 to C-6) 2/4 tricep (C7) 2/4    triceps (C7) 2/4 Brachioradialis (C6) 2/4  Brachioradialis (C6) 2/4  Lower Extremity Lower Extremity  quadriceps (L-2 to L-4) 2/4   quadriceps (L-2 to L-4) 2/4 Achilles (S1) 2/4   Achilles (S1) 2/4  Plantars: Right: downgoing   Left: downgoing Cerebellar: normal finger-to-nose,  normal heel-to-shin test Gait: No ataxia. CV: pulses palpable throughout      No results found for this or any previous visit (from the past 48 hour(s)). No results found.    Assessment: 72 y.o. male with acute onset left sided weakness, slurred speech, and left face weakness. Initially evaluated at [facility] ED where he was reported to have NIHSS 5, unremarkable brain CT. IV tpa administered and transferred to [facility]. 1) Right brain stroke. Metastatic disease right brain no visualized on CT?. 2) Elevated troponin with normal CK-MB and  normal EKG: appreciate cardiology input/recommendations.  I was informed by the ED that patient was last seen normal at 1130 am but in reality he was last known well at 8 am. Complete stroke work up and follow post IV tpa protocol.   Stroke Risk Factors - age, CAD  Plan: 1. HgbA1c, fasting lipid panel 2. MRI, MRA  of the brain without contrast 3. Echocardiogram 4. Carotid dopplers 5. Prophylactic therapy-ASPIRIN IN 24 HOURS 6. Risk factor modification 7. Telemetry monitoring 8. Frequent neuro checks 9. PT/OT SLP  Wyatt Portela, MD [facility] [phone]  08/06/2013, 5:37 PM

## 2013-08-06 NOTE — Progress Notes (Signed)
CRITICAL VALUE ALERT  Critical value received: Troponin 4.19   Date of notification:  08/06/2013  Time of notification:  2009  Critical value read back: Yes  Nurse who received alert: Lily Peer  MD notified (1st page):  Algie Coffer  Time of first page:  2012   Responding MD:  Algie Coffer  Time MD responded: 2019

## 2013-08-06 NOTE — Consult Note (Signed)
CARDIOLOGY CONSULT NOTE   Patient ID: Gabriel [name] MRN: [id number] DOB/AGE: 1940-12-05 72 y.o.  Admit date: 08/06/2013  Primary Physician   No primary provider on file. Primary Cardiologist    None Reason for Consultation   Elevated Troponin  WUJ:WJXBJ Word is a 72 y.o. male with no history of CAD.  He was diagnosed with  CVA at [facility]. His troponin was elevated and cardiology was asked to evaluate him.  He has no known cardiac history. He denies history of atrial fibrillation or palpitations. He does not recall ever having had an ultrasound although he has been on chemotherapy I suspect there is a result at [facility].  Today he fell off the commode and was found by his wife 4 hours after she had last seen him (0800) unable to get up off the floor. 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 He has undergone radiation and chemotherapy for this.  He was taken to [facility] and at 1400 hours he was given TPA. His slurred speech has improved. His facial droop is not. It is not clear to me how much weakness he had a left side.   Past Medical History  Diagnosis Date  . Cancer   . Hypertension   . Glaucoma   . Shortness of breath     with activity  . Stroke 08/06/2013    tpa given  . Myocardial infarction 08/06/2013     Past Surgical History  Procedure Laterality Date  . Cataract extraction, bilateral Bilateral 2009    Allergies  Allergen Reactions  . Hydrocodone Rash    Prior to Admission medications   Medication Sig Start Date End Date Taking? Authorizing Provider  amLODipine (NORVASC) 10 MG tablet Take 1 tablet (10 mg total) by mouth daily. 02/12/13   Alison Murray, MD  dexamethasone (DECADRON) 6 MG tablet Take 1 tablet (6 mg total) by mouth every 6 (six) hours. 02/12/13   Alison Murray, MD  docusate sodium 100 MG CAPS Take 100 mg by mouth 2 (two)  times daily as needed for constipation. 02/12/13   Alison Murray, MD  Flaxseed, Linseed, (FLAX SEED OIL PO) Take 1 capsule by mouth daily.    Historical Provider, MD  HYDROcodone-acetaminophen (NORCO/VICODIN) 5-325 MG per tablet Take 1-2 tablets by mouth every 4 (four) hours as needed for pain. 02/12/13   Alison Murray, MD  ibuprofen (ADVIL,MOTRIN) 200 MG tablet Take 400 mg by mouth every 8 (eight) hours as needed for pain.    Historical Provider, MD  losartan (COZAAR) 100 MG tablet Take 1 tablet (100 mg total) by mouth daily. 02/12/13   Alison Murray, MD  morphine (MSIR) 30 MG tablet Take 1 tablet (30 mg total) by mouth every 4 (four) hours as needed for pain. 02/12/13   Alison Murray, MD  Multiple Vitamin (MULTIVITAMIN WITH MINERALS) TABS Take 1 tablet by mouth daily. 02/12/13   Alison Murray, MD  ondansetron (ZOFRAN) 4 MG tablet Take 1 tablet (4 mg total) by mouth every 6 (six) hours as needed for nausea. 02/12/13   Alison Murray, MD  timolol (TIMOPTIC) 0.5 % ophthalmic solution Place 1 drop into both eyes every morning. 02/12/13   Alison Murray, MD     History   Social History  . Marital Status: Married    Spouse Name: N/A  Number of Children: N/A  . Years of Education: N/A   Occupational History  . Not on file.   Social History Main Topics  . Smoking status: Former Games developer  . Smokeless tobacco: Not on file  . Alcohol Use: No  . Drug Use: No  . Sexual Activity: Not on file   Other Topics Concern  . Not on file   Social History Narrative  . No narrative on file    Family Status  Relation Status Death Age  . Mother Deceased   . Father Deceased   . Brother Alive   . Son Alive    Family History  Problem Relation Age of Onset  . Heart failure Mother   . Stroke Father   . Heart disease Brother      ROS:  Full 14 point review of systems complete and found to be negative unless listed above except for nausea which is attributed to his chemotherapy, shortness of breath secondary  to COPD  Physical Exam: Blood pressure 139/69, pulse 83, temperature 97.7 F (36.5 C), temperature source Oral, resp. rate 19, height 5\' 11"  (1.803 m), weight 141 lb 12.1 oz (64.3 kg), SpO2 100.00%.  General: Well developed, well nourished, male in no acute distress Head: Eyes PERRLA, No xanthomas.   Normocephalic and atraumatic, oropharynx without edema or exudate. Dentition:  Lungs:  Heart: HRRR S1 S2, no rub/gallop, Heart irregular rate and rhythm with S1, S2  murmur. pulses are 2+ extrem.   Neck: No carotid bruits. No lymphadenopathy.  JVD flat  Abdomen: Bowel sounds present, abdomen soft and non-tender without masses or hernias noted. Msk:  No spine or cva tenderness. No weakness, no joint deformities or effusions. Extremities: No clubbing or cyanosis.  edema.  Neuro: Alert and oriented X 3. Left facial droop left leg weakness Psych:  Good affect, responds appropriately Skin: No rashes or lesions noted.  Labs: Troponin 2.4 at [facility]  ECG:  Sinus with normal intervals and no QRS cahnges  Radiology:  No results found.  ASSESSMENT AND PLAN:   T   Active Problems:   Elevated troponin  The patient presents with a stroke and an elevated troponin. The relationship possibilities are numerous. Troponin elevations are seen in 15-30% of strokes. This is thought to be a consequence of sympathetic discharge. Another possibility is that he had an out of hospital MI and has thrown a clot.  Having reviewed with the neurologist, it is his impression that adjunct of heparin is at this time too risky. It appears he potentially could have had his TPA far outside the approved 4.5 hour window which increases bleeding risks. Further, his bleeding risks persisted for up to 36-48 hours. As his ECG is entirely normal, I favor the first explanation.  We'll plan to check troponins and get a 2-D echo. If he is a candidate for a beta blocker for blood pressure we can initiate that at some  juncture.  Signed: Sherryl Manges, MD 08/06/2013 4:36 PM

## 2013-08-06 NOTE — Progress Notes (Signed)
Pt has not voided since admit to [facility] this afternoon. Bladder scan done- 461 cc. Pt denies urge to void. MD notified. Does not wish to place foley this close to tpa administration. Will cont. to encourage pt to void and update MD as needed.

## 2013-08-07 ENCOUNTER — Inpatient Hospital Stay (HOSPITAL_COMMUNITY): Payer: Medicare Other

## 2013-08-07 DIAGNOSIS — I6350 Cerebral infarction due to unspecified occlusion or stenosis of unspecified cerebral artery: Secondary | ICD-10-CM

## 2013-08-07 DIAGNOSIS — I639 Cerebral infarction, unspecified: Secondary | ICD-10-CM | POA: Diagnosis present

## 2013-08-07 LAB — LIPID PANEL
Cholesterol: 157 mg/dL (ref 0–200)
HDL: 33 mg/dL — ABNORMAL LOW (ref >39)
Total CHOL/HDL Ratio: 4.8 RATIO
VLDL: 28 mg/dL (ref 0–40)

## 2013-08-07 LAB — HEMOGLOBIN A1C: Mean Plasma Glucose: 111 mg/dL (ref <117)

## 2013-08-07 MED ORDER — STROKE: EARLY STAGES OF RECOVERY BOOK
Freq: Once | Status: AC
  Administered 2013-08-07: 13:00:00
  Filled 2013-08-07: qty 1

## 2013-08-07 MED ORDER — TIMOLOL MALEATE 0.5 % OP SOLN
1.0000 [drp] | Freq: Every day | OPHTHALMIC | Status: DC
  Administered 2013-08-07 – 2013-08-10 (×4): 1 [drp] via OPHTHALMIC
  Filled 2013-08-07 (×2): qty 5

## 2013-08-07 MED ORDER — ASPIRIN EC 81 MG PO TBEC
81.0000 mg | DELAYED_RELEASE_TABLET | Freq: Every day | ORAL | Status: DC
  Administered 2013-08-07: 81 mg via ORAL
  Filled 2013-08-07 (×3): qty 1

## 2013-08-07 NOTE — Progress Notes (Signed)
   SUBJECTIVE:  He denies any chest pain.  No SOB   PHYSICAL EXAM Filed Vitals:   08/07/13 1000 08/07/13 1100 08/07/13 1130 08/07/13 1200  BP: 129/56 137/64 135/77 141/72  Pulse: 73 86 71 69  Temp:      TempSrc:      Resp: 20 15 18    Height:      Weight:      SpO2: 98% 99% 96% 95%   General:  No distress Lungs:  Clear Heart:  RRR Abdomen:  Positive bowel sounds, no rebound no guarding Extremities:  No edema  LABS: Lab Results  Component Value Date   TROPONINI 2.56* 08/07/2013   Results for orders placed during the hospital encounter of 08/06/13 (from the past 24 hour(s))  MRSA PCR SCREENING     Status: None   Collection Time    08/06/13  3:52 PM      Result Value Range   MRSA by PCR NEGATIVE  NEGATIVE  TROPONIN I     Status: Abnormal   Collection Time    08/06/13  6:43 PM      Result Value Range   Troponin I 4.19 (*) <0.30 ng/mL  TROPONIN I     Status: Abnormal   Collection Time    08/07/13 12:30 AM      Result Value Range   Troponin I 4.16 (*) <0.30 ng/mL  TROPONIN I     Status: Abnormal   Collection Time    08/07/13  5:45 AM      Result Value Range   Troponin I 2.56 (*) <0.30 ng/mL  HEMOGLOBIN A1C     Status: None   Collection Time    08/07/13  5:45 AM      Result Value Range   Hemoglobin A1C 5.5  <5.7 %   Mean Plasma Glucose 111  <117 mg/dL  LIPID PANEL     Status: Abnormal   Collection Time    08/07/13  5:45 AM      Result Value Range   Cholesterol 157  0 - 200 mg/dL   Triglycerides 147  <829 mg/dL   HDL 33 (*) >56 mg/dL   Total CHOL/HDL Ratio 4.8     VLDL 28  0 - 40 mg/dL   LDL Cholesterol 96  0 - 99 mg/dL    Intake/Output Summary (Last 24 hours) at 08/07/13 1207 Last data filed at 08/07/13 1020  Gross per 24 hour  Intake   1300 ml  Output   1300 ml  Net      0 ml     ASSESSMENT AND PLAN:  NSTEMI (non-ST elevated myocardial infarction):  Enzymes trending down.  Echo has not been done as the patient was out of the room getting an MRI.   No repeat EKG in the chart.  However, as the patient has no symptoms of ACS no change in therapy will be indicated. I will await the echo results and I will order a repeat EKG.  Stroke:  Residual left sided facial droop and weakness.  Per primary team.     Rollene Rotunda 08/07/2013 12:07 PM

## 2013-08-07 NOTE — Progress Notes (Signed)
Stroke Team Progress Note  HISTORY  Gabriel [name] is an 72 y.o. male, right handed, with a past medical history significant for HTN, MI, metastatic lung cancer, transferred to [facility] after receiving IV tpa at [facility] ED earlier today.  His wife tells me that he last saw him normal around 8 am today but review of his chart i normal at 8 am today and when she returned home around 1115 he was on the floor completely weak in the left sid. I was informed by the ED physician that his last seen normal was actually around 1130 am today and that his NIHSS was 5.  According to his wife he was also having slurred speech and left face weakness. Never had similar symptoms before.  Denies HA, vertigo, double vision, difficulty swallowing, confusion.  CT brain showed no acute abnormality and thus we decided to treat with IV thrombolysis.  Then, I was called again and was informed that patient had elevated troponin with normal CK-MB as well as normal EKG and that cardiology was already contacted.  Date last known well: 08/06/13  Time last known well: 8 am  tPA Given: yes  NIHSS: 5  MRS: 3   SUBJECTIVE His wife is at bedside. The patient does not complain of chest pain or SOB, or headache.  OBJECTIVE Most recent Vital Signs: Filed Vitals:   08/07/13 0400 08/07/13 0500 08/07/13 0600 08/07/13 0700  BP: 135/59 108/51 133/56 136/73  Pulse: 77 71 72 87  Temp: 98 F (36.7 C)     TempSrc: Oral     Resp: 16 15 17 18   Height:      Weight:  145 lb 4.5 oz (65.9 kg)    SpO2: 99% 98% 98% 98%   CBG (last 3)  No results found for this basename: GLUCAP,  in the last 72 hours  IV Fluid Intake:   . sodium chloride 75 mL/hr at 08/07/13 0007    MEDICATIONS  . pantoprazole (PROTONIX) IV  40 mg Intravenous QHS   PRN:  acetaminophen, acetaminophen, labetalol, senna-docusate  Diet:  NPO  Activity:  Bedrest DVT Prophylaxis:  SCD  CLINICALLY SIGNIFICANT STUDIES Basic Metabolic Panel: No results found for  this basename: NA, K, CL, CO2, GLUCOSE, BUN, CREATININE, CALCIUM, MG, PHOS,  in the last 168 hours Liver Function Tests: No results found for this basename: AST, ALT, ALKPHOS, BILITOT, PROT, ALBUMIN,  in the last 168 hours CBC: No results found for this basename: WBC, NEUTROABS, HGB, HCT, MCV, PLT,  in the last 168 hours Coagulation: No results found for this basename: LABPROT, INR,  in the last 168 hours Cardiac Enzymes:  Recent Labs Lab 08/06/13 1843 08/07/13 0030  TROPONINI 4.19* 4.16*   Urinalysis: No results found for this basename: COLORURINE, APPERANCEUR, LABSPEC, PHURINE, GLUCOSEU, HGBUR, BILIRUBINUR, KETONESUR, PROTEINUR, UROBILINOGEN, NITRITE, LEUKOCYTESUR,  in the last 168 hours Lipid Panel    Component Value Date/Time   CHOL 157 08/07/2013 0545   TRIG 139 08/07/2013 0545   HDL 33* 08/07/2013 0545   CHOLHDL 4.8 08/07/2013 0545   VLDL 28 08/07/2013 0545   LDLCALC 96 08/07/2013 0545   HgbA1C  No results found for this basename: HGBA1C    Urine Drug Screen:   No results found for this basename: labopia, cocainscrnur, labbenz, amphetmu, thcu, labbarb    Alcohol Level: No results found for this basename: ETH,  in the last 168 hours  Dg Chest 2 View  08/06/2013   CLINICAL DATA:  Stroke with left-sided  weakness.  EXAM: CHEST  2 VIEW  COMPARISON:  CHEST x-ray 08/06/2013.  FINDINGS: Lung volumes are low. No consolidative airspace disease. No pleural effusions. Mild diffuse peribronchial cuffing and interstitial prominence, similar to prior examinations. Known right upper lobe mass is similar to prior examinations emanating from the perihilar region. Destructive lesion of the lateral aspect of the right 5th rib again noted (the rib is expansile and diffusely lytic with focal soft tissue prominence in this region). Diffuse micronodularity is again noted. Bilateral focal pleuroparenchymal thickening, similar prior examinations, presumably chronic scarring. No acute consolidative  airspace disease. No evidence of pulmonary edema. No pleural effusions. Heart size is normal. Upper mediastinal contours are within normal limits.  IMPRESSION: 1. The appearance of the chest is similar to prior examinations demonstrating no definite acute cardiopulmonary disease, but evidence of primary right upper lobe neoplasm with metastatic disease, as above.   Electronically Signed   By: Trudie Reed M.D.   On: 08/06/2013 18:13    CT of the brain   Done at [facility], reported as negative  MRI of the brain    MRA of the brain    2D Echocardiogram    Carotid Doppler    CXR   IMPRESSION:  1. The appearance of the chest is similar to prior examinations  demonstrating no definite acute cardiopulmonary disease, but  evidence of primary right upper lobe neoplasm with metastatic  disease, as above. EKG    Therapy Recommendations pending  Physical Exam  General: The patient is alert and cooperative at the time of the examination.  Respiratory: lung fields are clear  Cardiovascular: There is a regular rhythm, no murmurs  Skin: No significant peripheral edema is noted.   Neurologic Exam  Cranial nerves:   Extraocular movements are full.  The patient has a left HH.Speech is dysarthric.  Motor: The patient has good strength in the right extremities. There is 4/5 strength in the left arm, and 4+/5 strength of the left leg.  Sensory: soft touch is symmetric on all fours and face.  Coordination: The patient has good finger-nose-finger and heel-to-shin on the right,  Dysmetria of the left arm, normal left leg.  Gait and station: The patient was not ambulated, at bed rest.  Reflexes: Deep tendon reflexes are symmetric.    ASSESSMENT Mr. Gabriel [name] is a 72 y.o. male presenting with right brain stroke. Not on antiplatelet medications at home. The patient received TPA at 14:00 yesterday. The patient has known metastatic lung cancer.  -right brain CVA -Metastatic  lung cancer -elevated troponin I, cardiology following -Hypertension -Urinary retension   Hospital day # 1  TREATMENT/PLAN  Aspirin therapy if MRI negative for bleed  Cardiology is following  2D echo  MRI brain  MRA head  Carotid doppler  ST to see for swallowing  PT and OT eval  Statin drug to be added. LDL 96    Gabriel [name] KEITH  08/07/2013 7:49 AM

## 2013-08-07 NOTE — Progress Notes (Signed)
PT Cancellation Note  Patient Details Name: Gabriel [name] MRN: [id number] DOB: 1941/01/15   Cancelled Treatment:    Reason Eval/Treat Not Completed: Medical issues which prohibited therapy Patient received tPA - bedrest.  Will return tomorrow for PT evaluation.  Vena Austria 08/07/2013, 4:28 PM Durenda Hurt. Renaldo Fiddler, [facility] Pager [phone]

## 2013-08-07 NOTE — Evaluation (Signed)
Clinical/Bedside Swallow Evaluation Patient Details  Name: Gabriel [name] MRN: [id number] Date of Birth: 18-Apr-1941  Today's Date: 08/07/2013 Time:  -     Past Medical History:  Past Medical History  Diagnosis Date  . Cancer   . Hypertension   . Glaucoma   . Shortness of breath     with activity  . Stroke 08/06/2013    tpa given  . Myocardial infarction 08/06/2013   Past Surgical History:  Past Surgical History  Procedure Laterality Date  . Cataract extraction, bilateral Bilateral 2009   HPI:    Jamisen Duerson is an 72 y.o. male, right handed, with a past medical history significant for HTN, MI, metastatic lung cancer, transferred to [facility] after receiving IV tpa at [facility] ED earlier today.  His wife tells me that he last saw him normal around 8 am today but review of his chart i normal at 8 am today and when she returned home around 1115 he was on the floor completely weak in the left sid. I was informed by the ED physician that his last seen normal was actually around 1130 am today and that his NIHSS was 5.  According to his wife he was also having slurred speech and left face weakness. Never had similar symptoms before.  Denies HA, vertigo, double vision, difficulty swallowing, confusion.  CT brain showed no acute abnormality and thus we decided to treat with IV thrombolysis.  Then, I was called again and was informed that patient had elevated troponin with normal CK-MB as well as normal EKG and that cardiology was already contacted.  Date last known well: 08/06/13 BSE indicated per stroke protocol.      Assessment / Plan / Recommendation Clinical Impression  Immediate + s/s of aspiration before and after swallow of thin water by spoon and cup due to marked delay and reduced laryngeal elevation.  Multiple swallows noted with puree consistency with immediate throat clears indicating weakness with probable penetration to cords. Recommend to proceed with objective  evaluation of MBS to objectively evaluate swallow, assess risk for aspiration, and to determine safest, PO diet.      Aspiration Risk  Severe    Diet Recommendation NPO   Medication Administration: Via alternative means    Other  Recommendations Recommended Consults: MBS Oral Care Recommendations: Oral care Q4 per protocol   Follow Up Recommendations  Inpatient Rehab    Frequency and Duration min 2x/week  2 weeks       SLP Swallow Goals Pending results of MBS   Swallow Study Prior Functional Status   No prior history of dysphagia     General   Rocklin Soderquist is an 72 y.o. male, right handed, with a past medical history significant for HTN, MI, metastatic lung cancer, transferred to [facility] after receiving IV tpa at [facility] ED earlier today.  His wife tells me that he last saw him normal around 8 am today but review of his chart i normal at 8 am today and when she returned home around 1115 he was on the floor completely weak in the left sid. I was informed by the ED physician that his last seen normal was actually around 1130 am today and that his NIHSS was 5.  According to his wife he was also having slurred speech and left face weakness. Never had similar symptoms before.  Denies HA, vertigo, double vision, difficulty swallowing, confusion.  CT brain showed no acute abnormality and thus we decided to treat with  IV thrombolysis.  Then, I was called again and was informed that patient had elevated troponin with normal CK-MB as well as normal EKG and that cardiology was already contacted.  Date last known well: 08/06/13     Oral/Motor/Sensory Function Overall Oral Motor/Sensory Function: Impaired Labial ROM: Reduced left Labial Symmetry: Abnormal symmetry left Labial Strength: Reduced Labial Sensation: Reduced Lingual ROM: Reduced left Lingual Symmetry: Abnormal symmetry left Lingual Strength: Reduced Lingual Sensation: Reduced Facial ROM: Reduced left Facial Symmetry:  Left droop Facial Strength: Reduced Facial Sensation: Reduced Velum: Within Functional Limits Mandible: Within Functional Limits   Ice Chips Ice chips: Impaired Pharyngeal Phase Impairments: Suspected delayed Swallow;Decreased hyoid-laryngeal movement;Throat Clearing - Immediate;Cough - Immediate   Thin Liquid Thin Liquid: Impaired Presentation: Cup;Spoon Pharyngeal  Phase Impairments: Wet Vocal Quality;Suspected delayed Swallow;Decreased hyoid-laryngeal movement;Multiple swallows;Cough - Immediate;Throat Clearing - Immediate    Nectar Thick Nectar Thick Liquid: Not tested   Honey Thick Honey Thick Liquid: Not tested   Puree Puree: Impaired Pharyngeal Phase Impairments: Suspected delayed Swallow;Decreased hyoid-laryngeal movement;Multiple swallows;Throat Clearing - Delayed   Solid   GO    Solid: Not tested      Moreen Fowler MS, CCC-SLP [phone] [facility] 08/07/2013,12:22 PM

## 2013-08-07 NOTE — Procedures (Signed)
Objective Swallowing Evaluation: Modified Barium Swallowing Study  Patient Details  Name: Gabriel [name] MRN: [id number] Date of Birth: [birth year]  Today's Date: 08/07/2013 Time:10:30 to 11:00   Past Medical History:  Past Medical History  Diagnosis Date  . Cancer   . Hypertension   . Glaucoma   . Shortness of breath     with activity  . Stroke 08/06/2013    tpa given  . Myocardial infarction 08/06/2013   Past Surgical History:  Past Surgical History  Procedure Laterality Date  . Cataract extraction, bilateral Bilateral 2009   HPI:  Gabriel [name] is an 72 y.o. male, right handed, with a past medical history significant for HTN, MI, metastatic lung cancer, transferred to [facility] after receiving IV tpa at [facility] ED earlier today.  MBS indicated following results of BSE.      Assessment / Plan / Recommendation Clinical Impression  Dysphagia Diagnosis: Mild oral phase dysphagia;Moderate pharyngeal phase dysphagia;Mild cervical esophageal phase dysphagia Significant amount of aspiration during and after swallow of thin liquid by spoon and cup.  Sensed aspiration but ineffective cough to clear aspirated material.  Barium containment in vallecular space s/p swallow of all consistencies.  Strategy of double swallow effective in clearing residue.  Brief esophageal sweep indicates slow bolus transit with significant amount of containment in distal esophagus.  Appeared to be narrowing in esophagus but no radiologist present to confirm.  Recommend to proceed with dysphagia 2 ( finely chopped) diet consistency with nectar thick liquids with full supervision with all meals.  Diagnostic treatment completed s/p study focusing on providing education to patient and caregiver on diet rec's and necessary swallow strategies to decrease risk for aspiration.  ST to follow for diet tolerance and possible advancement.  Repeat MBS in 2 to 3 days with clinical improvement prior to advancing liquids if  indicated.      Treatment Recommendation  F/U MBS in ___ days (Comment) (2 to 3 days with clinical improvement)    Diet Recommendation Dysphagia 2 (Fine chop);Nectar-thick liquid   Liquid Administration via: Cup;No straw Medication Administration: Whole meds with puree Supervision: Patient able to self feed;Full supervision/cueing for compensatory strategies Compensations: Slow rate;Small sips/bites;Follow solids with liquid;Multiple dry swallows after each bite/sip Postural Changes and/or Swallow Maneuvers: Seated upright 90 degrees;Upright 30-60 min after meal    Other  Recommendations Recommended Consults: MBS Oral Care Recommendations: Oral care Q4 per protocol Other Recommendations: Order thickener from pharmacy;Prohibited food (jello, ice cream, thin soups);Remove water pitcher;Have oral suction available;Clarify dietary restrictions   Follow Up Recommendations  Inpatient Rehab    Frequency and Duration min 2x/week  2 weeks       SLP Swallow Goals     General Date of Onset: 08/06/13 HPI: Gabriel [name] is an 72 y.o. male, right handed, with a past medical history significant for HTN, MI, metastatic lung cancer, transferred to [facility] after receiving IV tpa at [facility] ED earlier today.   Type of Study: Modified Barium Swallowing Study Reason for Referral: Objectively evaluate swallowing function Diet Prior to this Study: NPO Temperature Spikes Noted: No Respiratory Status: Room air History of Recent Intubation: No Behavior/Cognition: Alert;Cooperative;Pleasant mood;Distractible;Requires cueing Oral Cavity - Dentition: Missing dentition Oral Motor / Sensory Function: Impaired - see Bedside swallow eval Self-Feeding Abilities: Able to feed self;Needs assist Patient Positioning: Upright in chair Baseline Vocal Quality: Clear Volitional Cough: Strong Volitional Swallow: Able to elicit Anatomy: Within functional limits Pharyngeal Secretions: Not observed secondary MBS     Reason for  Referral Objectively evaluate swallowing function   Oral Phase Oral Preparation/Oral Phase Oral Phase: Impaired Oral - Nectar Oral - Nectar Teaspoon: Weak lingual manipulation;Piecemeal swallowing;Holding of bolus Oral - Nectar Cup: Holding of bolus;Weak lingual manipulation;Piecemeal swallowing Oral - Thin Oral - Thin Teaspoon: Weak lingual manipulation;Delayed oral transit;Piecemeal swallowing Oral - Thin Cup: Holding of bolus;Weak lingual manipulation;Piecemeal swallowing;Reduced posterior propulsion;Delayed oral transit Oral - Solids Oral - Puree: Reduced posterior propulsion;Weak lingual manipulation;Piecemeal swallowing;Delayed oral transit Oral - Mechanical Soft: Delayed oral transit;Piecemeal swallowing;Reduced posterior propulsion;Weak lingual manipulation   Pharyngeal Phase Pharyngeal Phase Pharyngeal Phase: Impaired Pharyngeal - Nectar Pharyngeal - Nectar Teaspoon: Premature spillage to valleculae;Reduced anterior laryngeal mobility;Reduced airway/laryngeal closure;Reduced tongue base retraction;Pharyngeal residue - valleculae;Reduced pharyngeal peristalsis Pharyngeal - Nectar Cup: Premature spillage to valleculae;Reduced anterior laryngeal mobility;Reduced pharyngeal peristalsis;Reduced laryngeal elevation;Reduced tongue base retraction;Reduced airway/laryngeal closure;Pharyngeal residue - valleculae Pharyngeal - Thin Pharyngeal - Thin Teaspoon: Premature spillage to valleculae;Reduced anterior laryngeal mobility;Reduced laryngeal elevation;Reduced pharyngeal peristalsis;Reduced airway/laryngeal closure;Reduced tongue base retraction;Penetration/Aspiration before swallow;Penetration/Aspiration during swallow;Significant aspiration (Amount);Compensatory strategies attempted (Comment) (chin tuck not effective ) Penetration/Aspiration details (thin teaspoon): Material enters airway, passes BELOW cords and not ejected out despite cough attempt by patient Pharyngeal - Thin  Cup: Premature spillage to valleculae;Reduced pharyngeal peristalsis;Reduced anterior laryngeal mobility;Reduced laryngeal elevation;Reduced tongue base retraction;Penetration/Aspiration during swallow;Penetration/Aspiration before swallow;Significant aspiration (Amount) Penetration/Aspiration details (thin cup): Material enters airway, passes BELOW cords and not ejected out despite cough attempt by patient Pharyngeal - Solids Pharyngeal - Puree: Premature spillage to valleculae;Reduced anterior laryngeal mobility;Reduced pharyngeal peristalsis;Reduced laryngeal elevation;Reduced tongue base retraction;Reduced airway/laryngeal closure Pharyngeal - Mechanical Soft: Premature spillage to valleculae;Reduced pharyngeal peristalsis;Reduced anterior laryngeal mobility;Reduced laryngeal elevation;Reduced airway/laryngeal closure;Reduced tongue base retraction;Pharyngeal residue - valleculae  Cervical Esophageal Phase    GO    Cervical Esophageal Phase Cervical Esophageal Phase: Impaired Cervical Esophageal Phase - Comment Cervical Esophageal Comment: whole barium tablet lodged in distal esophagus requiring multiple dry swallows to complete transit        Moreen Fowler MS, CCC-SLP [phone] [facility] 08/07/2013, 12:40 PM

## 2013-08-07 NOTE — Progress Notes (Signed)
*  PRELIMINARY RESULTS* Vascular Ultrasound Carotid Duplex (Doppler) has been completed. Findings suggest 1-39% right internal carotid artery stenosis and elevated peak systolic velocities of the left proximal internal carotid artery suggestive of 40-59% stenosis, however end diastolic velocities and ICA/CCA ratio suggest upper range 1-39% stenosis. Vertebral arteries are patent with antegrade flow.  08/07/2013 3:47 PM Gertie Fey, RVT, RDCS, RDMS

## 2013-08-08 ENCOUNTER — Encounter (HOSPITAL_COMMUNITY): Payer: Self-pay | Admitting: Internal Medicine

## 2013-08-08 DIAGNOSIS — I519 Heart disease, unspecified: Secondary | ICD-10-CM

## 2013-08-08 DIAGNOSIS — I6340 Cerebral infarction due to embolism of unspecified cerebral artery: Secondary | ICD-10-CM

## 2013-08-08 DIAGNOSIS — R222 Localized swelling, mass and lump, trunk: Secondary | ICD-10-CM

## 2013-08-08 DIAGNOSIS — I6330 Cerebral infarction due to thrombosis of unspecified cerebral artery: Secondary | ICD-10-CM

## 2013-08-08 LAB — CBC
HCT: 29.4 % — ABNORMAL LOW (ref 39.0–52.0)
MCV: 98.7 fL (ref 78.0–100.0)
Platelets: 26 10*3/uL — CL (ref 150–400)
RBC: 2.98 MIL/uL — ABNORMAL LOW (ref 4.22–5.81)
RDW: 18 % — ABNORMAL HIGH (ref 11.5–15.5)
WBC: 16.4 10*3/uL — ABNORMAL HIGH (ref 4.0–10.5)

## 2013-08-08 LAB — COMPREHENSIVE METABOLIC PANEL
Albumin: 2.9 g/dL — ABNORMAL LOW (ref 3.5–5.2)
Alkaline Phosphatase: 270 U/L — ABNORMAL HIGH (ref 39–117)
BUN: 7 mg/dL (ref 6–23)
Calcium: 8.1 mg/dL — ABNORMAL LOW (ref 8.4–10.5)
Chloride: 98 mEq/L (ref 96–112)
Creatinine, Ser: 0.56 mg/dL (ref 0.50–1.35)
GFR calc Af Amer: >90 mL/min (ref >90)
Glucose, Bld: 80 mg/dL (ref 70–99)
Potassium: 3.8 mEq/L (ref 3.5–5.1)
Total Bilirubin: 0.6 mg/dL (ref 0.3–1.2)
Total Protein: 6.2 g/dL (ref 6.0–8.3)

## 2013-08-08 LAB — CBC WITH DIFFERENTIAL/PLATELET
Basophils Absolute: 0 10*3/uL (ref 0.0–0.1)
Basophils Relative: 0 % (ref 0–1)
Eosinophils Absolute: 0 10*3/uL (ref 0.0–0.7)
Eosinophils Relative: 0 % (ref 0–5)
HCT: 28.2 % — ABNORMAL LOW (ref 39.0–52.0)
Hemoglobin: 9.2 g/dL — ABNORMAL LOW (ref 13.0–17.0)
MCH: 32.2 pg (ref 26.0–34.0)
MCHC: 32.6 g/dL (ref 30.0–36.0)
MCV: 98.6 fL (ref 78.0–100.0)
Monocytes Absolute: 0.8 10*3/uL (ref 0.1–1.0)
Neutrophils Relative %: 85 % — ABNORMAL HIGH (ref 43–77)
Platelets: 29 10*3/uL — CL (ref 150–400)
RDW: 18 % — ABNORMAL HIGH (ref 11.5–15.5)
WBC Morphology: INCREASED

## 2013-08-08 MED ORDER — RESOURCE THICKENUP CLEAR PO POWD
ORAL | Status: DC | PRN
  Filled 2013-08-08: qty 125

## 2013-08-08 MED ORDER — ATORVASTATIN CALCIUM 80 MG PO TABS
80.0000 mg | ORAL_TABLET | Freq: Every day | ORAL | Status: DC
  Administered 2013-08-08 – 2013-08-09 (×2): 80 mg via ORAL
  Filled 2013-08-08 (×3): qty 1

## 2013-08-08 MED ORDER — LORATADINE 10 MG PO TABS
10.0000 mg | ORAL_TABLET | Freq: Every day | ORAL | Status: DC
  Administered 2013-08-08 – 2013-08-10 (×3): 10 mg via ORAL
  Filled 2013-08-08 (×3): qty 1

## 2013-08-08 MED ORDER — FENTANYL 25 MCG/HR TD PT72
25.0000 ug | MEDICATED_PATCH | TRANSDERMAL | Status: DC
  Administered 2013-08-08: 25 ug via TRANSDERMAL

## 2013-08-08 MED ORDER — FENTANYL 25 MCG/HR TD PT72
100.0000 ug | MEDICATED_PATCH | TRANSDERMAL | Status: DC
  Administered 2013-08-08: 100 ug via TRANSDERMAL
  Filled 2013-08-08 (×3): qty 4

## 2013-08-08 MED ORDER — PANTOPRAZOLE SODIUM 40 MG PO TBEC
40.0000 mg | DELAYED_RELEASE_TABLET | Freq: Every day | ORAL | Status: DC
  Administered 2013-08-08 – 2013-08-09 (×2): 40 mg via ORAL
  Filled 2013-08-08 (×2): qty 1

## 2013-08-08 NOTE — Progress Notes (Signed)
Speech Language Pathology Treatment: Dysphagia  Patient Details Name: Jermichael Belmares MRN: [id number] DOB: 12/30/1940 Today's Date: 08/08/2013 Time: 0865-7846 SLP Time Calculation (min): 17 min  Assessment / Plan / Recommendation Clinical Impression  Pt seen for skilled SLP intervention to assess tolerance of po diet and for education purposes.  Pt did not recall having MBS done yesterday nor results of testing.  SLP observed pt eating lunch including pork, chopped vegetables, buttermilk and V8 juice requiring moderate verbal cues to conduct dry swallows, assess for oral stasis and slow rate of intake.  Pt only consumed approx 1/2 of meat and vegetables and declined to consume pureed pears.  Overt cough when consuming thickened water (not thickened to nectar), suspect aspiration.  Do not recommend repeat MBS at this time due to concern for ongoing aspiration of thin liquids.    Per interview with pt, he admits to issues with cough during intake for "years"- ? Some baseline dysphagia based on his report.     Rec continue diet with strict aspiration precautions including dietary restrictions and compensation strategies.  SLP used teach back to assure pt understands dysphagia compensations and he was able to state precautions immediately but suspect decreased ability for generalization therefore full supervision continues to be indicated.     HPI  please see BSE and MBS   Pertinent Vitals Afebrile,decreased  SLP Plan  Continue with current plan of care    Recommendations Diet recommendations: Dysphagia 2 (fine chop);Nectar-thick liquid Liquids provided via: Cup Medication Administration: Whole meds with puree (follow with liquids) Supervision: Full supervision/cueing for compensatory strategies Compensations: Slow rate;Small sips/bites;Check for pocketing;Multiple dry swallows after each bite/sip Postural Changes and/or Swallow Maneuvers: Seated upright 90 degrees;Upright 30-60 min after meal               Oral Care Recommendations: Oral care before and after PO Follow up Recommendations: Inpatient Rehab Plan: Continue with current plan of care    GO    Donavan Burnet, MS [facility] SLP [phone]

## 2013-08-08 NOTE — Progress Notes (Signed)
CRITICAL VALUE ALERT  Critical value received:  Platelet - 29  Date of notification:  08/08/2013  Time of notification:  0716  Critical value read back:yes  Nurse who received alert:  Elie Goody, RN  MD notified (1st page):  Dr. Leroy Kennedy  Time of first page:  [phone]  MD notified (2nd page):  Time of second page:  Responding MD:  Dr. Leroy Kennedy  Time MD responded:  0730

## 2013-08-08 NOTE — Progress Notes (Signed)
  Echocardiogram 2D Echocardiogram has been performed.  Gabriel [name] FRANCES 08/08/2013, 12:41 PM

## 2013-08-08 NOTE — Progress Notes (Signed)
I will begin approval process to admit pt to inpt rehab once medically ready. [phone]

## 2013-08-08 NOTE — Evaluation (Signed)
Physical Therapy Evaluation Patient Details Name: Gabriel [name] MRN: [id number] DOB: [birth year] Today's Date: 08/08/2013 Time: 4098-1191 PT Time Calculation (min): 31 min  PT Assessment / Plan / Recommendation History of Present Illness  Admitted with L sided weakness, elevated troponins; R CVA and NATEMI  Clinical Impression  Pt admitted with above. Pt currently with functional limitations due to the deficits listed below (see PT Problem List).  Pt will benefit from skilled PT to increase their independence and safety with mobility to allow discharge to the venue listed below.       PT Assessment  Patient needs continued PT services    Follow Up Recommendations  CIR    Does the patient have the potential to tolerate intense rehabilitation      Barriers to Discharge        Equipment Recommendations  Rolling walker with 5" wheels;3in1 (PT)    Recommendations for Other Services Rehab consult   Frequency Min 4X/week    Precautions / Restrictions Precautions Precautions: Fall   Pertinent Vitals/Pain no apparent distress       Mobility  Bed Mobility Bed Mobility: Supine to Sit Supine to Sit: 3: Mod assist Details for Bed Mobility Assistance: Required heavy mod assist to fully achieve midline from L semi-sidelie Transfers Transfers: Sit to Stand;Stand to Sit Sit to Stand: 3: Mod assist;From bed;With upper extremity assist Stand to Sit: 2: Max assist;To chair/3-in-1 Details for Transfer Assistance: Pt has enough LE muscle power (especially RLE) for sit to stand with light mod assist mostly for steadiness; Required max assist to safely sit to chair as he has significant difficulty controlling his center of mass over his feet with the dynamic acitivity of walking, and he to sit unexpectedly because of loss of balance Ambulation/Gait Ambulation/Gait Assistance: Other (comment) (attempted) Ambulation Distance (Feet):  (2 steps) Assistive device: Rolling  walker Ambulation/Gait Assistance Details: significant loss of balance to left with 1-2 steps, requiring max assist to safely sit to chair on pt's left Modified Rankin (Stroke Patients Only) Pre-Morbid Rankin Score: No significant disability Modified Rankin: Severe disability    Exercises     PT Diagnosis: Difficulty walking;Hemiplegia non-dominant side  PT Problem List: Decreased strength;Decreased activity tolerance;Decreased balance;Decreased mobility;Decreased coordination;Decreased cognition;Decreased knowledge of use of DME;Decreased safety awareness;Decreased knowledge of precautions;Impaired sensation PT Treatment Interventions: DME instruction;Gait training;Stair training;Functional mobility training;Therapeutic activities;Therapeutic exercise;Balance training;Neuromuscular re-education;Cognitive remediation;Patient/family education     PT Goals(Current goals can be found in the care plan section) Acute Rehab PT Goals Patient Stated Goal: to walk PT Goal Formulation: With patient Time For Goal Achievement: 08/22/13 Potential to Achieve Goals: Good  Visit Information  Last PT Received On: 08/08/13 Assistance Needed: +2 History of Present Illness: Admitted with L sided weakness, elevated troponins; R CVA and NATEMI       Prior Functioning  Home Living Family/patient expects to be discharged to:: Inpatient rehab (states he'll dc to "wherever to recommend") Living Arrangements: Spouse/significant other  Lives With: Spouse Prior Function Level of Independence: Independent with assistive device(s) Comments: Reports uses a RW and/or cane for amb Communication Communication: No difficulties (though noted some difficulty understanding pt) Dominant Hand: Right    Cognition  Cognition Arousal/Alertness: Awake/alert Behavior During Therapy: WFL for tasks assessed/performed Overall Cognitive Status: Impaired/Different from baseline Area of Impairment:  Awareness;Safety/judgement Safety/Judgement: Decreased awareness of deficits    Extremity/Trunk Assessment Upper Extremity Assessment Upper Extremity Assessment: Defer to OT evaluation;LUE deficits/detail LUE Deficits / Details: Showing some neglect of LUE LUE Coordination: decreased fine  motor;decreased gross motor Lower Extremity Assessment Lower Extremity Assessment: LLE deficits/detail LLE Deficits / Details: Noteable weakness; 3/5 hip flexors, 3/5 quad LLE Coordination: decreased gross motor   Balance Balance Balance Assessed: Yes Static Sitting Balance Static Sitting - Balance Support: Right upper extremity supported;Feet supported Static Sitting - Level of Assistance: 3: Mod assist Static Sitting - Comment/# of Minutes: Up to mod assist to achieve midline orientation in static sitting; significant Left lean; Noted tendency for LUE neglect  End of Session PT - End of Session Equipment Utilized During Treatment: Gait belt Activity Tolerance: Patient tolerated treatment well Patient left: in chair;with call bell/phone within reach;with chair alarm set Nurse Communication: Mobility status  GP     [name], [name] [phone]  08/08/2013, 1:17 PM

## 2013-08-08 NOTE — Consult Note (Signed)
Physical Medicine and Rehabilitation Consult Reason for Consult: CVA Referring Physician: Dr. Pearlean Brownie   HPI: Gabriel [name] is a 72 y.o. right-handed male with history of hypertension, chronic back pain, metastatic lung cancer and has been receiving chemotherapy radiation therapy. Patient used a cane and/or a walker prior to admission due to chronic back pain. Admitted to [facility] 08/06/2013 with left-sided weakness and facial droop. Initial cranial CT scan showed no acute abnormalities. TPA was administered. Findings of elevated troponin and normal CK-MB as well as no EKG. Patient was transferred to [name] for ongoing evaluation. MRI of the brain showed moderate-sized acute nonhemorrhagic infarct right periopercular region, right subinsular region as well as lenticular nucleus and caudate. MRA of the head with atherosclerotic type changes. Carotid Dopplers with left 40-59% ICA stenosis. Cardiology services followup for elevated troponin suspect non-ST elevated myocardial infarction with echocardiogram pending. Patient is currently maintained on aspirin therapy. Dysphagia 2 nectar liquids and followup speech therapy. From a physical and occupational therapy evaluations are pending. M.D. is requested physical medicine rehabilitation consult to consider inpatient rehabilitation services   Review of Systems  Respiratory: Positive for shortness of breath.   Musculoskeletal: Positive for back pain.  Psychiatric/Behavioral:       Anxiety  All other systems reviewed and are negative.   Past Medical History  Diagnosis Date  . Cancer   . Hypertension   . Glaucoma   . Shortness of breath     with activity  . Stroke 08/06/2013    tpa given  . NSTEMI (non-ST elevated myocardial infarction) 08/06/2013   Past Surgical History  Procedure Laterality Date  . Cataract extraction, bilateral Bilateral 2009   Family History  Problem Relation Age of Onset  . Heart failure Mother   .  Stroke Father   . Heart disease Brother    Social History:  reports that he has quit smoking. He does not have any smokeless tobacco history on file. He reports that he does not drink alcohol or use illicit drugs. Allergies:  Allergies  Allergen Reactions  . Hydrocodone Rash   Medications Prior to Admission  Medication Sig Dispense Refill  . ALPRAZolam (XANAX) 1 MG tablet Take 1 tablet by mouth 3 (three) times daily as needed for anxiety.       Marland Kitchen amLODipine (NORVASC) 10 MG tablet Take 1 tablet (10 mg total) by mouth daily.  30 tablet  3  . dexamethasone (DECADRON) 2 MG tablet Take 2 mg by mouth 2 (two) times daily with a meal.      . docusate sodium 100 MG CAPS Take 100 mg by mouth 2 (two) times daily as needed for constipation.  60 capsule  3  . fentaNYL (DURAGESIC - DOSED MCG/HR) 100 MCG/HR Place 1 patch onto the skin every 3 (three) days.      . fentaNYL (DURAGESIC - DOSED MCG/HR) 25 MCG/HR patch Place 1 patch onto the skin every 3 (three) days.      . Flaxseed, Linseed, (FLAX SEED OIL PO) Take 1 capsule by mouth daily.      Marland Kitchen HYDROcodone-acetaminophen (NORCO) 10-325 MG per tablet Take 1 tablet by mouth every 6 (six) hours as needed for pain.      Marland Kitchen ibuprofen (ADVIL,MOTRIN) 200 MG tablet Take 400 mg by mouth every 8 (eight) hours as needed for pain.      Marland Kitchen losartan (COZAAR) 100 MG tablet Take 1 tablet (100 mg total) by mouth daily.  30 tablet  3  .  Multiple Vitamin (MULTIVITAMIN WITH MINERALS) TABS Take 1 tablet by mouth daily.  30 tablet  3  . ondansetron (ZOFRAN) 4 MG tablet Take 1 tablet (4 mg total) by mouth every 6 (six) hours as needed for nausea.  45 tablet  0  . timolol (TIMOPTIC) 0.5 % ophthalmic solution Place 1 drop into both eyes every morning.  15 mL  4    Home: Home Living Living Arrangements: Spouse/significant other  Functional History:   Functional Status:  Mobility:          ADL:    Cognition: Cognition Orientation Level: Oriented X4    Blood  pressure 149/74, pulse 83, temperature 97.6 F (36.4 C), temperature source Oral, resp. rate 18, height 5\' 11"  (1.803 m), weight 65.9 kg (145 lb 4.5 oz), SpO2 100.00%. Physical Exam  Constitutional: He is oriented to person, place, and time.  HENT:  Head: Normocephalic.  Eyes: EOM are normal.  Neck: Normal range of motion. Neck supple. No thyromegaly present.  Cardiovascular: Normal rate and regular rhythm.   Respiratory: Effort normal and breath sounds normal. No respiratory distress.  GI: Soft. Bowel sounds are normal. He exhibits no distension.  Musculoskeletal: He exhibits no edema.  Neurological: He is alert and oriented to person, place, and time. He displays normal reflexes.  Speech is mildly dysarthric but fully intelligible. Left facial droop and tongue deviation. LUE is 3+/5 to 4-. Decreased fmc and +PD on the left. LLE is 3- HF, KE, and 3/5 at the ankle with ADF,APF. Sensory exam intact to PP and LT bilaterally.    Skin: Skin is warm and dry.  Psychiatric: He has a normal mood and affect. His behavior is normal. Judgment and thought content normal.    Results for orders placed during the hospital encounter of 08/06/13 (from the past 24 hour(s))  CBC WITH DIFFERENTIAL     Status: Abnormal   Collection Time    08/08/13  5:25 AM      Result Value Range   WBC 15.0 (*) 4.0 - 10.5 K/uL   RBC 2.86 (*) 4.22 - 5.81 MIL/uL   Hemoglobin 9.2 (*) 13.0 - 17.0 g/dL   HCT 16.1 (*) 09.6 - 04.5 %   MCV 98.6  78.0 - 100.0 fL   MCH 32.2  26.0 - 34.0 pg   MCHC 32.6  30.0 - 36.0 g/dL   RDW 40.9 (*) 81.1 - 91.4 %   Platelets 29 (*) 150 - 400 K/uL   Neutrophils Relative % 85 (*) 43 - 77 %   Neutro Abs 12.8 (*) 1.7 - 7.7 K/uL   Lymphocytes Relative 9 (*) 12 - 46 %   Lymphs Abs 1.4  0.7 - 4.0 K/uL   Monocytes Relative 5  3 - 12 %   Monocytes Absolute 0.8  0.1 - 1.0 K/uL   Eosinophils Relative 0  0 - 5 %   Eosinophils Absolute 0.0  0.0 - 0.7 K/uL   Basophils Relative 0  0 - 1 %   Basophils  Absolute 0.0  0.0 - 0.1 K/uL   WBC Morphology INCREASED BANDS (>20% BANDS)     RBC Morphology POLYCHROMASIA PRESENT    COMPREHENSIVE METABOLIC PANEL     Status: Abnormal   Collection Time    08/08/13  5:25 AM      Result Value Range   Sodium 130 (*) 135 - 145 mEq/L   Potassium 3.8  3.5 - 5.1 mEq/L   Chloride 98  96 - 112 mEq/L  CO2 19  19 - 32 mEq/L   Glucose, Bld 80  70 - 99 mg/dL   BUN 7  6 - 23 mg/dL   Creatinine, Ser 2.13  0.50 - 1.35 mg/dL   Calcium 8.1 (*) 8.4 - 10.5 mg/dL   Total Protein 6.2  6.0 - 8.3 g/dL   Albumin 2.9 (*) 3.5 - 5.2 g/dL   AST 25  0 - 37 U/L   ALT 10  0 - 53 U/L   Alkaline Phosphatase 270 (*) 39 - 117 U/L   Total Bilirubin 0.6  0.3 - 1.2 mg/dL   GFR calc non Af Amer >90  >90 mL/min   GFR calc Af Amer >90  >90 mL/min   Dg Chest 2 View  08/06/2013   CLINICAL DATA:  Stroke with left-sided weakness.  EXAM: CHEST  2 VIEW  COMPARISON:  CHEST x-ray 08/06/2013.  FINDINGS: Lung volumes are low. No consolidative airspace disease. No pleural effusions. Mild diffuse peribronchial cuffing and interstitial prominence, similar to prior examinations. Known right upper lobe mass is similar to prior examinations emanating from the perihilar region. Destructive lesion of the lateral aspect of the right 5th rib again noted (the rib is expansile and diffusely lytic with focal soft tissue prominence in this region). Diffuse micronodularity is again noted. Bilateral focal pleuroparenchymal thickening, similar prior examinations, presumably chronic scarring. No acute consolidative airspace disease. No evidence of pulmonary edema. No pleural effusions. Heart size is normal. Upper mediastinal contours are within normal limits.  IMPRESSION: 1. The appearance of the chest is similar to prior examinations demonstrating no definite acute cardiopulmonary disease, but evidence of primary right upper lobe neoplasm with metastatic disease, as above.   Electronically Signed   By: Trudie Reed  M.D.   On: 08/06/2013 18:13   Mr Brain Wo Contrast  08/07/2013   CLINICAL DATA:  72 year old hypertensive patient with history of metastatic lung cancer. Presenting with left-sided weakness. Post IV TPA.  EXAM: MRI HEAD WITHOUT CONTRAST  MRA HEAD WITHOUT CONTRAST  TECHNIQUE: Multiplanar, multiecho pulse sequences of the brain and surrounding structures were obtained without intravenous contrast. Angiographic images of the head were obtained using MRA technique without contrast.  COMPARISON:  08/06/2013 CT.  02/17/2013 MR.  FINDINGS: MRI HEAD FINDINGS  Exam is motion degraded.  Moderate size acute nonhemorrhagic infarct right periopercular region, right subinsular region and right lenticular nucleus and right caudate. Mild local mass effect upon the right lateral ventricle.  Tiny area blood breakdown products left cerebellum probably related to remote hemorrhagic ischemia. No other intracranial hemorrhage noted.  On the diffusion sequence, there are scattered tiny areas of restricted motion in a relatively symmetric fashion involving portions of the hemisphere bilaterally and cerebellum bilaterally. It is possible findings are related to watershed infarcts or embolic disease. Given the patient's history of lung cancer, tiny intracranial metastatic lesions  Global atrophy without hydrocephalus.  Diffuse altered signal intensity of the clivus and upper cervical spine bone marrow. This is new compared to 02/17/2013 exam and raises possibility of diffuse involvement by metastatic disease. Result of anemia is a secondary less likely consideration.  Minimal to mild paranasal sinus mucosal thickening.  MRA HEAD FINDINGS  Mild narrowing super clinoid aspect of the internal carotid artery bilaterally.  Decrease number of visualized right middle cerebral artery branch vessels consistent with patient's acute infarct. Mild to moderate narrowing distal M1 segment right middle cerebral artery.  Moderate narrowing A1 segment  left anterior cerebral artery.  Left middle cerebral  artery branch vessel irregularity.  Mild narrowing distal right vertebral artery.  Non visualized right anterior inferior cerebellar artery.  Narrowed irregular left posterior inferior cerebellar artery and left anterior inferior cerebellar artery.  No high-grade stenosis of the basilar artery.  Mild irregularity and narrowing of portions of the superior cerebellar artery and posterior cerebral artery bilaterally.  Tiny bulge P1 segment right posterior cerebral artery. A vessel appears to arise from this. Tiny aneurysm not entirely excluded. This is at the resolution of the present exam which is slightly motion degraded.  IMPRESSION: MRI HEAD IMPRESSION  Moderate size acute nonhemorrhagic infarct right periopercular region, right subinsular region and right lenticular nucleus and right caudate. Mild local mass effect upon the right lateral ventricle.  On the diffusion sequence, there are scattered tiny areas of restricted motion in a relatively symmetric fashion involving portions of the hemisphere bilaterally and cerebellum bilaterally. It is possible findings are related to watershed infarcts or embolic disease. Given the patient's history of lung cancer, tiny intracranial metastatic lesions cannot be completely excluded. Contrast was administered to evaluate this possibility.  Global atrophy without hydrocephalus.  Diffuse altered signal intensity of the clivus and upper cervical spine bone marrow. This is new compared to 02/17/2013 exam and raises possibility of diffuse involvement by metastatic disease. Result of anemia is a secondary less likely consideration.  Minimal to mild paranasal sinus mucosal thickening.  MRA HEAD IMPRESSION  Decrease number of visualized right middle cerebral artery branch vessels consistent with patient's acute infarct. Mild to moderate narrowing distal M1 segment right middle cerebral artery.  Intracranial atherosclerotic type  changes otherwise as detailed above.  Tiny bulge P1 segment right posterior cerebral artery. A vessel appears to arise from this. Tiny aneurysm not entirely excluded. This is at the resolution of the present exam which is slightly motion degraded.  These results were called by telephone at the time of interpretation on 08/07/2013 at 1:43 PM to Tammy the pateients nurse, who verbally acknowledged these results.   Electronically Signed   By: Bridgett Larsson M.D.   On: 08/07/2013 13:49   Dg Swallowing Func-speech Pathology  08/07/2013   Lacinda Axon, CCC-SLP     08/07/2013 12:52 PM Objective Swallowing Evaluation: Modified Barium Swallowing Study   Patient Details  Name: Nur Krasinski MRN: [id number] Date of Birth: [birth year]  Today's Date: 08/07/2013 Time:10:30 to 11:00   Past Medical History:  Past Medical History  Diagnosis Date  . Cancer   . Hypertension   . Glaucoma   . Shortness of breath     with activity  . Stroke 08/06/2013    tpa given  . Myocardial infarction 08/06/2013   Past Surgical History:  Past Surgical History  Procedure Laterality Date  . Cataract extraction, bilateral Bilateral 2009   HPI:  Gabriel [name] is an 72 y.o. male, right handed, with a past  medical history significant for HTN, MI, metastatic lung cancer,  transferred to [facility] after receiving IV tpa at [facility] ED  earlier today.  MBS indicated following results of BSE.      Assessment / Plan / Recommendation Clinical Impression  Dysphagia Diagnosis: Mild oral phase dysphagia;Moderate  pharyngeal phase dysphagia;Mild cervical esophageal phase  dysphagia Significant amount of aspiration during and after swallow of thin  liquid by spoon and cup.  Sensed aspiration but ineffective cough  to clear aspirated material.  Barium containment in vallecular  space s/p swallow of all consistencies.  Strategy of double  swallow effective in clearing  residue.  Brief esophageal sweep  indicates slow bolus transit with significant amount of   containment in distal esophagus.  Appeared to be narrowing in  esophagus but no radiologist present to confirm.  Recommend to  proceed with dysphagia 2 ( finely chopped) diet consistency with  nectar thick liquids with full supervision with all meals.   Diagnostic treatment completed s/p study focusing on providing  education to patient and caregiver on diet rec's and necessary  swallow strategies to decrease risk for aspiration.  ST to follow  for diet tolerance and possible advancement.  Repeat MBS in 2 to  3 days with clinical improvement prior to advancing liquids if  indicated.      Treatment Recommendation  F/U MBS in ___ days (Comment) (2 to 3 days with clinical  improvement)    Diet Recommendation Dysphagia 2 (Fine chop);Nectar-thick liquid   Liquid Administration via: Cup;No straw Medication Administration: Whole meds with puree Supervision: Patient able to self feed;Full supervision/cueing  for compensatory strategies Compensations: Slow rate;Small sips/bites;Follow solids with  liquid;Multiple dry swallows after each bite/sip Postural Changes and/or Swallow Maneuvers: Seated upright 90  degrees;Upright 30-60 min after meal    Other  Recommendations Recommended Consults: MBS Oral Care Recommendations: Oral care Q4 per protocol Other Recommendations: Order thickener from pharmacy;Prohibited  food (jello, ice cream, thin soups);Remove water pitcher;Have  oral suction available;Clarify dietary restrictions   Follow Up Recommendations  Inpatient Rehab    Frequency and Duration min 2x/week  2 weeks       SLP Swallow Goals     General Date of Onset: 08/06/13 HPI: Gabriel [name] is an 72 y.o. male, right handed, with a past  medical history significant for HTN, MI, metastatic lung cancer,  transferred to [facility] after receiving IV tpa at [facility] ED  earlier today.   Type of Study: Modified Barium Swallowing Study Reason for Referral: Objectively evaluate swallowing function Diet Prior to this Study: NPO  Temperature Spikes Noted: No Respiratory Status: Room air History of Recent Intubation: No Behavior/Cognition: Alert;Cooperative;Pleasant  mood;Distractible;Requires cueing Oral Cavity - Dentition: Missing dentition Oral Motor / Sensory Function: Impaired - see Bedside swallow  eval Self-Feeding Abilities: Able to feed self;Needs assist Patient Positioning: Upright in chair Baseline Vocal Quality: Clear Volitional Cough: Strong Volitional Swallow: Able to elicit Anatomy: Within functional limits Pharyngeal Secretions: Not observed secondary MBS    Reason for Referral Objectively evaluate swallowing function   Oral Phase Oral Preparation/Oral Phase Oral Phase: Impaired Oral - Nectar Oral - Nectar Teaspoon: Weak lingual manipulation;Piecemeal  swallowing;Holding of bolus Oral - Nectar Cup: Holding of bolus;Weak lingual  manipulation;Piecemeal swallowing Oral - Thin Oral - Thin Teaspoon: Weak lingual manipulation;Delayed oral  transit;Piecemeal swallowing Oral - Thin Cup: Holding of bolus;Weak lingual  manipulation;Piecemeal swallowing;Reduced posterior  propulsion;Delayed oral transit Oral - Solids Oral - Puree: Reduced posterior propulsion;Weak lingual  manipulation;Piecemeal swallowing;Delayed oral transit Oral - Mechanical Soft: Delayed oral transit;Piecemeal  swallowing;Reduced posterior propulsion;Weak lingual manipulation   Pharyngeal Phase Pharyngeal Phase Pharyngeal Phase: Impaired Pharyngeal - Nectar Pharyngeal - Nectar Teaspoon: Premature spillage to  valleculae;Reduced anterior laryngeal mobility;Reduced  airway/laryngeal closure;Reduced tongue base  retraction;Pharyngeal residue - valleculae;Reduced pharyngeal  peristalsis Pharyngeal - Nectar Cup: Premature spillage to valleculae;Reduced  anterior laryngeal mobility;Reduced pharyngeal  peristalsis;Reduced laryngeal elevation;Reduced tongue base  retraction;Reduced airway/laryngeal closure;Pharyngeal residue -  valleculae Pharyngeal - Thin Pharyngeal - Thin  Teaspoon: Premature spillage to  valleculae;Reduced anterior laryngeal mobility;Reduced laryngeal  elevation;Reduced pharyngeal peristalsis;Reduced airway/laryngeal  closure;Reduced tongue base retraction;Penetration/Aspiration  before swallow;Penetration/Aspiration during swallow;Significant  aspiration (Amount);Compensatory strategies attempted (Comment)  (chin tuck not effective ) Penetration/Aspiration details (thin teaspoon): Material enters  airway, passes BELOW cords and not ejected out despite cough  attempt by patient Pharyngeal - Thin Cup: Premature spillage to valleculae;Reduced  pharyngeal peristalsis;Reduced anterior laryngeal  mobility;Reduced laryngeal elevation;Reduced tongue base  retraction;Penetration/Aspiration during  swallow;Penetration/Aspiration before swallow;Significant  aspiration (Amount) Penetration/Aspiration details (thin cup): Material enters  airway, passes BELOW cords and not ejected out despite cough  attempt by patient Pharyngeal - Solids Pharyngeal - Puree: Premature spillage to valleculae;Reduced  anterior laryngeal mobility;Reduced pharyngeal  peristalsis;Reduced laryngeal elevation;Reduced tongue base  retraction;Reduced airway/laryngeal closure Pharyngeal - Mechanical Soft: Premature spillage to  valleculae;Reduced pharyngeal peristalsis;Reduced anterior  laryngeal mobility;Reduced laryngeal elevation;Reduced  airway/laryngeal closure;Reduced tongue base  retraction;Pharyngeal residue - valleculae  Cervical Esophageal Phase    GO    Cervical Esophageal Phase Cervical Esophageal Phase: Impaired Cervical Esophageal Phase - Comment Cervical Esophageal Comment: whole barium tablet lodged in distal  esophagus requiring multiple dry swallows to complete transit        Moreen Fowler MS, CCC-SLP [phone] [facility] 08/07/2013, 12:40 PM    Mr Maxine Glenn Head/brain Wo Cm  08/07/2013   CLINICAL DATA:  72 year old hypertensive patient with history of metastatic lung cancer. Presenting with  left-sided weakness. Post IV TPA.  EXAM: MRI HEAD WITHOUT CONTRAST  MRA HEAD WITHOUT CONTRAST  TECHNIQUE: Multiplanar, multiecho pulse sequences of the brain and surrounding structures were obtained without intravenous contrast. Angiographic images of the head were obtained using MRA technique without contrast.  COMPARISON:  08/06/2013 CT.  02/17/2013 MR.  FINDINGS: MRI HEAD FINDINGS  Exam is motion degraded.  Moderate size acute nonhemorrhagic infarct right periopercular region, right subinsular region and right lenticular nucleus and right caudate. Mild local mass effect upon the right lateral ventricle.  Tiny area blood breakdown products left cerebellum probably related to remote hemorrhagic ischemia. No other intracranial hemorrhage noted.  On the diffusion sequence, there are scattered tiny areas of restricted motion in a relatively symmetric fashion involving portions of the hemisphere bilaterally and cerebellum bilaterally. It is possible findings are related to watershed infarcts or embolic disease. Given the patient's history of lung cancer, tiny intracranial metastatic lesions  Global atrophy without hydrocephalus.  Diffuse altered signal intensity of the clivus and upper cervical spine bone marrow. This is new compared to 02/17/2013 exam and raises possibility of diffuse involvement by metastatic disease. Result of anemia is a secondary less likely consideration.  Minimal to mild paranasal sinus mucosal thickening.  MRA HEAD FINDINGS  Mild narrowing super clinoid aspect of the internal carotid artery bilaterally.  Decrease number of visualized right middle cerebral artery branch vessels consistent with patient's acute infarct. Mild to moderate narrowing distal M1 segment right middle cerebral artery.  Moderate narrowing A1 segment left anterior cerebral artery.  Left middle cerebral artery branch vessel irregularity.  Mild narrowing distal right vertebral artery.  Non visualized right anterior inferior  cerebellar artery.  Narrowed irregular left posterior inferior cerebellar artery and left anterior inferior cerebellar artery.  No high-grade stenosis of the basilar artery.  Mild irregularity and narrowing of portions of the superior cerebellar artery and posterior cerebral artery bilaterally.  Tiny bulge P1 segment right posterior cerebral artery. A vessel appears to arise from this. Tiny aneurysm not entirely excluded. This is at the resolution of the present exam which is slightly motion degraded.  IMPRESSION: MRI HEAD IMPRESSION  Moderate size acute nonhemorrhagic infarct right periopercular region, right  subinsular region and right lenticular nucleus and right caudate. Mild local mass effect upon the right lateral ventricle.  On the diffusion sequence, there are scattered tiny areas of restricted motion in a relatively symmetric fashion involving portions of the hemisphere bilaterally and cerebellum bilaterally. It is possible findings are related to watershed infarcts or embolic disease. Given the patient's history of lung cancer, tiny intracranial metastatic lesions cannot be completely excluded. Contrast was administered to evaluate this possibility.  Global atrophy without hydrocephalus.  Diffuse altered signal intensity of the clivus and upper cervical spine bone marrow. This is new compared to 02/17/2013 exam and raises possibility of diffuse involvement by metastatic disease. Result of anemia is a secondary less likely consideration.  Minimal to mild paranasal sinus mucosal thickening.  MRA HEAD IMPRESSION  Decrease number of visualized right middle cerebral artery branch vessels consistent with patient's acute infarct. Mild to moderate narrowing distal M1 segment right middle cerebral artery.  Intracranial atherosclerotic type changes otherwise as detailed above.  Tiny bulge P1 segment right posterior cerebral artery. A vessel appears to arise from this. Tiny aneurysm not entirely excluded. This is at  the resolution of the present exam which is slightly motion degraded.  These results were called by telephone at the time of interpretation on 08/07/2013 at 1:43 PM to Tammy the pateients nurse, who verbally acknowledged these results.   Electronically Signed   By: Bridgett Larsson M.D.   On: 08/07/2013 13:49    Assessment/Plan: Diagnosis: Right brain infarct with left hemiparesis 1. Does the need for close, 24 hr/day medical supervision in concert with the patient's rehab needs make it unreasonable for this patient to be served in a less intensive setting? Yes 2. Co-Morbidities requiring supervision/potential complications: lunc ca, htn 3. Due to bladder management, bowel management, safety, skin/wound care, disease management, medication administration, pain management and patient education, does the patient require 24 hr/day rehab nursing? Yes 4. Does the patient require coordinated care of a physician, rehab nurse, PT (1-2 hrs/day, 5 days/week), OT (1-2 hrs/day, 5 days/week) and SLP (1-2 hrs/day, 5 days/week) to address physical and functional deficits in the context of the above medical diagnosis(es)? Yes Addressing deficits in the following areas: balance, endurance, locomotion, strength, transferring, bowel/bladder control, bathing, dressing, feeding, grooming, toileting, cognition, speech, swallowing and psychosocial support 5. Can the patient actively participate in an intensive therapy program of at least 3 hrs of therapy per day at least 5 days per week? Yes 6. The potential for patient to make measurable gains while on inpatient rehab is excellent 7. Anticipated functional outcomes upon discharge from inpatient rehab are supervision to mod I with PT, supervision to mod I with OT, supervision to mod I with SLP. 8. Estimated rehab length of stay to reach the above functional goals is: 10-14 days 9. Does the patient have adequate social supports to accommodate these discharge functional goals?  Yes 10. Anticipated D/C setting: Home 11. Anticipated post D/C treatments: Outpt therapy 12. Overall Rehab/Functional Prognosis: excellent  RECOMMENDATIONS: This patient's condition is appropriate for continued rehabilitative care in the following setting: CIR Patient has agreed to participate in recommended program. Yes Note that insurance prior authorization may be required for reimbursement for recommended care.  Comment: Pt is motivated and was fairly active PTA. He is an ideal candidate for inpatient rehab. Rehab RN to follow up.   Ranelle Oyster, MD, [place] [facility]     08/08/2013

## 2013-08-08 NOTE — Progress Notes (Signed)
Stroke Team Progress Note  HISTORY  Gabriel [name] is an 72 y.o. male, right handed, with a past medical history significant for HTN, MI, metastatic lung cancer, transferred to [facility] after receiving IV tpa at [facility] ED earlier today.   His wife tells me that he last saw him normal around 8 am today but review of his chart i normal at 8 am today and when she returned home around 1115 he was on the floor completely weak in the left sid. I was informed by the ED physician that his last seen normal was actually around 1130 am today and that his NIHSS was 5.   According to his wife he was also having slurred speech and left face weakness. Never had similar symptoms before.  Denies HA, vertigo, double vision, difficulty swallowing, confusion.  CT brain showed no acute abnormality and thus we decided to treat with IV thrombolysis.   Then, I was called again and was informed that patient had elevated troponin with normal CK-MB as well as normal EKG and that cardiology was already contacted.   Date last known well: 08/06/13  Time last known well: 8 am  tPA Given: yes  NIHSS: 5  MRS: 3   SUBJECTIVE Patient lying in room. No new neurological symptoms. No chest pain. Left sided weakness persists. S/P tPA.Marland Kitchen  OBJECTIVE Most recent Vital Signs: Filed Vitals:   08/08/13 0200 08/08/13 0600 08/08/13 0934 08/08/13 1349  BP: 146/64 147/54 149/74 136/91  Pulse: 87 75 83 88  Temp: 97.6 F (36.4 C) 97.8 F (36.6 C) 97.6 F (36.4 C) 98 F (36.7 C)  TempSrc:   Oral Oral  Resp: 18 18 18 16   Height:      Weight:      SpO2: 100% 100% 100% 100%   CBG (last 3)  No results found for this basename: GLUCAP,  in the last 72 hours  IV Fluid Intake:   . sodium chloride 75 mL/hr at 08/08/13 0544    MEDICATIONS  . aspirin EC  81 mg Oral Daily  . atorvastatin  80 mg Oral q1800  . fentaNYL  100 mcg Transdermal Q72H  . fentaNYL  25 mcg Transdermal Q72H  . pantoprazole  40 mg Oral QHS  . timolol  1  drop Both Eyes Daily   PRN:  acetaminophen, acetaminophen, labetalol, RESOURCE THICKENUP CLEAR, senna-docusate  Diet:  Dysphagia 2 Activity: up with assistance DVT Prophylaxis:  SCD  CLINICALLY SIGNIFICANT STUDIES Basic Metabolic Panel:   Recent Labs Lab 08/08/13 0525  NA 130*  K 3.8  CL 98  CO2 19  GLUCOSE 80  BUN 7  CREATININE 0.56  CALCIUM 8.1*   Liver Function Tests:   Recent Labs Lab 08/08/13 0525  AST 25  ALT 10  ALKPHOS 270*  BILITOT 0.6  PROT 6.2  ALBUMIN 2.9*   CBC:   Recent Labs Lab 08/08/13 0525 08/08/13 1120  WBC 15.0* 16.4*  NEUTROABS 12.8*  --   HGB 9.2* 9.7*  HCT 28.2* 29.4*  MCV 98.6 98.7  PLT 29* 26*   Coagulation: No results found for this basename: LABPROT, INR,  in the last 168 hours Cardiac Enzymes:   Recent Labs Lab 08/06/13 1843 08/07/13 0030 08/07/13 0545  TROPONINI 4.19* 4.16* 2.56*   Urinalysis: No results found for this basename: COLORURINE, APPERANCEUR, LABSPEC, PHURINE, GLUCOSEU, HGBUR, BILIRUBINUR, KETONESUR, PROTEINUR, UROBILINOGEN, NITRITE, LEUKOCYTESUR,  in the last 168 hours Lipid Panel    Component Value Date/Time   CHOL 157 08/07/2013  0545   TRIG 139 08/07/2013 0545   HDL 33* 08/07/2013 0545   CHOLHDL 4.8 08/07/2013 0545   VLDL 28 08/07/2013 0545   LDLCALC 96 08/07/2013 0545   HgbA1C  Lab Results  Component Value Date   HGBA1C 5.5 08/07/2013    Urine Drug Screen:   No results found for this basename: labopia,  cocainscrnur,  labbenz,  amphetmu,  thcu,  labbarb    Alcohol Level: No results found for this basename: ETH,  in the last 168 hours  Dg Chest 2 View 08/06/2013  1. The appearance of the chest is similar to prior examinations demonstrating no definite acute cardiopulmonary disease, but evidence of primary right upper lobe neoplasm with metastatic disease, as above.       Dg Swallowing Func-speech Pathology 08/07/2013   Lacinda Axon, CCC-SLP     08/07/2013 12:52 PM Objective Swallowing  Evaluation: Modified Barium Swallowing Study   Patient Details  Name: Gabriel [name] MRN: [id number] Date of Birth: 1941-09-03  Today's Date: 08/07/2013 Time:10:30 to 11:00   Past Medical History:  Past Medical History  Diagnosis Date  . Cancer   . Hypertension   . Glaucoma   . Shortness of breath     with activity  . Stroke 08/06/2013    tpa given  . Myocardial infarction 08/06/2013   Past Surgical History:  Past Surgical History  Procedure Laterality Date  . Cataract extraction, bilateral Bilateral 2009   HPI:  Gabriel [name] is an 72 y.o. male, right handed, with a past  medical history significant for HTN, MI, metastatic lung cancer,  transferred to [facility] after receiving IV tpa at [facility] ED  earlier today.  MBS indicated following results of BSE.      Assessment / Plan / Recommendation Clinical Impression  Dysphagia Diagnosis: Mild oral phase dysphagia;Moderate  pharyngeal phase dysphagia;Mild cervical esophageal phase  dysphagia Significant amount of aspiration during and after swallow of thin  liquid by spoon and cup.  Sensed aspiration but ineffective cough  to clear aspirated material.  Barium containment in vallecular  space s/p swallow of all consistencies.  Strategy of double  swallow effective in clearing residue.  Brief esophageal sweep  indicates slow bolus transit with significant amount of  containment in distal esophagus.  Appeared to be narrowing in  esophagus but no radiologist present to confirm.  Recommend to  proceed with dysphagia 2 ( finely chopped) diet consistency with  nectar thick liquids with full supervision with all meals.   Diagnostic treatment completed s/p study focusing on providing  education to patient and caregiver on diet rec's and necessary  swallow strategies to decrease risk for aspiration.  ST to follow  for diet tolerance and possible advancement.  Repeat MBS in 2 to  3 days with clinical improvement prior to advancing liquids if  indicated.      Treatment  Recommendation  F/U MBS in ___ days (Comment) (2 to 3 days with clinical  improvement)    Diet Recommendation Dysphagia 2 (Fine chop);Nectar-thick liquid   Liquid Administration via: Cup;No straw Medication Administration: Whole meds with puree Supervision: Patient able to self feed;Full supervision/cueing  for compensatory strategies Compensations: Slow rate;Small sips/bites;Follow solids with  liquid;Multiple dry swallows after each bite/sip Postural Changes and/or Swallow Maneuvers: Seated upright 90  degrees;Upright 30-60 min after meal    Other  Recommendations Recommended Consults: MBS Oral Care Recommendations: Oral care Q4 per protocol Other Recommendations: Order thickener from pharmacy;Prohibited  food (jello, ice cream,  thin soups);Remove water pitcher;Have  oral suction available;Clarify dietary restrictions   Follow Up Recommendations  Inpatient Rehab    Frequency and Duration min 2x/week  2 weeks       SLP Swallow Goals     General Date of Onset: 08/06/13 HPI: Gabriel [name] is an 72 y.o. male, right handed, with a past  medical history significant for HTN, MI, metastatic lung cancer,  transferred to [facility] after receiving IV tpa at [facility] ED  earlier today.   Type of Study: Modified Barium Swallowing Study Reason for Referral: Objectively evaluate swallowing function Diet Prior to this Study: NPO Temperature Spikes Noted: No Respiratory Status: Room air History of Recent Intubation: No Behavior/Cognition: Alert;Cooperative;Pleasant  mood;Distractible;Requires cueing Oral Cavity - Dentition: Missing dentition Oral Motor / Sensory Function: Impaired - see Bedside swallow  eval Self-Feeding Abilities: Able to feed self;Needs assist Patient Positioning: Upright in chair Baseline Vocal Quality: Clear Volitional Cough: Strong Volitional Swallow: Able to elicit Anatomy: Within functional limits Pharyngeal Secretions: Not observed secondary MBS    Reason for Referral Objectively evaluate swallowing  function   Oral Phase Oral Preparation/Oral Phase Oral Phase: Impaired Oral - Nectar Oral - Nectar Teaspoon: Weak lingual manipulation;Piecemeal  swallowing;Holding of bolus Oral - Nectar Cup: Holding of bolus;Weak lingual  manipulation;Piecemeal swallowing Oral - Thin Oral - Thin Teaspoon: Weak lingual manipulation;Delayed oral  transit;Piecemeal swallowing Oral - Thin Cup: Holding of bolus;Weak lingual  manipulation;Piecemeal swallowing;Reduced posterior  propulsion;Delayed oral transit Oral - Solids Oral - Puree: Reduced posterior propulsion;Weak lingual  manipulation;Piecemeal swallowing;Delayed oral transit Oral - Mechanical Soft: Delayed oral transit;Piecemeal  swallowing;Reduced posterior propulsion;Weak lingual manipulation   Pharyngeal Phase Pharyngeal Phase Pharyngeal Phase: Impaired Pharyngeal - Nectar Pharyngeal - Nectar Teaspoon: Premature spillage to  valleculae;Reduced anterior laryngeal mobility;Reduced  airway/laryngeal closure;Reduced tongue base  retraction;Pharyngeal residue - valleculae;Reduced pharyngeal  peristalsis Pharyngeal - Nectar Cup: Premature spillage to valleculae;Reduced  anterior laryngeal mobility;Reduced pharyngeal  peristalsis;Reduced laryngeal elevation;Reduced tongue base  retraction;Reduced airway/laryngeal closure;Pharyngeal residue -  valleculae Pharyngeal - Thin Pharyngeal - Thin Teaspoon: Premature spillage to  valleculae;Reduced anterior laryngeal mobility;Reduced laryngeal  elevation;Reduced pharyngeal peristalsis;Reduced airway/laryngeal  closure;Reduced tongue base retraction;Penetration/Aspiration  before swallow;Penetration/Aspiration during swallow;Significant  aspiration (Amount);Compensatory strategies attempted (Comment)  (chin tuck not effective ) Penetration/Aspiration details (thin teaspoon): Material enters  airway, passes BELOW cords and not ejected out despite cough  attempt by patient Pharyngeal - Thin Cup: Premature spillage to valleculae;Reduced   pharyngeal peristalsis;Reduced anterior laryngeal  mobility;Reduced laryngeal elevation;Reduced tongue base  retraction;Penetration/Aspiration during  swallow;Penetration/Aspiration before swallow;Significant  aspiration (Amount) Penetration/Aspiration details (thin cup): Material enters  airway, passes BELOW cords and not ejected out despite cough  attempt by patient Pharyngeal - Solids Pharyngeal - Puree: Premature spillage to valleculae;Reduced  anterior laryngeal mobility;Reduced pharyngeal  peristalsis;Reduced laryngeal elevation;Reduced tongue base  retraction;Reduced airway/laryngeal closure Pharyngeal - Mechanical Soft: Premature spillage to  valleculae;Reduced pharyngeal peristalsis;Reduced anterior  laryngeal mobility;Reduced laryngeal elevation;Reduced  airway/laryngeal closure;Reduced tongue base  retraction;Pharyngeal residue - valleculae  Cervical Esophageal Phase    GO    Cervical Esophageal Phase Cervical Esophageal Phase: Impaired Cervical Esophageal Phase - Comment Cervical Esophageal Comment: whole barium tablet lodged in distal  esophagus requiring multiple dry swallows to complete transit        Gabriel Fowler MS, CCC-SLP [phone] [facility] 08/07/2013, 12:40 PM    Gabriel [name] 08/07/2013  Decrease number of visualized right middle cerebral artery branch vessels consistent with patient's acute infarct. Mild to moderate narrowing distal M1  segment right middle cerebral artery.  Intracranial atherosclerotic type changes otherwise as detailed above.  Tiny bulge P1 segment right posterior cerebral artery. A vessel appears to arise from this. Tiny aneurysm not entirely excluded. This is at the resolution of the present exam which is slightly motion degraded.      CT of the brain  Done at [facility], reported as negative  MRI of the brain  Moderate size acute nonhemorrhagic infarct right periopercular region, right subinsular region and right lenticular nucleus and right  caudate. Mild local mass effect upon the right lateral ventricle. On the diffusion sequence, there are scattered tiny areas of restricted motion in a relatively symmetric fashion involving portions of the hemisphere bilaterally and cerebellum bilaterally. It is possible findings are related to watershed infarcts or embolic disease. Given the patient's history of lung cancer, tiny intracranial metastatic lesions cannot be completely excluded. Contrast was administered to evaluate this possibility. Global atrophy without hydrocephalus. Diffuse altered signal intensity of the clivus and upper cervical  spine bone marrow. This is new compared to 02/17/2013 exam and raises possibility of diffuse involvement by metastatic disease.  Result of anemia is a secondary less likely consideration.  Minimal to mild paranasal sinus mucosal thickening.   MRA of the brain  Decrease number of visualized right middle cerebral artery branch vessels consistent with patient's acute infarct. Mild to moderate narrowing distal M1 segment right middle cerebral artery. Tiny bulge P1 segment right posterior cerebral artery. A vessel appears to arise from this. Tiny aneurysm not entirely excluded.   2D Echocardiogram    Carotid Doppler  Findings suggest 1-39% right internal carotid artery stenosis and elevated peak systolic velocities of the left proximal internal carotid artery suggestive of 40-59% stenosis, however end diastolic velocities and ICA/CCA ratio suggest upper range 1-39% stenosis. Vertebral arteries are patent with antegrade flow.   CXR   IMPRESSION:  1. The appearance of the chest is similar to prior examinations  demonstrating no definite acute cardiopulmonary disease, but  evidence of primary right upper lobe neoplasm with metastatic  disease, as above.  EKG     Therapy Recommendations    Physical Exam  General: The patient is alert and cooperative at the time of the examination.  Respiratory: lung  fields are clear  Cardiovascular: There is a regular rhythm, no murmurs  Skin: No significant peripheral edema is noted.   Neurologic Exam   Awake alert oriented x 3. Follows commands well  Cranial nerves:   Extraocular movements are full.  The patient has a left HH.Speech is dysarthric.  Motor: The patient has good strength in the right extremities. There is 4/5 strength in the left arm, and 4+/5 strength of the left leg.  Sensory: soft touch is symmetric on all fours and face.  Coordination: The patient has good finger-nose-finger and heel-to-shin on the right,  Dysmetria of the left arm, normal left leg.  Gait and station: The patient was not ambulated, at bed rest.  Reflexes: Deep tendon reflexes are symmetric.    ASSESSMENT Gabriel. Dandrae Kustra is a 72 y.o. male presenting with right brain stroke. Not on antiplatelet medications at home. The patient received TPA at 14:00 yesterday. Follow up MRI showed no hemorrhage; however an acute nonhemorrhagic infarct right periopercular region, right subinsular region and right lenticular nucleus and right caudate. Also, the patient has known metastatic lung cancer. The patient also had chest pain and suffered myocardial infarction during this event. Work up underway  -Right brain CVA -  Metastatic lung cancer -non-STEMI -Hypertension -Urinary retention -thrombocytopenia, 29K -lipids, 96, on statin -HGB A1C    5.5   Hospital day # 2  TREATMENT/PLAN  Risk factor modification  Cardiac recommendations per consultants  Repeat platelet levels to confirm that this level is true; as in past his platelet levels have been normal-----confirmed. Will need to hold aspirin and lovenox due to low platelets  Restart pain medications that the patient requires for cancer  Daily CBC  Gwendolyn Lima. Manson Passey, [facility], MBA, MHA [facility] Pager: [phone] 08/08/2013 2:48 PM  I have personally obtained a history, examined the patient,  evaluated imaging results, and formulated the assessment and plan of care. I agree with the above.

## 2013-08-08 NOTE — Evaluation (Addendum)
Speech Language Pathology Evaluation Patient Details Name: Gabriel [name] MRN: [id number] DOB: 1940-11-17 Today's Date: 08/08/2013 Time: 0347-4259 SLP Time Calculation (min): 19 min  Problem List:  Patient Active Problem List   Diagnosis Date Noted  . Stroke 08/07/2013  . NSTEMI (non-ST elevated myocardial infarction) 08/06/2013  . Hypertension 02/09/2013  . Pain of left leg 02/09/2013  . Lung mass 02/09/2013  . Soft tissue mass 02/09/2013  . 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 Cancer   . Hypertension   . Glaucoma   . Shortness of breath     with activity  . Stroke 08/06/2013    tpa given  . NSTEMI (non-ST elevated myocardial infarction) 08/06/2013   Past Surgical History:  Past Surgical History  Procedure Laterality Date  . Cataract extraction, bilateral Bilateral 2009   HPI:  Gabriel [name] is an 72 y.o. male, right handed, with a past medical history significant for HTN, MI, metastatic lung cancer, transferred to [facility] after receiving IV tpa at [facility] on 08/06/13.  Pt reports living with his new spouse (married x 3 weeks) and managing outisde home duties including mowing, taking care of yard, etc.  He denies h/o speech deficits and reports working for a [employer] until a few years ago.  Pt has a 10th grade education and has one son (son he states is 25 years old).  MRI head Moderate size acute nonhemorrhagic infarct right periopercular region, right subinsular region and right lenticular nucleus and  right caudate. Mild local mass effect upon the right lateral ventricle.  Question metastatic lesions given h/o lung cancer.  Pt for speech and language evaluation per MD order.       Assessment / Plan / Recommendation Clinical Impression  Pt presents with moderately severe cognitive linguistic deficits, dysphagia, dysarthria and CN deficits (including facial LMN).  Mild dysarthria apparent with  imprecise articulation at phrase, multisyllabic level for which pt is partially compensating.  Pt's cognitive deficits present as decreased awareness to deficits, judgement, memory and problem solving- also ? Some left inattention (*required verbal/visual cues to look at let side of meal plate and clock on wall on left).    Pt states he wears glasses- per chart has h/o glaucoma.    He was oriented (except reading of time was incorrect on clock and pt states stroke vs MI per pt).   He needed cues to use call bell to summon assist even after RN had reviewed call bell use this am.  Pt would benefit from skilled SLP to maximize cognitive linguistic function, swallow and speech function to mitigate caregiver burden.    Pt would benefit from 24/7 supervision at dc due to decreased safety awareness.  Pt states his wife manages home cooking, cleaning and is able to manage finances *although he reports doing home finances prior to CVA.  Skilled intervention included providing pt with dysarthria compensation strategies using teach back and establishment of goals.  Pt is agreeable to plan for rehab.        SLP Assessment  Patient needs continued Speech Lanaguage Pathology Services    Follow Up Recommendations  Inpatient Rehab (if 24/7 supervision able to be provided)    Frequency and Duration min 2x/week      Pertinent Vitals/Pain Afebrile, decreased   SLP Goals   see care plan  SLP Evaluation Prior Functioning  Cognitive/Linguistic Baseline: Information not available (pt denies baseline deficits- no family present)  Lives With: Spouse Vocation: Retired   IT consultant  Overall Cognitive Status: Impaired/Different from baseline Arousal/Alertness: Awake/alert Orientation Level: Oriented to person;Oriented to place;Oriented to time;Oriented to situation Attention: Sustained Sustained Attention: Impaired Sustained Attention Impairment: Functional basic Memory: Impaired Memory Impairment: Storage  deficit;Retrieval deficit;Decreased recall of new information Awareness: Impaired Awareness Impairment: Intellectual impairment Problem Solving: Impaired Problem Solving Impairment: Functional basic;Verbal basic (cues to need to call for assist, concern for awareness to deficits) Behaviors: Restless Safety/Judgment: Impaired Comments: pt states he has the ability to go home and mow today but said he it didn't mean that he would do it, poor awareness to signifcant deficits impacting his participation in rehabiliation    Comprehension  Auditory Comprehension Overall Auditory Comprehension: Impaired Yes/No Questions: Not tested Commands: Impaired Two Step Basic Commands: 75-100% accurate (wrong order, correct direction following) Multistep Basic Commands: 75-100% accurate (3 step) Conversation: Complex Interfering Components: Attention;Working Radio broadcast assistant: Dietitian: Within [employer] Reading Comprehension Reading Status: Within funtional limits (for reading calendar in room, difficulty reading clock) - needed cues to attend to clock   Expression Expression Primary Mode of Expression: Verbal Verbal Expression Overall Verbal Expression: Appears within functional limits for tasks assessed Initiation: No impairment Level of Generative/Spontaneous Verbalization: Conversation Repetition: No impairment Naming: No impairment Pragmatics: No impairment Non-Verbal Means of Communication: Not applicable Written Expression Dominant Hand: Right Written Expression:  (DNT)   Oral / Motor Oral Motor/Sensory Function Overall Oral Motor/Sensory Function: Impaired Labial ROM: Reduced left Labial Symmetry: Abnormal symmetry left Labial Strength: Reduced Labial Sensation: Reduced Lingual ROM: Reduced left Lingual Symmetry: Abnormal symmetry left Lingual Strength: Reduced Lingual Sensation:  Reduced Facial ROM: Reduced left Facial Symmetry: Left droop Facial Strength: Reduced Facial Sensation: Reduced Velum: Within Functional Limits Mandible: Within Functional Limits Motor Speech Overall Motor Speech: Impaired Respiration: Impaired Level of Impairment: Conversation Phonation: Normal Resonance: Within functional limits Articulation: Impaired Level of Impairment: Sentence Intelligibility: Intelligibility reduced Word: 75-100% accurate Phrase: 75-100% accurate Sentence: 50-74% accurate Conversation: 50-74% accurate Motor Planning: Witnin functional limits Motor Speech Errors: Not applicable Effective Techniques: Slow rate;Over-articulate   GO     Donavan Burnet, MS [facility] SLP [phone]

## 2013-08-08 NOTE — Evaluation (Signed)
Occupational Therapy Evaluation Patient Details Name: Gabriel [name] MRN: [id number] DOB: 1941-03-27 Today's Date: 08/08/2013 Time: 4098-1191 OT Time Calculation (min): 22 min  OT Assessment / Plan / Recommendation History of present illness 72 yo male admitted with Lt side weakness and elevated troponin levels. Pt now dx with Nstemi, s/p TPA, Rt CVA   Clinical Impression   PT admitted with Rt CVA. Pt currently with functional limitiations due to the deficits listed below (see OT problem list).  Pt will benefit from skilled OT to increase their independence and safety with adls and balance to allow discharge CIR.     OT Assessment  Patient needs continued OT Services    Follow Up Recommendations  CIR    Barriers to Discharge      Equipment Recommendations  Other (comment) (tba defer to CIR at this time)    Recommendations for Other Services Rehab consult  Frequency  Min 2X/week    Precautions / Restrictions Precautions Precautions: Fall   Pertinent Vitals/Pain 120 HR with static standing    ADL  Eating/Feeding: Other (comment) (difficulty controlling secretions) Grooming: Wash/dry face;Min guard Where Assessed - Grooming: Supported sitting Toilet Transfer: Maximal assistance Statistician Method: Sit to Barista: Raised toilet seat with arms (or 3-in-1 over toilet) Toileting - Clothing Manipulation and Hygiene: +1 Total assistance Where Assessed - Toileting Clothing Manipulation and Hygiene: Sit to stand from 3-in-1 or toilet Equipment Used: Gait belt;Rolling walker Transfers/Ambulation Related to ADLs: pt with strong left lean and unaware of lean to the left. Pt unsafe to ambulate one person (A) at this time due to implusive.  ADL Comments: Pt able to answer questions about PTA housing without deficits. Wife present to confirm. Wife expressed desire to get him the best care possible and she wants him here for rehab. Wife would like [facility] if  placement is required. pt with decr control of oral secretions and secretions leaking from left side of mouth. Wife asking "when will this come back? Our preacher was much faster than this." Pt and wife educated that some of the recovery process has to do with the brain healing and that everyone heals different. Currently there is no set time frame if or when it will come back. Right now patient and wife must wait to see how much spontaneous recovery the body is able to do on its own and in the time here on acute we will work with him on return to PTA level of mobility/ adls. Pt's wife attempting to give thicken liquids at bed side. Pt with coughing s/p apple sauce fed by wife. Pt coughing with bed not fully elevated due to oral secretions.     OT Diagnosis: Generalized weakness;Cognitive deficits;Hemiplegia non-dominant side  OT Problem List: Decreased strength;Decreased range of motion;Decreased activity tolerance;Impaired balance (sitting and/or standing);Decreased coordination;Decreased cognition;Decreased safety awareness;Decreased knowledge of use of DME or AE;Decreased knowledge of precautions;Impaired UE functional use OT Treatment Interventions: Self-care/ADL training;Therapeutic exercise;Neuromuscular education;DME and/or AE instruction;Therapeutic activities;Cognitive remediation/compensation;Patient/family education;Balance training   OT Goals(Current goals can be found in the care plan section) Acute Rehab OT Goals Patient Stated Goal: to get back to walking and moving OT Goal Formulation: With patient/family Time For Goal Achievement: 08/22/13 Potential to Achieve Goals: Good ADL Goals Pt Will Perform Grooming: with min assist;sitting Pt Will Perform Upper Body Bathing: with min assist;sitting Pt Will Perform Upper Body Dressing: with min assist;sitting Pt Will Transfer to Toilet: with min assist;bedside commode Additional ADL Goal #1: Pt will  demonstrate reaching with LT UE to obtain  3 out 4 objects at greater than 5 inches outside base of support  Visit Information  Last OT Received On: 08/08/13 Assistance Needed: +2 History of Present Illness: 72 yo male admitted with Lt side weakness and elevated troponin levels. Pt now dx with Nstemi, s/p TPA, Rt CVA       Prior Functioning     Home Living Family/patient expects to be discharged to:: Inpatient rehab Living Arrangements: Spouse/significant other Available Help at Discharge: Family Type of Home: House Home Access: Stairs to enter [employer] of Steps: 1 Home Layout: One level Home Equipment: Walker - 4 wheels;Cane - single point Additional Comments: pt using mother in laws old rollator   Lives With: Spouse Prior Function Level of Independence: Independent with assistive device(s) Comments: Reports uses a RW and/or cane for amb Communication Communication: Other (comment);Expressive difficulties (slurred speech) Dominant Hand: Right         Vision/Perception Vision - History Baseline Vision: Wears glasses all the time Patient Visual Report: No change from baseline Vision - Assessment Vision Assessment: Vision not tested   Cognition  Cognition Arousal/Alertness: Awake/alert Behavior During Therapy: WFL for tasks assessed/performed Overall Cognitive Status: Impaired/Different from baseline Area of Impairment: Safety/judgement;Awareness;Problem solving Memory: Decreased short-term memory Safety/Judgement: Decreased awareness of deficits;Decreased awareness of safety Awareness: Anticipatory Problem Solving: Slow processing;Difficulty sequencing General Comments: pt impulsive and needs cues to remain at bedside. Pt's wife repeating instructions for safety    Extremity/Trunk Assessment Upper Extremity Assessment Upper Extremity Assessment: LUE deficits/detail LUE Deficits / Details: shoulder flexion 3+ out 5, decr motor control/ planning LUE Coordination: decreased fine motor Lower  Extremity Assessment Lower Extremity Assessment: Defer to PT evaluation LLE Deficits / Details: Noteable weakness; 3/5 hip flexors, 3/5 quad LLE Coordination: decreased gross motor Cervical / Trunk Assessment Cervical / Trunk Assessment: Normal     Mobility Bed Mobility Bed Mobility: Supine to Sit;Sitting - Scoot to [place] of Bed;Sit to Supine;Rolling Right;Right Sidelying to Sit Rolling Right: 2: Max assist Right Sidelying to Sit: 2: Max assist;HOB elevated;With rails Supine to Sit: 2: Max assist;HOB elevated;With rails Sitting - Scoot to Edge of Bed: 2: Max assist Sit to Supine: 3: Mod assist;HOB flat Details for Bed Mobility Assistance: pt attempting to exit bed on left side when cues where to come to the right side. Pt's wife reinforcing instructions "she wants you to get out on the right side honey,. Go toward here. Listen to what she is saying" pt with decr problem solving. Transfers Transfers: Sit to Stand;Stand to Sit Sit to Stand: 3: Mod assist;With upper extremity assist;From bed Stand to Sit: 3: Mod assist;With upper extremity assist;To bed Details for Transfer Assistance: Pt with uncontrolled descned to bed. pt pushing with Rt UE on RW in full elbow extension and decr grasp with LT Ue. pt demonstrates 3 out 5 grasp but decr grasp on RW. Question tactile input (sensation) vs attention     Exercise     Balance Balance Balance Assessed: Yes Static Sitting Balance Static Sitting - Balance Support: Feet supported;No upper extremity supported Static Sitting - Level of Assistance: 3: Mod assist (leaning to the left) Static Sitting - Comment/# of Minutes: Up to mod assist to achieve midline orientation in static sitting; significant Left lean; Noted tendency for LUE neglect   End of Session OT - End of Session Activity Tolerance: Patient tolerated treatment well Patient left: in bed;with call bell/phone within reach;with family/visitor present Nurse Communication: Mobility  status;Precautions  GO     Harolyn Rutherford 08/08/2013, 1:55 PM  Pager: [phone]

## 2013-08-08 NOTE — Progress Notes (Signed)
       Patient Name: Gabriel [name]      SUBJECTIVE: admitted with CVA>>tPA and +NSTEMI  No chest pain Echo pending C/o back pain  Past Medical History  Diagnosis Date  . Cancer   . Hypertension   . Glaucoma   . Shortness of breath     with activity  . Stroke 08/06/2013    tpa given  . NSTEMI (non-ST elevated myocardial infarction) 08/06/2013    Scheduled Meds:  Scheduled Meds: . aspirin EC  81 mg Oral Daily  . pantoprazole (PROTONIX) IV  40 mg Intravenous QHS  . timolol  1 drop Both Eyes Daily   Continuous Infusions: . sodium chloride 75 mL/hr at 08/08/13 0544    PHYSICAL EXAM Filed Vitals:   08/07/13 1915 08/07/13 2200 08/08/13 0200 08/08/13 0600  BP: 150/74 158/75 146/64 147/54  Pulse: 79 78 87 75  Temp: 97.7 F (36.5 C) 98 F (36.7 C) 97.6 F (36.4 C) 97.8 F (36.6 C)  TempSrc:      Resp: 18 18 18 18   Height:      Weight:      SpO2: 100% 99% 100% 100%   Well developed and nourished in no acute distress HENTfacial droop Neck supple   Clear Regular rate and rhythm, no murmurs or gallops Abd-soft with active BS No Clubbing cyanosis edema Skin-warm and dry A & Oriented    TELEMETRY: Reviewed telemetry pt in NSR    Intake/Output Summary (Last 24 hours) at 08/08/13 0841 Last data filed at 08/07/13 1800  Gross per 24 hour  Intake 701.25 ml  Output    970 ml  Net -268.75 ml    LABS: Basic Metabolic Panel:  Recent Labs Lab 08/08/13 0525  NA 130*  K 3.8  CL 98  CO2 19  GLUCOSE 80  BUN 7  CREATININE 0.56  CALCIUM 8.1*   Cardiac Enzymes:  Recent Labs  08/06/13 1843 08/07/13 0030 08/07/13 0545  TROPONINI 4.19* 4.16* 2.56*   CBC:  Recent Labs Lab 08/08/13 0525  WBC 15.0*  NEUTROABS 12.8*  HGB 9.2*  HCT 28.2*  MCV 98.6  PLT 29*   PROTIME: No results found for this basename: LABPROT, INR,  in the last 72 hours Liver Function Tests:  Recent Labs  08/08/13 0525  AST 25  ALT 10  ALKPHOS 270*  BILITOT 0.6  PROT  6.2  ALBUMIN 2.9*    Recent Labs  08/07/13 0545  HGBA1C 5.5   Fasting Lipid Panel:  Recent Labs  08/07/13 0545  CHOL 157  HDL 33*  LDLCALC 96  TRIG 161  CHOLHDL 4.8  *   ASSESSMENT AND PLAN:  Active Problems:   NSTEMI (non-ST elevated myocardial infarction)   Stroke  Await echo Will repeat ecg Add low dose BB if ok with neurology  BP>150 Add statin*(sprarcl trial)  Signed, Sherryl Manges MD  08/08/2013

## 2013-08-09 ENCOUNTER — Other Ambulatory Visit: Payer: Self-pay | Admitting: Oncology

## 2013-08-09 DIAGNOSIS — C7970 Secondary malignant neoplasm of unspecified adrenal gland: Secondary | ICD-10-CM

## 2013-08-09 DIAGNOSIS — I214 Non-ST elevation (NSTEMI) myocardial infarction: Secondary | ICD-10-CM

## 2013-08-09 DIAGNOSIS — D6959 Other secondary thrombocytopenia: Secondary | ICD-10-CM

## 2013-08-09 DIAGNOSIS — C3410 Malignant neoplasm of upper lobe, unspecified bronchus or lung: Secondary | ICD-10-CM

## 2013-08-09 LAB — DIC (DISSEMINATED INTRAVASCULAR COAGULATION) PANEL
Fibrinogen: 429 mg/dL (ref 204–475)
Prothrombin Time: 13.7 seconds (ref 11.6–15.2)
aPTT: 32 seconds (ref 24–37)

## 2013-08-09 LAB — DIC (DISSEMINATED INTRAVASCULAR COAGULATION)PANEL
Platelets: 23 10*3/uL — CL (ref 150–400)
Smear Review: NONE SEEN

## 2013-08-09 LAB — CBC
HCT: 27.6 % — ABNORMAL LOW (ref 39.0–52.0)
Hemoglobin: 9.1 g/dL — ABNORMAL LOW (ref 13.0–17.0)
Platelets: 24 10*3/uL — CL (ref 150–400)
RBC: 2.79 MIL/uL — ABNORMAL LOW (ref 4.22–5.81)
WBC: 12.7 10*3/uL — ABNORMAL HIGH (ref 4.0–10.5)

## 2013-08-09 MED ORDER — METOPROLOL TARTRATE 12.5 MG HALF TABLET
12.5000 mg | ORAL_TABLET | Freq: Two times a day (BID) | ORAL | Status: DC
  Administered 2013-08-09 – 2013-08-10 (×3): 12.5 mg via ORAL
  Filled 2013-08-09 (×5): qty 1

## 2013-08-09 NOTE — Consult Note (Addendum)
[facility]  [phone]   ONCOLOGY  HOSPITAL CONSULTATION NOTE  Gabriel [name]                                MR#: [id number]  DOB: 07-24-1941                       CSN#: [id number]  Referring MD: [facility]  Primary MD: Dr.  Jaquita Rector for Consult: Thrombocytopenia   NWG:NFAOZ Gabriel [name] is a 72 y.o. [place], [place] male we are asked to see for evaluation of thrombocytopenia. Patient has a well detailed history of adenocarcinoma of the lung initially seen in consultation by this writer on 02/10/13, cosigned by Dr. Clelia Croft. At the time he had presented with left lower extremity numbness and back pain. Work up included CT chest/abdomen and pelvis and it revealed right upper lung lobe mass 3.2 x 2.7 x 2.8 cm; soft tissue mass in the area of right 5 th rib 7.4 x 3.1 x 5.4 cm, lytic lesion T5 area, right adrenal mass suspicious for metastatic disease. In addition there is a partial fragmentation of S1, with mass extending along the left sacral ala which appears to cause mass effect on the left-sided exiting nerve root at S1-S2. 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 Upon discharge on 02/13/2013, he received his1st dose of radiation on Monday 02/14/2013 total of 15 fractions in [place], [place] (as this is the patient's prreferred location). In addition, per patient report, he received 3 cycles of chemotherapy, of a total of 4 cycles ( after right Port-A-Cath placement in August of 2014), last given on Thursday, 08/04/2013, with Neulasta injection on day 2, 08/05/2013.He is unable to report the oncologist name, we will proceed with further research.  He was transferred from [facility] on 10/12 after presenting on 08/06/13 with Left sided weakness, slurred speech and L facial droop. Troponins were elevated at 4.19 with diagnosis on NSTEMI followed by Cardiology. He also had L ICA stenosis at around 50 %. CT brain showed no acute abnormality  and thus IV thrombolysis was initiated. He received tPA at 14:00 hrs.Aspirin therapy was started at 81 mg after  MRI brain on 10/12 showed moderate-sized acute nonhemorrhagic infarct right periopercular region, right subinsular region as well as lenticular nucleus and cauda.    While Platelets on discharge on 02/13/2013  were normal, ranging from 188-220k, admission platelet count on 10/13 was 29k with repeat 26k . On 08/09/13 his platelets are 24k. Of note his H/H  Has been ranging on the 9's-today 9.1/28s, today at 27.6. WBC are beginning to normalize following Neulasta, initially 15, today 12.7 (he was on steroids). Smear ordered for review. DIC panel  and HIT are pending.   No family history of hematological disorders. No gum bleed. No epistaxis or hemoptysis. Denies easy bruising.  No ASA but on Motrin at 400 mg every 8 hrs as needed. He had never hematological evaluation prior to this admission or bone marrow biopsy. He is on SCDs for DVT prophylaxis.  As for his history of lung cancer, his latest chest x-ray on admission  is similar to prior examinations demonstrating no definite acute cardiopulmonary disease, but evidence of primary right upper lobe neoplasm with metastatic disease.latest CTs are not available for review.  PMH:  Past Medical History  Diagnosis Date  . Adenocarcinoma of the lung, as above  April 2014  . Hypertension   . Glaucoma   . Shortness of breath     with activity  . Stroke 08/06/2013    tpa given  . NSTEMI (non-ST elevated myocardial infarction) 08/06/2013    Surgeries:  Past Surgical History  Procedure Laterality Date  . Cataract extraction, bilateral Bilateral 2009    Allergies:  Allergies  Allergen Reactions  . Hydrocodone Rash    Medications:   . atorvastatin  80 mg Oral q1800  . fentaNYL  25 mcg Transdermal Q72H  . loratadine  10 mg Oral Daily  . metoprolol tartrate  12.5 mg Oral BID  . pantoprazole  40 mg Oral QHS  . timolol  1 drop Both  Eyes Daily    FAO:ZHYQMVHQIONGE, acetaminophen, labetalol, RESOURCE THICKENUP CLEAR, senna-docusate  ROS: Constitutional:   For 10-15 weight loss since 12/2012. Negative for    fever, chills or  night sweats.  Positive for fatigue.  Eyes: he has glaucoma Respiratory: Negative for cough. No hemoptysis. Positive for  shortness of breath, chronic. No pleuritic chest pain.  Cardiovascular: Negative for chest pain. No palpitations.  GI: Negative for  nausea, vomiting, diarrhea or constipation. No change in bowel caliber. No  Melena or hematochezia. No abdominal pain.  GU: Negative for hematuria. No loss of urinary control. Had trouble voiding this morning, urinary catheter has been placed.. Skin: Negative for itching. No rash. No petechia. No easy bruising. No gum bleed.  Musculoskeletal: positive for chronic back pain Neurological: No headaches. Motor deficiencies slightly improving on his . No sensory deficits at this time. Mild dysphagia (stage 2)  Family History:    Family History  Problem Relation Age of Onset  . Heart failure Mother   . Stroke Father   . Heart disease Brother     No family history of bleeding disorders.  Social History:  reports that he has quit smoking. He does not have any smokeless tobacco history on file. He reports that he does not drink alcohol or use illicit drugs. Married to wife Hilda Lias [phone].  One grown son.  Retired Curator. Lived in [place] all his life.No POA/No Living Will. Full Code  Physical Exam    Filed Vitals:   08/09/13 0600  BP: 144/61  Pulse: 81  Temp: 97.5 F (36.4 C)  Resp: 18     Filed Weights   08/06/13 1549 08/07/13 0500  Weight: 141 lb 12.1 oz (64.3 kg) 145 lb 4.5 oz (65.9 kg)    General:  14 -year-old white in no acute distress A. and O to person, not to place, time.   well-developed and ill-appearing. HEENT:Left facial droop, atraumatic, PERRLA. Oral cavity without thrush or lesions. No gingival bleeding. Neck supple. no  thyromegaly, no cervical or supraclavicular adenopathy  Lungs clear bilaterally . No wheezing, rhonchi or rales. No axillary masses. Right Port A Cath in place, non tender.  Breasts: not examined. Cardiac regular rate and rhythm,no murmur , rubs or gallops Abdomen soft nontender , bowel sounds x4. No HSM. No masses palpable.  GU/rectal: deferred. Extremities no clubbing cyanosis or edema. No bruising or petechial rash Musculoskeletal: no spinal tenderness.  Neuro: Intact cognition.follows simple commands. Dysarthria noted. L facial droop. L sided weakness at 4/5. R side normal.   Labs:     Recent Labs Lab 08/08/13 0525 08/08/13 1120 08/09/13 0520  WBC 15.0* 16.4* 12.7*  HGB 9.2* 9.7* 9.1*  HCT 28.2* 29.4* 27.6*  PLT 29* 26* 24*  MCV 98.6 98.7 98.9  MCH 32.2 32.6 32.6  MCHC 32.6 33.0 33.0  RDW 18.0* 18.0* 18.3*  LYMPHSABS 1.4  --   --   MONOABS 0.8  --   --   EOSABS 0.0  --   --   BASOSABS 0.0  --   --        Recent Labs Lab 08/08/13 0525  NA 130*  K 3.8  CL 98  CO2 19  GLUCOSE 80  BUN 7  CREATININE 0.56  CALCIUM 8.1*  AST 25  ALT 10  ALKPHOS 270*  BILITOT 0.6        Component Value Date/Time   BILITOT 0.6 08/08/2013 0525       Imaging Studies:  Dg Chest 2 View  08/06/2013   CLINICAL DATA:  Stroke with left-sided weakness.  EXAM: CHEST  2 VIEW  COMPARISON:  CHEST x-ray 08/06/2013.  FINDINGS: Lung volumes are low. No consolidative airspace disease. No pleural effusions. Mild diffuse peribronchial cuffing and interstitial prominence, similar to prior examinations. Known right upper lobe mass is similar to prior examinations emanating from the perihilar region. Destructive lesion of the lateral aspect of the right 5th rib again noted (the rib is expansile and diffusely lytic with focal soft tissue prominence in this region). Diffuse micronodularity is again noted. Bilateral focal pleuroparenchymal thickening, similar prior examinations, presumably chronic  scarring. No acute consolidative airspace disease. No evidence of pulmonary edema. No pleural effusions. Heart size is normal. Upper mediastinal contours are within normal limits.  IMPRESSION: 1. The appearance of the chest is similar to prior examinations demonstrating no definite acute cardiopulmonary disease, but evidence of primary right upper lobe neoplasm with metastatic disease, as above.   Electronically Signed   By: Trudie Reed M.D.   On: 08/06/2013 18:13   Mr Brain Wo Contrast  08/07/2013   CLINICAL DATA:  72 year old hypertensive patient with history of metastatic lung cancer. Presenting with left-sided weakness. Post IV TPA.  EXAM: MRI HEAD WITHOUT CONTRAST  MRA HEAD WITHOUT CONTRAST  TECHNIQUE: Multiplanar, multiecho pulse sequences of the brain and surrounding structures were obtained without intravenous contrast. Angiographic images of the head were obtained using MRA technique without contrast.  COMPARISON:  08/06/2013 CT.  02/17/2013 MR.  FINDINGS: MRI HEAD FINDINGS  Exam is motion degraded.  Moderate size acute nonhemorrhagic infarct right periopercular region, right subinsular region and right lenticular nucleus and right caudate. Mild local mass effect upon the right lateral ventricle.  Tiny area blood breakdown products left cerebellum probably related to remote hemorrhagic ischemia. No other intracranial hemorrhage noted.  On the diffusion sequence, there are scattered tiny areas of restricted motion in a relatively symmetric fashion involving portions of the hemisphere bilaterally and cerebellum bilaterally. It is possible findings are related to watershed infarcts or embolic disease. Given the patient's history of lung cancer, tiny intracranial metastatic lesions  Global atrophy without hydrocephalus.  Diffuse altered signal intensity of the clivus and upper cervical spine bone marrow. This is new compared to 02/17/2013 exam and raises possibility of diffuse involvement by metastatic  disease. Result of anemia is a secondary less likely consideration.  Minimal to mild paranasal sinus mucosal thickening.  MRA HEAD FINDINGS  Mild narrowing super clinoid aspect of the internal carotid artery bilaterally.  Decrease number of visualized right middle cerebral artery branch vessels consistent with patient's acute infarct. Mild to moderate narrowing distal M1 segment right middle cerebral artery.  Moderate narrowing A1 segment left anterior cerebral artery.  Left middle cerebral artery branch vessel  irregularity.  Mild narrowing distal right vertebral artery.  Non visualized right anterior inferior cerebellar artery.  Narrowed irregular left posterior inferior cerebellar artery and left anterior inferior cerebellar artery.  No high-grade stenosis of the basilar artery.  Mild irregularity and narrowing of portions of the superior cerebellar artery and posterior cerebral artery bilaterally.  Tiny bulge P1 segment right posterior cerebral artery. A vessel appears to arise from this. Tiny aneurysm not entirely excluded. This is at the resolution of the present exam which is slightly motion degraded.  IMPRESSION: MRI HEAD IMPRESSION  Moderate size acute nonhemorrhagic infarct right periopercular region, right subinsular region and right lenticular nucleus and right caudate. Mild local mass effect upon the right lateral ventricle.  On the diffusion sequence, there are scattered tiny areas of restricted motion in a relatively symmetric fashion involving portions of the hemisphere bilaterally and cerebellum bilaterally. It is possible findings are related to watershed infarcts or embolic disease. Given the patient's history of lung cancer, tiny intracranial metastatic lesions cannot be completely excluded. Contrast was administered to evaluate this possibility.  Global atrophy without hydrocephalus.  Diffuse altered signal intensity of the clivus and upper cervical spine bone marrow. This is new compared to  02/17/2013 exam and raises possibility of diffuse involvement by metastatic disease. Result of anemia is a secondary less likely consideration.  Minimal to mild paranasal sinus mucosal thickening.  MRA HEAD IMPRESSION  Decrease number of visualized right middle cerebral artery branch vessels consistent with patient's acute infarct. Mild to moderate narrowing distal M1 segment right middle cerebral artery.  Intracranial atherosclerotic type changes otherwise as detailed above.  Tiny bulge P1 segment right posterior cerebral artery. A vessel appears to arise from this. Tiny aneurysm not entirely excluded. This is at the resolution of the present exam which is slightly motion degraded.  These results were called by telephone at the time of interpretation on 08/07/2013 at 1:43 PM to Tammy the pateients nurse, who verbally acknowledged these results.   Electronically Signed   By: Bridgett Larsson M.D.   On: 08/07/2013 13:49   Dg Swallowing Func-speech Pathology  08/07/2013   Lacinda Axon, CCC-SLP     08/07/2013 12:52 PM Objective Swallowing Evaluation: Modified Barium Swallowing Study   Patient Details  Name: Gabriel [name] MRN: [id number] Date of Birth: 06/30/1941  Today's Date: 08/07/2013 Time:10:30 to 11:00   Past Medical History:  Past Medical History  Diagnosis Date  . Cancer   . Hypertension   . Glaucoma   . Shortness of breath     with activity  . Stroke 08/06/2013    tpa given  . Myocardial infarction 08/06/2013   Past Surgical History:  Past Surgical History  Procedure Laterality Date  . Cataract extraction, bilateral Bilateral 2009   HPI:  Gabriel [name] is an 72 y.o. male, right handed, with a past  medical history significant for HTN, MI, metastatic lung cancer,  transferred to [facility] after receiving IV tpa at [facility] ED  earlier today.  MBS indicated following results of BSE.      Assessment / Plan / Recommendation Clinical Impression  Dysphagia Diagnosis: Mild oral phase dysphagia;Moderate  pharyngeal  phase dysphagia;Mild cervical esophageal phase  dysphagia Significant amount of aspiration during and after swallow of thin  liquid by spoon and cup.  Sensed aspiration but ineffective cough  to clear aspirated material.  Barium containment in vallecular  space s/p swallow of all consistencies.  Strategy of double  swallow effective in clearing residue.  Brief esophageal sweep  indicates slow bolus transit with significant amount of  containment in distal esophagus.  Appeared to be narrowing in  esophagus but no radiologist present to confirm.  Recommend to  proceed with dysphagia 2 ( finely chopped) diet consistency with  nectar thick liquids with full supervision with all meals.   Diagnostic treatment completed s/p study focusing on providing  education to patient and caregiver on diet rec's and necessary  swallow strategies to decrease risk for aspiration.  ST to follow  for diet tolerance and possible advancement.  Repeat MBS in 2 to  3 days with clinical improvement prior to advancing liquids if  indicated.      Treatment Recommendation  F/U MBS in ___ days (Comment) (2 to 3 days with clinical  improvement)    Diet Recommendation Dysphagia 2 (Fine chop);Nectar-thick liquid   Liquid Administration via: Cup;No straw Medication Administration: Whole meds with puree Supervision: Patient able to self feed;Full supervision/cueing  for compensatory strategies Compensations: Slow rate;Small sips/bites;Follow solids with  liquid;Multiple dry swallows after each bite/sip Postural Changes and/or Swallow Maneuvers: Seated upright 90  degrees;Upright 30-60 min after meal    Other  Recommendations Recommended Consults: MBS Oral Care Recommendations: Oral care Q4 per protocol Other Recommendations: Order thickener from pharmacy;Prohibited  food (jello, ice cream, thin soups);Remove water pitcher;Have  oral suction available;Clarify dietary restrictions   Follow Up Recommendations  Inpatient Rehab    Frequency and Duration min  2x/week  2 weeks       SLP Swallow Goals     General Date of Onset: 08/06/13 HPI: Gabriel [name] is an 72 y.o. male, right handed, with a past  medical history significant for HTN, MI, metastatic lung cancer,  transferred to [facility] after receiving IV tpa at [facility] ED  earlier today.   Type of Study: Modified Barium Swallowing Study Reason for Referral: Objectively evaluate swallowing function Diet Prior to this Study: NPO Temperature Spikes Noted: No Respiratory Status: Room air History of Recent Intubation: No Behavior/Cognition: Alert;Cooperative;Pleasant  mood;Distractible;Requires cueing Oral Cavity - Dentition: Missing dentition Oral Motor / Sensory Function: Impaired - see Bedside swallow  eval Self-Feeding Abilities: Able to feed self;Needs assist Patient Positioning: Upright in chair Baseline Vocal Quality: Clear Volitional Cough: Strong Volitional Swallow: Able to elicit Anatomy: Within functional limits Pharyngeal Secretions: Not observed secondary MBS    Reason for Referral Objectively evaluate swallowing function   Oral Phase Oral Preparation/Oral Phase Oral Phase: Impaired Oral - Nectar Oral - Nectar Teaspoon: Weak lingual manipulation;Piecemeal  swallowing;Holding of bolus Oral - Nectar Cup: Holding of bolus;Weak lingual  manipulation;Piecemeal swallowing Oral - Thin Oral - Thin Teaspoon: Weak lingual manipulation;Delayed oral  transit;Piecemeal swallowing Oral - Thin Cup: Holding of bolus;Weak lingual  manipulation;Piecemeal swallowing;Reduced posterior  propulsion;Delayed oral transit Oral - Solids Oral - Puree: Reduced posterior propulsion;Weak lingual  manipulation;Piecemeal swallowing;Delayed oral transit Oral - Mechanical Soft: Delayed oral transit;Piecemeal  swallowing;Reduced posterior propulsion;Weak lingual manipulation   Pharyngeal Phase Pharyngeal Phase Pharyngeal Phase: Impaired Pharyngeal - Nectar Pharyngeal - Nectar Teaspoon: Premature spillage to  valleculae;Reduced anterior  laryngeal mobility;Reduced  airway/laryngeal closure;Reduced tongue base  retraction;Pharyngeal residue - valleculae;Reduced pharyngeal  peristalsis Pharyngeal - Nectar Cup: Premature spillage to valleculae;Reduced  anterior laryngeal mobility;Reduced pharyngeal  peristalsis;Reduced laryngeal elevation;Reduced tongue base  retraction;Reduced airway/laryngeal closure;Pharyngeal residue -  valleculae Pharyngeal - Thin Pharyngeal - Thin Teaspoon: Premature spillage to  valleculae;Reduced anterior laryngeal mobility;Reduced laryngeal  elevation;Reduced pharyngeal peristalsis;Reduced airway/laryngeal  closure;Reduced tongue base retraction;Penetration/Aspiration  before swallow;Penetration/Aspiration  during swallow;Significant  aspiration (Amount);Compensatory strategies attempted (Comment)  (chin tuck not effective ) Penetration/Aspiration details (thin teaspoon): Material enters  airway, passes BELOW cords and not ejected out despite cough  attempt by patient Pharyngeal - Thin Cup: Premature spillage to valleculae;Reduced  pharyngeal peristalsis;Reduced anterior laryngeal  mobility;Reduced laryngeal elevation;Reduced tongue base  retraction;Penetration/Aspiration during  swallow;Penetration/Aspiration before swallow;Significant  aspiration (Amount) Penetration/Aspiration details (thin cup): Material enters  airway, passes BELOW cords and not ejected out despite cough  attempt by patient Pharyngeal - Solids Pharyngeal - Puree: Premature spillage to valleculae;Reduced  anterior laryngeal mobility;Reduced pharyngeal  peristalsis;Reduced laryngeal elevation;Reduced tongue base  retraction;Reduced airway/laryngeal closure Pharyngeal - Mechanical Soft: Premature spillage to  valleculae;Reduced pharyngeal peristalsis;Reduced anterior  laryngeal mobility;Reduced laryngeal elevation;Reduced  airway/laryngeal closure;Reduced tongue base  retraction;Pharyngeal residue - valleculae  Cervical Esophageal Phase    GO    Cervical  Esophageal Phase Cervical Esophageal Phase: Impaired Cervical Esophageal Phase - Comment Cervical Esophageal Comment: whole barium tablet lodged in distal  esophagus requiring multiple dry swallows to complete transit        Moreen Fowler MS, CCC-SLP [phone] [facility] 08/07/2013, 12:40 PM    Mr Maxine Glenn Head/brain Wo Cm  08/07/2013   CLINICAL DATA:  72 year old hypertensive patient with history of metastatic lung cancer. Presenting with left-sided weakness. Post IV TPA.  EXAM: MRI HEAD WITHOUT CONTRAST  MRA HEAD WITHOUT CONTRAST  TECHNIQUE: Multiplanar, multiecho pulse sequences of the brain and surrounding structures were obtained without intravenous contrast. Angiographic images of the head were obtained using MRA technique without contrast.  COMPARISON:  08/06/2013 CT.  02/17/2013 MR.  FINDINGS: MRI HEAD FINDINGS  Exam is motion degraded.  Moderate size acute nonhemorrhagic infarct right periopercular region, right subinsular region and right lenticular nucleus and right caudate. Mild local mass effect upon the right lateral ventricle.  Tiny area blood breakdown products left cerebellum probably related to remote hemorrhagic ischemia. No other intracranial hemorrhage noted.  On the diffusion sequence, there are scattered tiny areas of restricted motion in a relatively symmetric fashion involving portions of the hemisphere bilaterally and cerebellum bilaterally. It is possible findings are related to watershed infarcts or embolic disease. Given the patient's history of lung cancer, tiny intracranial metastatic lesions  Global atrophy without hydrocephalus.  Diffuse altered signal intensity of the clivus and upper cervical spine bone marrow. This is new compared to 02/17/2013 exam and raises possibility of diffuse involvement by metastatic disease. Result of anemia is a secondary less likely consideration.  Minimal to mild paranasal sinus mucosal thickening.  MRA HEAD FINDINGS  Mild narrowing super clinoid aspect  of the internal carotid artery bilaterally.  Decrease number of visualized right middle cerebral artery branch vessels consistent with patient's acute infarct. Mild to moderate narrowing distal M1 segment right middle cerebral artery.  Moderate narrowing A1 segment left anterior cerebral artery.  Left middle cerebral artery branch vessel irregularity.  Mild narrowing distal right vertebral artery.  Non visualized right anterior inferior cerebellar artery.  Narrowed irregular left posterior inferior cerebellar artery and left anterior inferior cerebellar artery.  No high-grade stenosis of the basilar artery.  Mild irregularity and narrowing of portions of the superior cerebellar artery and posterior cerebral artery bilaterally.  Tiny bulge P1 segment right posterior cerebral artery. A vessel appears to arise from this. Tiny aneurysm not entirely excluded. This is at the resolution of the present exam which is slightly motion degraded.  IMPRESSION: MRI HEAD IMPRESSION  Moderate size acute nonhemorrhagic infarct right periopercular region, right subinsular region  and right lenticular nucleus and right caudate. Mild local mass effect upon the right lateral ventricle.  On the diffusion sequence, there are scattered tiny areas of restricted motion in a relatively symmetric fashion involving portions of the hemisphere bilaterally and cerebellum bilaterally. It is possible findings are related to watershed infarcts or embolic disease. Given the patient's history of lung cancer, tiny intracranial metastatic lesions cannot be completely excluded. Contrast was administered to evaluate this possibility.  Global atrophy without hydrocephalus.  Diffuse altered signal intensity of the clivus and upper cervical spine bone marrow. This is new compared to 02/17/2013 exam and raises possibility of diffuse involvement by metastatic disease. Result of anemia is a secondary less likely consideration.  Minimal to mild paranasal sinus  mucosal thickening.  MRA HEAD IMPRESSION  Decrease number of visualized right middle cerebral artery branch vessels consistent with patient's acute infarct. Mild to moderate narrowing distal M1 segment right middle cerebral artery.  Intracranial atherosclerotic type changes otherwise as detailed above.  Tiny bulge P1 segment right posterior cerebral artery. A vessel appears to arise from this. Tiny aneurysm not entirely excluded. This is at the resolution of the present exam which is slightly motion degraded.  These results were called by telephone at the time of interpretation on 08/07/2013 at 1:43 PM to Tammy the pateients nurse, who verbally acknowledged these results.   Electronically Signed   By: Bridgett Larsson M.D.   On: 08/07/2013 13:49      A/P: 72 y.o. 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 He is currently on aspirin at 81 mg as well. He was noted to have low platelet counts on admission, initially 29,000, today 24,000, in the setting of recent chemotherapy, MI, polyharmacy and dilution., Smear has been ordered for review. DIC panel  HIT panel are currently pending.  Dr. Darnelle Catalan   is to see the patient following this consult with recommendations regarding diagnosis and  further workup studies.it would be of benefit to obtain as soon as possible records from [facility] for further details of his chemotherapy as well as lab comparison. An addendum to this note is to be written.    Thank you for the referral.  Marcos Eke, PA-C 08/09/2013 10:06 AM  ADDENDUM: 72 y/o [place], [place] man admitted 08/06/2013 on transfer from [facility] with a Right MCA stroke. There is a history of lung adenocarcinoma, today being day 6  cycle 3 of 4 planned chemotherapy cycles (possibly pemetrexed--we do not have records and patient does not know). He received tPA 08/06/2013. We are consulted re. Starting anti-platelet agents.  I have reviewed the APP's note and  Mr. Perkovich's DIC panel and blood film, which shows no platelet clumps, no schistocytes, and nonspecific changes in the white and red cell series. There is no evidence of TTP or DIC. He had a normal platelet count in April, so while ITP cannot be ruled out, most likely what we are seeing is chemotherapy induced thrombocytopenia. This should resolve within the next week.  Recommend:  1.I would not start ASA or other antiplatelet agents until platelet count rises >50K w/o transfusion. 2. I do not know if the patient's adenocarcinoma has been tested for EGFR or AKT mutations-- will review with his [facility] oncologist 3.  HAT test pending; he is not receiving heparin at present  Will follow peripherally. Please let me know if I can be of further help.     I personally saw this patient and performed a substantive portion of this encounter with the listed APP documented above.   Lowella Dell, MD

## 2013-08-09 NOTE — Progress Notes (Signed)
Stroke Team Progress Note  HISTORY  Tagen Milby is an 72 y.o. male, right handed, with a past medical history significant for HTN, MI, metastatic lung cancer, transferred to [facility] after receiving IV tpa at [facility] ED earlier today.   His wife tells me that he last saw him normal around 8 am today but review of his chart i normal at 8 am today and when she returned home around 1115 he was on the floor completely weak in the left sid. I was informed by the ED physician that his last seen normal was actually around 1130 am today and that his NIHSS was 5.   According to his wife he was also having slurred speech and left face weakness. Never had similar symptoms before.  Denies HA, vertigo, double vision, difficulty swallowing, confusion.  CT brain showed no acute abnormality and thus we decided to treat with IV thrombolysis.   Then, I was called again and was informed that patient had elevated troponin with normal CK-MB as well as normal EKG and that cardiology was already contacted.   Date last known well: 08/06/13  Time last known well: 8 am  tPA Given: yes  NIHSS: 5  MRS: 3   SUBJECTIVE Patient lying in room. No new neurological symptoms. No further chest pain. Left sided weakness persists.   OBJECTIVE Most recent Vital Signs: Filed Vitals:   08/08/13 1747 08/08/13 2200 08/09/13 0200 08/09/13 0600  BP: 154/64 143/61 136/76 144/61  Pulse: 77 81 81 81  Temp: 98.4 F (36.9 C) 97.8 F (36.6 C) 98 F (36.7 C) 97.5 F (36.4 C)  TempSrc: Oral     Resp: 16 18 18 18   Height:      Weight:      SpO2: 100% 99% 100% 99%   CBG (last 3)  No results found for this basename: GLUCAP,  in the last 72 hours  IV Fluid Intake:   . sodium chloride 75 mL/hr at 08/09/13 1045    MEDICATIONS  . atorvastatin  80 mg Oral q1800  . fentaNYL  25 mcg Transdermal Q72H  . loratadine  10 mg Oral Daily  . metoprolol tartrate  12.5 mg Oral BID  . pantoprazole  40 mg Oral QHS  . timolol  1  drop Both Eyes Daily   PRN:  acetaminophen, acetaminophen, labetalol, RESOURCE THICKENUP CLEAR, senna-docusate  Diet:  Dysphagia 2 Activity: up with assistance DVT Prophylaxis:  SCD  CLINICALLY SIGNIFICANT STUDIES Basic Metabolic Panel:   Recent Labs Lab 08/08/13 0525  NA 130*  K 3.8  CL 98  CO2 19  GLUCOSE 80  BUN 7  CREATININE 0.56  CALCIUM 8.1*   Liver Function Tests:   Recent Labs Lab 08/08/13 0525  AST 25  ALT 10  ALKPHOS 270*  BILITOT 0.6  PROT 6.2  ALBUMIN 2.9*   CBC:   Recent Labs Lab 08/08/13 0525 08/08/13 1120 08/09/13 0520  WBC 15.0* 16.4* 12.7*  NEUTROABS 12.8*  --   --   HGB 9.2* 9.7* 9.1*  HCT 28.2* 29.4* 27.6*  MCV 98.6 98.7 98.9  PLT 29* 26* 24*   Coagulation: No results found for this basename: LABPROT, INR,  in the last 168 hours Cardiac Enzymes:   Recent Labs Lab 08/06/13 1843 08/07/13 0030 08/07/13 0545  TROPONINI 4.19* 4.16* 2.56*   Urinalysis: No results found for this basename: COLORURINE, APPERANCEUR, LABSPEC, PHURINE, GLUCOSEU, HGBUR, BILIRUBINUR, KETONESUR, PROTEINUR, UROBILINOGEN, NITRITE, LEUKOCYTESUR,  in the last 168 hours Lipid Panel  Component Value Date/Time   CHOL 157 08/07/2013 0545   TRIG 139 08/07/2013 0545   HDL 33* 08/07/2013 0545   CHOLHDL 4.8 08/07/2013 0545   VLDL 28 08/07/2013 0545   LDLCALC 96 08/07/2013 0545   HgbA1C  Lab Results  Component Value Date   HGBA1C 5.5 08/07/2013    Urine Drug Screen:   No results found for this basename: labopia,  cocainscrnur,  labbenz,  amphetmu,  thcu,  labbarb    Alcohol Level: No results found for this basename: ETH,  in the last 168 hours  Dg Chest 2 View 08/06/2013  1. The appearance of the chest is similar to prior examinations demonstrating no definite acute cardiopulmonary disease, but evidence of primary right upper lobe neoplasm with metastatic disease, as above.       Dg Swallowing Func-speech Pathology 08/07/2013   Lacinda Axon, CCC-SLP      08/07/2013 12:52 PM Objective Swallowing Evaluation: Modified Barium Swallowing Study   Patient Details  Name: Spurgeon Gancarz MRN: [id number] Date of Birth: 11/20/1940  Today's Date: 08/07/2013 Time:10:30 to 11:00   Past Medical History:  Past Medical History  Diagnosis Date  . Cancer   . Hypertension   . Glaucoma   . Shortness of breath     with activity  . Stroke 08/06/2013    tpa given  . Myocardial infarction 08/06/2013   Past Surgical History:  Past Surgical History  Procedure Laterality Date  . Cataract extraction, bilateral Bilateral 2009   HPI:  Hodge Stachnik is an 72 y.o. male, right handed, with a past  medical history significant for HTN, MI, metastatic lung cancer,  transferred to [facility] after receiving IV tpa at [facility] ED  earlier today.  MBS indicated following results of BSE.      Assessment / Plan / Recommendation Clinical Impression  Dysphagia Diagnosis: Mild oral phase dysphagia;Moderate  pharyngeal phase dysphagia;Mild cervical esophageal phase  dysphagia Significant amount of aspiration during and after swallow of thin  liquid by spoon and cup.  Sensed aspiration but ineffective cough  to clear aspirated material.  Barium containment in vallecular  space s/p swallow of all consistencies.  Strategy of double  swallow effective in clearing residue.  Brief esophageal sweep  indicates slow bolus transit with significant amount of  containment in distal esophagus.  Appeared to be narrowing in  esophagus but no radiologist present to confirm.  Recommend to  proceed with dysphagia 2 ( finely chopped) diet consistency with  nectar thick liquids with full supervision with all meals.   Diagnostic treatment completed s/p study focusing on providing  education to patient and caregiver on diet rec's and necessary  swallow strategies to decrease risk for aspiration.  ST to follow  for diet tolerance and possible advancement.  Repeat MBS in 2 to  3 days with clinical improvement prior to advancing liquids  if  indicated.      Treatment Recommendation  F/U MBS in ___ days (Comment) (2 to 3 days with clinical  improvement)    Diet Recommendation Dysphagia 2 (Fine chop);Nectar-thick liquid   Liquid Administration via: Cup;No straw Medication Administration: Whole meds with puree Supervision: Patient able to self feed;Full supervision/cueing  for compensatory strategies Compensations: Slow rate;Small sips/bites;Follow solids with  liquid;Multiple dry swallows after each bite/sip Postural Changes and/or Swallow Maneuvers: Seated upright 90  degrees;Upright 30-60 min after meal    Other  Recommendations Recommended Consults: MBS Oral Care Recommendations: Oral care Q4 per protocol Other Recommendations: Order  thickener from pharmacy;Prohibited  food (jello, ice cream, thin soups);Remove water pitcher;Have  oral suction available;Clarify dietary restrictions   Follow Up Recommendations  Inpatient Rehab    Frequency and Duration min 2x/week  2 weeks       SLP Swallow Goals     General Date of Onset: 08/06/13 HPI: Yarden Hillis is an 72 y.o. male, right handed, with a past  medical history significant for HTN, MI, metastatic lung cancer,  transferred to [facility] after receiving IV tpa at [facility] ED  earlier today.   Type of Study: Modified Barium Swallowing Study Reason for Referral: Objectively evaluate swallowing function Diet Prior to this Study: NPO Temperature Spikes Noted: No Respiratory Status: Room air History of Recent Intubation: No Behavior/Cognition: Alert;Cooperative;Pleasant  mood;Distractible;Requires cueing Oral Cavity - Dentition: Missing dentition Oral Motor / Sensory Function: Impaired - see Bedside swallow  eval Self-Feeding Abilities: Able to feed self;Needs assist Patient Positioning: Upright in chair Baseline Vocal Quality: Clear Volitional Cough: Strong Volitional Swallow: Able to elicit Anatomy: Within functional limits Pharyngeal Secretions: Not observed secondary MBS    Reason for Referral  Objectively evaluate swallowing function   Oral Phase Oral Preparation/Oral Phase Oral Phase: Impaired Oral - Nectar Oral - Nectar Teaspoon: Weak lingual manipulation;Piecemeal  swallowing;Holding of bolus Oral - Nectar Cup: Holding of bolus;Weak lingual  manipulation;Piecemeal swallowing Oral - Thin Oral - Thin Teaspoon: Weak lingual manipulation;Delayed oral  transit;Piecemeal swallowing Oral - Thin Cup: Holding of bolus;Weak lingual  manipulation;Piecemeal swallowing;Reduced posterior  propulsion;Delayed oral transit Oral - Solids Oral - Puree: Reduced posterior propulsion;Weak lingual  manipulation;Piecemeal swallowing;Delayed oral transit Oral - Mechanical Soft: Delayed oral transit;Piecemeal  swallowing;Reduced posterior propulsion;Weak lingual manipulation   Pharyngeal Phase Pharyngeal Phase Pharyngeal Phase: Impaired Pharyngeal - Nectar Pharyngeal - Nectar Teaspoon: Premature spillage to  valleculae;Reduced anterior laryngeal mobility;Reduced  airway/laryngeal closure;Reduced tongue base  retraction;Pharyngeal residue - valleculae;Reduced pharyngeal  peristalsis Pharyngeal - Nectar Cup: Premature spillage to valleculae;Reduced  anterior laryngeal mobility;Reduced pharyngeal  peristalsis;Reduced laryngeal elevation;Reduced tongue base  retraction;Reduced airway/laryngeal closure;Pharyngeal residue -  valleculae Pharyngeal - Thin Pharyngeal - Thin Teaspoon: Premature spillage to  valleculae;Reduced anterior laryngeal mobility;Reduced laryngeal  elevation;Reduced pharyngeal peristalsis;Reduced airway/laryngeal  closure;Reduced tongue base retraction;Penetration/Aspiration  before swallow;Penetration/Aspiration during swallow;Significant  aspiration (Amount);Compensatory strategies attempted (Comment)  (chin tuck not effective ) Penetration/Aspiration details (thin teaspoon): Material enters  airway, passes BELOW cords and not ejected out despite cough  attempt by patient Pharyngeal - Thin Cup: Premature  spillage to valleculae;Reduced  pharyngeal peristalsis;Reduced anterior laryngeal  mobility;Reduced laryngeal elevation;Reduced tongue base  retraction;Penetration/Aspiration during  swallow;Penetration/Aspiration before swallow;Significant  aspiration (Amount) Penetration/Aspiration details (thin cup): Material enters  airway, passes BELOW cords and not ejected out despite cough  attempt by patient Pharyngeal - Solids Pharyngeal - Puree: Premature spillage to valleculae;Reduced  anterior laryngeal mobility;Reduced pharyngeal  peristalsis;Reduced laryngeal elevation;Reduced tongue base  retraction;Reduced airway/laryngeal closure Pharyngeal - Mechanical Soft: Premature spillage to  valleculae;Reduced pharyngeal peristalsis;Reduced anterior  laryngeal mobility;Reduced laryngeal elevation;Reduced  airway/laryngeal closure;Reduced tongue base  retraction;Pharyngeal residue - valleculae  Cervical Esophageal Phase    GO    Cervical Esophageal Phase Cervical Esophageal Phase: Impaired Cervical Esophageal Phase - Comment Cervical Esophageal Comment: whole barium tablet lodged in distal  esophagus requiring multiple dry swallows to complete transit        Moreen Fowler MS, CCC-SLP [phone] [facility] 08/07/2013, 12:40 PM    Mr Maxine Glenn Head/brain Wo Cm 08/07/2013  Decrease number of visualized right middle cerebral artery branch vessels consistent with patient's  acute infarct. Mild to moderate narrowing distal M1 segment right middle cerebral artery.  Intracranial atherosclerotic type changes otherwise as detailed above.  Tiny bulge P1 segment right posterior cerebral artery. A vessel appears to arise from this. Tiny aneurysm not entirely excluded. This is at the resolution of the present exam which is slightly motion degraded.      CT of the brain  Done at [facility], reported as negative  MRI of the brain  Moderate size acute nonhemorrhagic infarct right periopercular region, right subinsular region and right  lenticular nucleus and right caudate. Mild local mass effect upon the right lateral ventricle. On the diffusion sequence, there are scattered tiny areas of restricted motion in a relatively symmetric fashion involving portions of the hemisphere bilaterally and cerebellum bilaterally. It is possible findings are related to watershed infarcts or embolic disease. Given the patient's history of lung cancer, tiny intracranial metastatic lesions cannot be completely excluded. Contrast was administered to evaluate this possibility. Global atrophy without hydrocephalus. Diffuse altered signal intensity of the clivus and upper cervical spine bone marrow. This is new compared to 02/17/2013 exam and raises possibility of diffuse involvement by metastatic disease. Result of anemia is a secondary less likely consideration. Minimal to mild paranasal sinus mucosal thickening.   MRA of the brain  Decrease number of visualized right middle cerebral artery branch vessels consistent with patient's acute infarct. Mild to moderate narrowing distal M1 segment right middle cerebral artery. Tiny bulge P1 segment right posterior cerebral artery. A vessel appears to arise from this. Tiny aneurysm not entirely excluded.   2D Echocardiogram  EF 60%, wall motion normal, no cardiac source of embolism identified. LA normal in size.  Carotid Doppler  Findings suggest 1-39% right internal carotid artery stenosis and elevated peak systolic velocities of the left proximal internal carotid artery suggestive of 40-59% stenosis, however end diastolic velocities and ICA/CCA ratio suggest upper range 1-39% stenosis. Vertebral arteries are patent with antegrade flow.   CXR   IMPRESSION:  1. The appearance of the chest is similar to prior examinations  demonstrating no definite acute cardiopulmonary disease, but  evidence of primary right upper lobe neoplasm with metastatic  disease, as above.  EKG     Therapy Recommendations  CIR   Physical Exam  General: The patient is alert and cooperative at the time of the examination.  Respiratory: lung fields are clear  Cardiovascular: There is a regular rhythm, no murmurs  Skin: No significant peripheral edema is noted.   Neurologic Exam   Awake alert oriented x 3. Follows commands well  Cranial nerves:   Extraocular movements are full.  The patient has a left HH.Speech is dysarthric.  Motor: The patient has good strength in the right extremities. There is 4/5 strength in the left arm, and 4+/5 strength of the left leg.  Sensory: soft touch is symmetric on all fours and face.  Coordination: The patient has good finger-nose-finger and heel-to-shin on the right,  Dysmetria of the left arm, normal left leg.  Gait and station: The patient was not ambulated, at bed rest.  Reflexes: Deep tendon reflexes are symmetric.    ASSESSMENT Mr. Rayne Loiseau is a 72 y.o. male presenting with right brain stroke. Not on antiplatelet medications at home. The patient received TPA at 14:00 yesterday. Follow up MRI showed no hemorrhage; however an acute nonhemorrhagic infarct right periopercular region, right subinsular region and right lenticular nucleus and right caudate. Also, the patient has known metastatic lung cancer. The  patient also had chest pain and suffered myocardial infarction during this event. Due to thrombocytopenia, baby aspirin has been stopped.  -Right brain CVA -Metastatic lung cancer, pain control. Patient's med list shows that he is on fentanyl + at home; however pharmacy has found out that he is only on the dose. I have stopped the fentanyl patch. -non-STEMI -Hypertension -Urinary retention: will keep foley in while heme/onc see's patient. Would want to avoid having to replace indwelling catheter with low platelet count. -thrombocytopenia, 29K -lipids, 96, on statin -HGB A1C    5.5   Hospital day # 3  TREATMENT/PLAN  Risk  factor modification  Cardiac recommendations per consultants  Daily CBC to follow platelets: at this time baby aspirin has been discontinued.  Hematology consulted. If felt agreeable by hematology, would like to place patient on a baby aspirin 81mg  daily for secondary stroke prevention, timing per their recommendation if at all.  Plan for CIR. After hematology/oncology consult and if patient remains hemodynamically stable overnight, should be medically ready to move to CIR in am if bed available.  Gwendolyn Lima. Manson Passey, [facility], MBA, MHA [facility] Pager: [phone] 08/09/2013 11:20 AM  I have personally obtained a history, examined the patient, evaluated imaging results, and formulated the assessment and plan of care. I agree with the above. Delia Heady, MD

## 2013-08-09 NOTE — Progress Notes (Signed)
Speech Language Pathology Treatment: Dysphagia;Cognitive-Linquistic  Patient Details Name: Gabriel [name] MRN: [id number] DOB: 1941/09/17 Today's Date: 08/09/2013 Time: 1050-1100 SLP Time Calculation (min): 10 min  Assessment / Plan / Recommendation Clinical Impression  Pt continues to demonstrate progress with awareness of deficits, required moderate to max question and contextual cues  And visual feedback to verbalize reasoning and intellectual awareness of deficits. Able to verbalize swallow strategy with min question cues but requires verbal reminders to implement second swallow. Will continue efforts.    HPI     Pertinent Vitals NA  SLP Plan  Continue with current plan of care    Recommendations Diet recommendations: Dysphagia 2 (fine chop);Nectar-thick liquid Liquids provided via: Cup Medication Administration: Whole meds with puree Supervision: Full supervision/cueing for compensatory strategies Compensations: Slow rate;Small sips/bites;Check for pocketing;Multiple dry swallows after each bite/sip Postural Changes and/or Swallow Maneuvers: Seated upright 90 degrees;Upright 30-60 min after meal              General recommendations: Rehab consult Oral Care Recommendations: Oral care before and after PO Follow up Recommendations: Inpatient Rehab Plan: Continue with current plan of care    GO    [facility] MA CCC-SLP [phone]  Claudine Mouton 08/09/2013, 12:02 PM

## 2013-08-09 NOTE — Progress Notes (Signed)
I met with pt at bedside. Discussed inpt rehab admission and he is in agreement. Await medical readiness. I will discuss also with his wife. Possible admission tomorrow. [phone]

## 2013-08-09 NOTE — Progress Notes (Signed)
       Patient Name: Gabriel [name]      SUBJECTIVE: admitted with CVA>>tPA and +NSTEMI  No chest pain or dyspnea  Past Medical History  Diagnosis Date  . Cancer   . Hypertension   . Glaucoma   . Shortness of breath     with activity  . Stroke 08/06/2013    tpa given  . NSTEMI (non-ST elevated myocardial infarction) 08/06/2013    Scheduled Meds:  Scheduled Meds: . atorvastatin  80 mg Oral q1800  . fentaNYL  25 mcg Transdermal Q72H  . loratadine  10 mg Oral Daily  . pantoprazole  40 mg Oral QHS  . timolol  1 drop Both Eyes Daily   Continuous Infusions: . sodium chloride 1,000 mL (08/08/13 2145)    PHYSICAL EXAM Filed Vitals:   08/08/13 1747 08/08/13 2200 08/09/13 0200 08/09/13 0600  BP: 154/64 143/61 136/76 144/61  Pulse: 77 81 81 81  Temp: 98.4 F (36.9 C) 97.8 F (36.6 C) 98 F (36.7 C) 97.5 F (36.4 C)  TempSrc: Oral     Resp: 16 18 18 18   Height:      Weight:      SpO2: 100% 99% 100% 99%   Well developed and nourished in no acute distress HENTfacial droop Neck supple   Clear Regular rate and rhythm, no murmurs or gallops Abd-soft with active BS No edema Skin-warm and dry A & Oriented; left side weakness.   TELEMETRY: Reviewed telemetry pt in NSR    Intake/Output Summary (Last 24 hours) at 08/09/13 0858 Last data filed at 08/09/13 0000  Gross per 24 hour  Intake    120 ml  Output   2801 ml  Net  -2681 ml    LABS: Basic Metabolic Panel:  Recent Labs Lab 08/08/13 0525  NA 130*  K 3.8  CL 98  CO2 19  GLUCOSE 80  BUN 7  CREATININE 0.56  CALCIUM 8.1*   Cardiac Enzymes:  Recent Labs  08/06/13 1843 08/07/13 0030 08/07/13 0545  TROPONINI 4.19* 4.16* 2.56*   CBC:  Recent Labs Lab 08/08/13 0525 08/08/13 1120 08/09/13 0520  WBC 15.0* 16.4* 12.7*  NEUTROABS 12.8*  --   --   HGB 9.2* 9.7* 9.1*  HCT 28.2* 29.4* 27.6*  MCV 98.6 98.7 98.9  PLT 29* 26* 24*   Liver Function Tests:  Recent Labs  08/08/13 0525  AST 25   ALT 10  ALKPHOS 270*  BILITOT 0.6  PROT 6.2  ALBUMIN 2.9*    Recent Labs  08/07/13 0545  HGBA1C 5.5   Fasting Lipid Panel:  Recent Labs  08/07/13 0545  CHOL 157  HDL 33*  LDLCALC 96  TRIG 784  CHOLHDL 4.8  *   ASSESSMENT AND PLAN:  Active Problems:   NSTEMI (non-ST elevated myocardial infarction)   Stroke  Patient remains asymptomatic. LV function is normal on echocardiogram. Given his lung cancer and recent stroke as well as thrombocytopenia he is not a candidate for aggressive cardiac evaluation at this time. Avoid aspirin given thrombocytopenia. Add low-dose metoprolol. Continue statin.  Signed, Olga Millers MD  08/09/2013

## 2013-08-09 NOTE — Progress Notes (Signed)
Physical Therapy Treatment Patient Details Name: Gabriel [name] MRN: [id number] DOB: [birth year] Today's Date: 08/09/2013 Time: 4098-1191 PT Time Calculation (min): 28 min  PT Assessment / Plan / Recommendation  History of Present Illness 72 yo male admitted with Lt side weakness and elevated troponin levels. Pt now dx with Nstemi, s/p TPA, Rt CVA   PT Comments   Patient motivated and eager to work with therapy. Slightly impulsive and unware of deficits at times. CIR coordinator in during session and is planning to DC patient to CIR tomorrow.   Follow Up Recommendations  CIR     Does the patient have the potential to tolerate intense rehabilitation     Barriers to Discharge        Equipment Recommendations  Rolling walker with 5" wheels;3in1 (PT)    Recommendations for Other Services    Frequency Min 4X/week   Progress towards PT Goals Progress towards PT goals: Progressing toward goals  Plan Current plan remains appropriate    Precautions / Restrictions Precautions Precautions: Fall   Pertinent Vitals/Pain Denied pain    Mobility  Bed Mobility Supine to Sit: 2: Max assist;HOB elevated;With rails Sitting - Scoot to Edge of Bed: 3: Mod assist Details for Bed Mobility Assistance: Patient needing tactile cues to intiate legs off of bed. A at shoulders and for trunk control to come up into sitting Transfers Sit to Stand: 3: Mod assist;With upper extremity assist;From bed Stand to Sit: 3: Mod assist;With upper extremity assist;To chair/3-in-1 Details for Transfer Assistance: A to initate stand and to ensure balacne with upright posture. Patient continues with heavy L lean in standing with cues and A with weight shifting to reach midline Ambulation/Gait Ambulation/Gait Assistance: 1: +2 Total assist Ambulation/Gait: Patient Percentage: 60% Ambulation Distance (Feet): 4 Feet Assistive device: 2 person hand held assist Ambulation/Gait Assistance Details: Patient with very heavy  L lean and A to advance LLE. Patient ataxic with foot placement.  Gait Pattern: Ataxic    Exercises     PT Diagnosis:    PT Problem List:   PT Treatment Interventions:     PT Goals (current goals can now be found in the care plan section)    Visit Information  Last PT Received On: 08/09/13 Assistance Needed: +2 History of Present Illness: 73 yo male admitted with Lt side weakness and elevated troponin levels. Pt now dx with Nstemi, s/p TPA, Rt CVA    Subjective Data      Cognition  Cognition Arousal/Alertness: Awake/alert Behavior During Therapy: WFL for tasks assessed/performed Overall Cognitive Status: Impaired/Different from baseline Area of Impairment: Safety/judgement;Awareness;Problem solving Safety/Judgement: Decreased awareness of deficits;Decreased awareness of safety Problem Solving: Slow processing;Difficulty sequencing    Balance  Static Sitting Balance Static Sitting - Balance Support: Feet supported;No upper extremity supported Static Sitting - Level of Assistance: 3: Mod assist Static Sitting - Comment/# of Minutes: Conitnues to need assistance for midline posture. L lateral lean with sitting. Increased with fatigue  End of Session PT - End of Session Equipment Utilized During Treatment: Gait belt Activity Tolerance: Patient tolerated treatment well Patient left: in chair;with call bell/phone within reach;with chair alarm set Nurse Communication: Mobility status   GP     Fredrich Birks 08/09/2013, 2:43 PM  08/09/2013 Fredrich Birks PTA [phone] pager [phone] office

## 2013-08-10 ENCOUNTER — Encounter (HOSPITAL_COMMUNITY): Payer: Self-pay | Admitting: Internal Medicine

## 2013-08-10 ENCOUNTER — Inpatient Hospital Stay (HOSPITAL_COMMUNITY)
Admission: RE | Admit: 2013-08-10 | Discharge: 2013-08-24 | DRG: 945 | Disposition: A | Discharge status: Skilled Nursing Facility | Payer: Medicare Other | Source: Intra-hospital | Attending: Physical Medicine & Rehabilitation | Admitting: Physical Medicine & Rehabilitation

## 2013-08-10 DIAGNOSIS — I6330 Cerebral infarction due to thrombosis of unspecified cerebral artery: Secondary | ICD-10-CM

## 2013-08-10 DIAGNOSIS — E785 Hyperlipidemia, unspecified: Secondary | ICD-10-CM | POA: Diagnosis present

## 2013-08-10 DIAGNOSIS — I2510 Atherosclerotic heart disease of native coronary artery without angina pectoris: Secondary | ICD-10-CM | POA: Diagnosis present

## 2013-08-10 DIAGNOSIS — I252 Old myocardial infarction: Secondary | ICD-10-CM

## 2013-08-10 DIAGNOSIS — Z8673 Personal history of transient ischemic attack (TIA), and cerebral infarction without residual deficits: Secondary | ICD-10-CM | POA: Diagnosis not present

## 2013-08-10 DIAGNOSIS — H409 Unspecified glaucoma: Secondary | ICD-10-CM | POA: Diagnosis present

## 2013-08-10 DIAGNOSIS — D696 Thrombocytopenia, unspecified: Secondary | ICD-10-CM | POA: Diagnosis present

## 2013-08-10 DIAGNOSIS — F411 Generalized anxiety disorder: Secondary | ICD-10-CM | POA: Diagnosis present

## 2013-08-10 DIAGNOSIS — I214 Non-ST elevation (NSTEMI) myocardial infarction: Secondary | ICD-10-CM | POA: Diagnosis present

## 2013-08-10 DIAGNOSIS — D649 Anemia, unspecified: Secondary | ICD-10-CM | POA: Diagnosis present

## 2013-08-10 DIAGNOSIS — Z87891 Personal history of nicotine dependence: Secondary | ICD-10-CM

## 2013-08-10 DIAGNOSIS — G8929 Other chronic pain: Secondary | ICD-10-CM | POA: Diagnosis present

## 2013-08-10 DIAGNOSIS — Z79899 Other long term (current) drug therapy: Secondary | ICD-10-CM

## 2013-08-10 DIAGNOSIS — R339 Retention of urine, unspecified: Secondary | ICD-10-CM | POA: Diagnosis not present

## 2013-08-10 DIAGNOSIS — G893 Neoplasm related pain (acute) (chronic): Secondary | ICD-10-CM | POA: Diagnosis present

## 2013-08-10 DIAGNOSIS — Z7982 Long term (current) use of aspirin: Secondary | ICD-10-CM

## 2013-08-10 DIAGNOSIS — R414 Neurologic neglect syndrome: Secondary | ICD-10-CM | POA: Diagnosis present

## 2013-08-10 DIAGNOSIS — M542 Cervicalgia: Secondary | ICD-10-CM | POA: Diagnosis present

## 2013-08-10 DIAGNOSIS — C3490 Malignant neoplasm of unspecified part of unspecified bronchus or lung: Secondary | ICD-10-CM | POA: Diagnosis present

## 2013-08-10 DIAGNOSIS — I63511 Cerebral infarction due to unspecified occlusion or stenosis of right middle cerebral artery: Secondary | ICD-10-CM

## 2013-08-10 DIAGNOSIS — Z5189 Encounter for other specified aftercare: Principal | ICD-10-CM

## 2013-08-10 DIAGNOSIS — Z515 Encounter for palliative care: Secondary | ICD-10-CM

## 2013-08-10 DIAGNOSIS — R1310 Dysphagia, unspecified: Secondary | ICD-10-CM | POA: Diagnosis present

## 2013-08-10 DIAGNOSIS — I10 Essential (primary) hypertension: Secondary | ICD-10-CM | POA: Diagnosis present

## 2013-08-10 DIAGNOSIS — I639 Cerebral infarction, unspecified: Secondary | ICD-10-CM

## 2013-08-10 DIAGNOSIS — I6350 Cerebral infarction due to unspecified occlusion or stenosis of unspecified cerebral artery: Principal | ICD-10-CM

## 2013-08-10 DIAGNOSIS — G8190 Hemiplegia, unspecified affecting unspecified side: Secondary | ICD-10-CM | POA: Diagnosis present

## 2013-08-10 HISTORY — DX: Thrombocytopenia, unspecified: D69.6

## 2013-08-10 LAB — CBC
HCT: 25.5 % — ABNORMAL LOW (ref 39.0–52.0)
Hemoglobin: 8.7 g/dL — ABNORMAL LOW (ref 13.0–17.0)
MCH: 33.2 pg (ref 26.0–34.0)
MCHC: 34.1 g/dL (ref 30.0–36.0)
MCV: 97.3 fL (ref 78.0–100.0)
RDW: 18.4 % — ABNORMAL HIGH (ref 11.5–15.5)
WBC: 9.8 10*3/uL (ref 4.0–10.5)

## 2013-08-10 MED ORDER — TIMOLOL MALEATE 0.5 % OP SOLN
1.0000 [drp] | Freq: Every day | OPHTHALMIC | Status: DC
  Administered 2013-08-11 – 2013-08-24 (×14): 1 [drp] via OPHTHALMIC
  Filled 2013-08-10: qty 5

## 2013-08-10 MED ORDER — ATORVASTATIN CALCIUM 80 MG PO TABS
80.0000 mg | ORAL_TABLET | Freq: Every day | ORAL | Status: DC
  Administered 2013-08-10 – 2013-08-23 (×14): 80 mg via ORAL
  Filled 2013-08-10 (×15): qty 1

## 2013-08-10 MED ORDER — ONDANSETRON HCL 4 MG PO TABS
4.0000 mg | ORAL_TABLET | Freq: Four times a day (QID) | ORAL | Status: DC | PRN
  Administered 2013-08-14: 4 mg via ORAL
  Filled 2013-08-10: qty 1

## 2013-08-10 MED ORDER — METOPROLOL TARTRATE 12.5 MG HALF TABLET
12.5000 mg | ORAL_TABLET | Freq: Two times a day (BID) | ORAL | Status: DC
  Administered 2013-08-10 – 2013-08-24 (×27): 12.5 mg via ORAL
  Filled 2013-08-10 (×33): qty 1

## 2013-08-10 MED ORDER — FENTANYL 25 MCG/HR TD PT72
25.0000 ug | MEDICATED_PATCH | TRANSDERMAL | Status: DC
  Administered 2013-08-11 – 2013-08-14 (×2): 25 ug via TRANSDERMAL
  Filled 2013-08-10 (×2): qty 1

## 2013-08-10 MED ORDER — LORATADINE 10 MG PO TABS
10.0000 mg | ORAL_TABLET | Freq: Every day | ORAL | Status: DC
  Administered 2013-08-11 – 2013-08-24 (×13): 10 mg via ORAL
  Filled 2013-08-10 (×19): qty 1

## 2013-08-10 MED ORDER — ACETAMINOPHEN 325 MG PO TABS
650.0000 mg | ORAL_TABLET | ORAL | Status: DC | PRN
  Administered 2013-08-15 – 2013-08-22 (×4): 650 mg via ORAL
  Filled 2013-08-10 (×4): qty 2

## 2013-08-10 MED ORDER — HYDROCODONE-ACETAMINOPHEN 10-325 MG PO TABS
1.0000 | ORAL_TABLET | Freq: Four times a day (QID) | ORAL | Status: DC | PRN
  Administered 2013-08-11 – 2013-08-17 (×14): 1 via ORAL
  Filled 2013-08-10 (×15): qty 1

## 2013-08-10 MED ORDER — ONDANSETRON HCL 4 MG/2ML IJ SOLN: 4.0000 mg | Freq: Four times a day (QID) | INTRAMUSCULAR | Status: DC | PRN

## 2013-08-10 MED ORDER — HYDROCODONE-ACETAMINOPHEN 10-325 MG PO TABS: 1.0000 | ORAL_TABLET | Freq: Four times a day (QID) | ORAL | Status: DC | PRN

## 2013-08-10 MED ORDER — ACETAMINOPHEN 650 MG RE SUPP: 650.0000 mg | RECTAL | Status: DC | PRN

## 2013-08-10 MED ORDER — RESOURCE THICKENUP CLEAR PO POWD
ORAL | Status: DC | PRN
  Filled 2013-08-10: qty 125

## 2013-08-10 MED ORDER — PANTOPRAZOLE SODIUM 40 MG PO TBEC
40.0000 mg | DELAYED_RELEASE_TABLET | Freq: Every day | ORAL | Status: DC
  Administered 2013-08-10 – 2013-08-23 (×14): 40 mg via ORAL
  Filled 2013-08-10 (×13): qty 1

## 2013-08-10 MED ORDER — SORBITOL 70 % SOLN
30.0000 mL | Freq: Every day | Status: DC | PRN
  Administered 2013-08-19 – 2013-08-21 (×3): 30 mL via ORAL
  Filled 2013-08-10 (×4): qty 30

## 2013-08-10 MED ORDER — SENNOSIDES-DOCUSATE SODIUM 8.6-50 MG PO TABS
1.0000 | ORAL_TABLET | Freq: Every evening | ORAL | Status: DC | PRN
  Administered 2013-08-13: 1 via ORAL
  Filled 2013-08-10 (×3): qty 1

## 2013-08-10 NOTE — Progress Notes (Signed)
Inpt rehab bed is available today and pt is in agreement to admission. I have discussed with Guy Franco, PA and she is in agreement. I will arrange. [phone].

## 2013-08-10 NOTE — Progress Notes (Signed)
Report given to Alvino Chapel, RN from [phone].

## 2013-08-10 NOTE — Progress Notes (Signed)
FOLLOW-UP NOTE:   Platelets slightly down this AM. No indication for platelet transfusion. Would continue to hold off on anti-platelet agents as suggested in consult note. Anticipate recovery of platelet count over next several days.   Would transfuse platelets if 10K or less or if there is overt bleeding. Would initialte anti platelet agents when count 50K or higher.  Will follow peripherally to resolution.

## 2013-08-10 NOTE — Progress Notes (Signed)
OT NOTE  Pt with pending admission to CIR today. OT will defer next treatment to CIR.    Mateo Flow   OTR/L Pager: [phone] Office: [phone] .

## 2013-08-10 NOTE — Progress Notes (Signed)
       Patient Name: Gabriel [name]      SUBJECTIVE: admitted with CVA>>tPA and +NSTEMI  No chest pain or dyspnea Thrombocytopenia is profound  Past Medical History  Diagnosis Date  . Cancer   . Hypertension   . Glaucoma   . Shortness of breath     with activity  . Stroke 08/06/2013    tpa given  . NSTEMI (non-ST elevated myocardial infarction) 08/06/2013    Scheduled Meds:  Scheduled Meds: . atorvastatin  80 mg Oral q1800  . fentaNYL  25 mcg Transdermal Q72H  . loratadine  10 mg Oral Daily  . metoprolol tartrate  12.5 mg Oral BID  . pantoprazole  40 mg Oral QHS  . timolol  1 drop Both Eyes Daily   Continuous Infusions: . sodium chloride 1,000 mL (08/10/13 0419)    PHYSICAL EXAM Filed Vitals:   08/09/13 1742 08/09/13 2200 08/10/13 0200 08/10/13 0600  BP: 139/68 133/62 138/67 143/67  Pulse: 72 77 80 78  Temp: 98.6 F (37 C) 97.8 F (36.6 C) 97.6 F (36.4 C) 98.1 F (36.7 C)  TempSrc: Oral     Resp: 18 18 18 18   Height:      Weight:      SpO2: 100% 97% 99% 100%   Well developed and nourished in no acute distress HENTfacial droop Neck supple   Clear Regular rate and rhythm, no murmurs or gallops Abd-soft with active BS No edema Skin-warm and dry A & Oriented; left side weakness.   TELEMETRY: Reviewed telemetry pt in NSR    Intake/Output Summary (Last 24 hours) at 08/10/13 0837 Last data filed at 08/09/13 1800  Gross per 24 hour  Intake    990 ml  Output   1200 ml  Net   -210 ml    LABS: Basic Metabolic Panel:  Recent Labs Lab 08/08/13 0525  NA 130*  K 3.8  CL 98  CO2 19  GLUCOSE 80  BUN 7  CREATININE 0.56  CALCIUM 8.1*   Cardiac Enzymes: No results found for this basename: CKTOTAL, CKMB, CKMBINDEX, TROPONINI,  in the last 72 hours CBC:  Recent Labs Lab 08/08/13 0525 08/08/13 1120 08/09/13 0520 08/09/13 1050 08/10/13 0530  WBC 15.0* 16.4* 12.7*  --  9.8  NEUTROABS 12.8*  --   --   --   --   HGB 9.2* 9.7* 9.1*  --   8.7*  HCT 28.2* 29.4* 27.6*  --  25.5*  MCV 98.6 98.7 98.9  --  97.3  PLT 29* 26* 24* 23* 19*   Liver Function Tests:  Recent Labs  08/08/13 0525  AST 25  ALT 10  ALKPHOS 270*  BILITOT 0.6  PROT 6.2  ALBUMIN 2.9*   No results found for this basename: HGBA1C,  in the last 72 hours Fasting Lipid Panel: No results found for this basename: CHOL, HDL, LDLCALC, TRIG, CHOLHDL, LDLDIRECT,  in the last 72 hours*   ASSESSMENT AND PLAN:  Active Problems:   NSTEMI (non-ST elevated myocardial infarction)   Stroke  Patient remains asymptomatic. LV function is normal on echocardiogram. Given his lung cancer and recent stroke as well as thrombocytopenia he is not a candidate for aggressive cardiac evaluation at this time. Avoid aspirin given thrombocytopenia. contiue  low-dose metoprool and  statin.  Signed, Sherryl Manges MD  08/10/2013

## 2013-08-10 NOTE — Discharge Summary (Signed)
Stroke Discharge Summary  Patient ID: Gabriel [name]   MRN: [id number]      DOB: 28-May-1941  Date of Admission: 08/06/2013 Date of Discharge: 08/10/2013  Attending Physician:  Darcella Cheshire, MD, Stroke MD  Consulting Physician(s):  Treatment Team:  Rounding Lbcardiology, MD Lowella Dell, MD rehabilitation medicine  Patient's PCP:  No primary provider on file.  Discharge Diagnoses:  Principal Problem:   Arterial ischemic stroke, MCA (middle cerebral artery), right, acute Active Problems:   Malignant tumor of vertebral column   NSTEMI (non-ST elevated myocardial infarction)   Stroke   Thrombocytopenia   Essential hypertension, benign   Encounter for long-term (current) use of medications BMI  Body mass index is 20.27 kg/(m^2).  Past Medical History  Diagnosis Date  . Cancer   . Hypertension   . Glaucoma   . Shortness of breath     with activity  . Stroke 08/06/2013    tpa given  . NSTEMI (non-ST elevated myocardial infarction) 08/06/2013  . Thrombocytopenia 08/10/2013   Past Surgical History  Procedure Laterality Date  . Cataract extraction, bilateral Bilateral 2009    Medications to be continued on Rehab . atorvastatin  80 mg Oral q1800  . fentaNYL  25 mcg Transdermal Q72H  . loratadine  10 mg Oral Daily  . metoprolol tartrate  12.5 mg Oral BID  . pantoprazole  40 mg Oral QHS  . timolol  1 drop Both Eyes Daily    LABORATORY STUDIES CBC    Component Value Date/Time   WBC 9.8 08/10/2013 0530   RBC 2.62* 08/10/2013 0530   HGB 8.7* 08/10/2013 0530   HCT 25.5* 08/10/2013 0530   PLT 19* 08/10/2013 0530   MCV 97.3 08/10/2013 0530   MCH 33.2 08/10/2013 0530   MCHC 34.1 08/10/2013 0530   RDW 18.4* 08/10/2013 0530   LYMPHSABS 1.4 08/08/2013 0525   MONOABS 0.8 08/08/2013 0525   EOSABS 0.0 08/08/2013 0525   BASOSABS 0.0 08/08/2013 0525   CMP    Component Value Date/Time   NA 130* 08/08/2013 0525   K 3.8 08/08/2013 0525   CL 98 08/08/2013 0525    CO2 19 08/08/2013 0525   GLUCOSE 80 08/08/2013 0525   BUN 7 08/08/2013 0525   CREATININE 0.56 08/08/2013 0525   CALCIUM 8.1* 08/08/2013 0525   PROT 6.2 08/08/2013 0525   ALBUMIN 2.9* 08/08/2013 0525   AST 25 08/08/2013 0525   ALT 10 08/08/2013 0525   ALKPHOS 270* 08/08/2013 0525   BILITOT 0.6 08/08/2013 0525   GFRNONAA >90 08/08/2013 0525   GFRAA >90 08/08/2013 0525   COAGS Lab Results  Component Value Date   INR 1.07 08/09/2013   INR 1.00 02/10/2013   Lipid Panel    Component Value Date/Time   CHOL 157 08/07/2013 0545   TRIG 139 08/07/2013 0545   HDL 33* 08/07/2013 0545   CHOLHDL 4.8 08/07/2013 0545   VLDL 28 08/07/2013 0545   LDLCALC 96 08/07/2013 0545   HgbA1C  Lab Results  Component Value Date   HGBA1C 5.5 08/07/2013   Cardiac Panel (last 3 results) No results found for this basename: CKTOTAL, CKMB, TROPONINI, RELINDX,  in the last 72 hours Urinalysis No results found for this basename: colorurine, appearanceur, labspec, phurine, glucoseu, hgbur, bilirubinur, ketonesur, proteinur, urobilinogen, nitrite, leukocytesur   Urine Drug Screen  No results found for this basename: labopia, cocainscrnur, labbenz, amphetmu, thcu, labbarb    Alcohol Level No results found for this basename: eth  SIGNIFICANT DIAGNOSTIC STUDIES Dg Chest 2 View  08/06/2013 1. The appearance of the chest is similar to prior examinations demonstrating no definite acute cardiopulmonary disease, but evidence of primary right upper lobe neoplasm with metastatic disease, as above.   Dg Swallowing Func-speech Pathology  08/07/2013 Lacinda Axon, CCC-SLP 08/07/2013 12:52 PM Objective Swallowing Evaluation: Modified Barium Swallowing Study Patient Details Name: Gabriel [name] MRN: [id number] Date of Birth: January 27, 1941 Today's Date: 08/07/2013 Time:10:30 to 11:00 Past Medical History: Past Medical History Diagnosis Date . Cancer . Hypertension . Glaucoma . Shortness of breath with activity . Stroke  08/06/2013 tpa given . Myocardial infarction 08/06/2013 Past Surgical History: Past Surgical History Procedure Laterality Date . Cataract extraction, bilateral Bilateral 2009 HPI: Gabriel [name] is an 72 y.o. male, right handed, with a past medical history significant for HTN, MI, metastatic lung cancer, transferred to [facility] after receiving IV tpa at [facility] ED earlier today. MBS indicated following results of BSE. Assessment / Plan / Recommendation Clinical Impression Dysphagia Diagnosis: Mild oral phase dysphagia;Moderate pharyngeal phase dysphagia;Mild cervical esophageal phase dysphagia Significant amount of aspiration during and after swallow of thin liquid by spoon and cup. Sensed aspiration but ineffective cough to clear aspirated material. Barium containment in vallecular space s/p swallow of all consistencies. Strategy of double swallow effective in clearing residue. Brief esophageal sweep indicates slow bolus transit with significant amount of containment in distal esophagus. Appeared to be narrowing in esophagus but no radiologist present to confirm. Recommend to proceed with dysphagia 2 ( finely chopped) diet consistency with nectar thick liquids with full supervision with all meals. Diagnostic treatment completed s/p study focusing on providing education to patient and caregiver on diet rec's and necessary swallow strategies to decrease risk for aspiration. ST to follow for diet tolerance and possible advancement. Repeat MBS in 2 to 3 days with clinical improvement prior to advancing liquids if indicated. Treatment Recommendation F/U MBS in ___ days (Comment) (2 to 3 days with clinical improvement) Diet Recommendation Dysphagia 2 (Fine chop);Nectar-thick liquid Liquid Administration via: Cup;No straw Medication Administration: Whole meds with puree Supervision: Patient able to self feed;Full supervision/cueing for compensatory strategies Compensations: Slow rate;Small sips/bites;Follow solids with  liquid;Multiple dry swallows after each bite/sip Postural Changes and/or Swallow Maneuvers: Seated upright 90 degrees;Upright 30-60 min after meal Other Recommendations Recommended Consults: MBS Oral Care Recommendations: Oral care Q4 per protocol Other Recommendations: Order thickener from pharmacy;Prohibited food (jello, ice cream, thin soups);Remove water pitcher;Have oral suction available;Clarify dietary restrictions Follow Up Recommendations Inpatient Rehab Frequency and Duration min 2x/week 2 weeks SLP Swallow Goals General Date of Onset: 08/06/13 HPI: Benjie Ricketson is an 72 y.o. male, right handed, with a past medical history significant for HTN, MI, metastatic lung cancer, transferred to [facility] after receiving IV tpa at [facility] ED earlier today. Type of Study: Modified Barium Swallowing Study Reason for Referral: Objectively evaluate swallowing function Diet Prior to this Study: NPO Temperature Spikes Noted: No Respiratory Status: Room air History of Recent Intubation: No Behavior/Cognition: Alert;Cooperative;Pleasant mood;Distractible;Requires cueing Oral Cavity - Dentition: Missing dentition Oral Motor / Sensory Function: Impaired - see Bedside swallow eval Self-Feeding Abilities: Able to feed self;Needs assist Patient Positioning: Upright in chair Baseline Vocal Quality: Clear Volitional Cough: Strong Volitional Swallow: Able to elicit Anatomy: Within functional limits Pharyngeal Secretions: Not observed secondary MBS Reason for Referral Objectively evaluate swallowing function Oral Phase Oral Preparation/Oral Phase Oral Phase: Impaired Oral - Nectar Oral - Nectar Teaspoon: Weak lingual manipulation;Piecemeal swallowing;Holding of bolus Oral - Nectar Cup:  Holding of bolus;Weak lingual manipulation;Piecemeal swallowing Oral - Thin Oral - Thin Teaspoon: Weak lingual manipulation;Delayed oral transit;Piecemeal swallowing Oral - Thin Cup: Holding of bolus;Weak lingual manipulation;Piecemeal  swallowing;Reduced posterior propulsion;Delayed oral transit Oral - Solids Oral - Puree: Reduced posterior propulsion;Weak lingual manipulation;Piecemeal swallowing;Delayed oral transit Oral - Mechanical Soft: Delayed oral transit;Piecemeal swallowing;Reduced posterior propulsion;Weak lingual manipulation Pharyngeal Phase Pharyngeal Phase Pharyngeal Phase: Impaired Pharyngeal - Nectar Pharyngeal - Nectar Teaspoon: Premature spillage to valleculae;Reduced anterior laryngeal mobility;Reduced airway/laryngeal closure;Reduced tongue base retraction;Pharyngeal residue - valleculae;Reduced pharyngeal peristalsis Pharyngeal - Nectar Cup: Premature spillage to valleculae;Reduced anterior laryngeal mobility;Reduced pharyngeal peristalsis;Reduced laryngeal elevation;Reduced tongue base retraction;Reduced airway/laryngeal closure;Pharyngeal residue - valleculae Pharyngeal - Thin Pharyngeal - Thin Teaspoon: Premature spillage to valleculae;Reduced anterior laryngeal mobility;Reduced laryngeal elevation;Reduced pharyngeal peristalsis;Reduced airway/laryngeal closure;Reduced tongue base retraction;Penetration/Aspiration before swallow;Penetration/Aspiration during swallow;Significant aspiration (Amount);Compensatory strategies attempted (Comment) (chin tuck not effective ) Penetration/Aspiration details (thin teaspoon): Material enters airway, passes BELOW cords and not ejected out despite cough attempt by patient Pharyngeal - Thin Cup: Premature spillage to valleculae;Reduced pharyngeal peristalsis;Reduced anterior laryngeal mobility;Reduced laryngeal elevation;Reduced tongue base retraction;Penetration/Aspiration during swallow;Penetration/Aspiration before swallow;Significant aspiration (Amount) Penetration/Aspiration details (thin cup): Material enters airway, passes BELOW cords and not ejected out despite cough attempt by patient Pharyngeal - Solids Pharyngeal - Puree: Premature spillage to valleculae;Reduced anterior  laryngeal mobility;Reduced pharyngeal peristalsis;Reduced laryngeal elevation;Reduced tongue base retraction;Reduced airway/laryngeal closure Pharyngeal - Mechanical Soft: Premature spillage to valleculae;Reduced pharyngeal peristalsis;Reduced anterior laryngeal mobility;Reduced laryngeal elevation;Reduced airway/laryngeal closure;Reduced tongue base retraction;Pharyngeal residue - valleculae Cervical Esophageal Phase GO Cervical Esophageal Phase Cervical Esophageal Phase: Impaired Cervical Esophageal Phase - Comment Cervical Esophageal Comment: whole barium tablet lodged in distal esophagus requiring multiple dry swallows to complete transit Moreen Fowler MS, CCC-SLP [phone] [facility] 08/07/2013, 12:40 PM   Mr Maxine Glenn Head/brain Wo Cm  08/07/2013 Decrease number of visualized right middle cerebral artery branch vessels consistent with patient's acute infarct. Mild to moderate narrowing distal M1 segment right middle cerebral artery. Intracranial atherosclerotic type changes otherwise as detailed above. Tiny bulge P1 segment right posterior cerebral artery. A vessel appears to arise from this. Tiny aneurysm not entirely excluded. This is at the resolution of the present exam which is slightly motion degraded.   CT of the brain Done at [facility], reported as negative   MRI of the brain Moderate size acute nonhemorrhagic infarct right periopercular region, right subinsular region and right lenticular nucleus and right caudate. Mild local mass effect upon the right lateral ventricle. On the diffusion sequence, there are scattered tiny areas of restricted motion in a relatively symmetric fashion involving portions of the hemisphere bilaterally and cerebellum bilaterally. It is possible findings are related to watershed infarcts or embolic disease. Given the patient's history of lung cancer, tiny intracranial metastatic lesions cannot be completely excluded. Contrast was administered to evaluate this  possibility. Global atrophy without hydrocephalus. Diffuse altered signal intensity of the clivus and upper cervical spine bone marrow. This is new compared to 02/17/2013 exam and raises possibility of diffuse involvement by metastatic disease. Result of anemia is a secondary less likely consideration. Minimal to mild paranasal sinus mucosal thickening.   MRA of the brain Decrease number of visualized right middle cerebral artery branch vessels consistent with patient's acute infarct. Mild to moderate narrowing distal M1 segment right middle cerebral artery. Tiny bulge P1 segment right posterior cerebral artery. A vessel appears to arise from this. Tiny aneurysm not entirely excluded.   2D Echocardiogram EF 60%, wall motion normal, no cardiac source of embolism  identified. LA normal in size.   Carotid Doppler Findings suggest 1-39% right internal carotid artery stenosis and elevated peak systolic velocities of the left proximal internal carotid artery suggestive of 40-59% stenosis, however end diastolic velocities and ICA/CCA ratio suggest upper range 1-39% stenosis. Vertebral arteries are patent with antegrade flow.   CXR  IMPRESSION:  1. The appearance of the chest is similar to prior examinations  demonstrating no definite acute cardiopulmonary disease, but  evidence of primary right upper lobe neoplasm with metastatic  disease, as above.      History of Present Illness    Marquist Binstock is an 72 y.o. male, right handed, with a past medical history significant for HTN, MI, metastatic lung cancer, transferred to [facility] after receiving IV tpa at [facility] ED earlier today.   His states that he was last normal around 8 am on date of admission and when she returned home around 1115 he was on the floor completely weak in the left side The ED physician clarified that the last seen normal was actually around 1130 am today, NIHSS was 5.   According to his wife he was also having slurred speech  and left face weakness. Never had similar symptoms before. Denies HA, vertigo, double vision, difficulty swallowing, confusion. CT brain showed no acute abnormality and thus we decided to treat with IV thrombolysis.  Then, during this time the patient had elevated troponin with normal CK-MB as well as normal EKG and that cardiology was already contacted.   Date last known well: 08/06/13  Time last known well: 8 am  tPA Given: yes  NIHSS: 5  MRS: 3    Hospital Course  Mr. Ariq Khamis is a 72 y.o. male presenting with right brain stroke. Not on antiplatelet medications at home. The patient received TPA at 14:00 on 08/06/2013. Follow up MRI showed  an acute nonhemorrhagic infarct right periopercular region, right subinsular region and right lenticular nucleus and right caudate. The patient also had chest pain and suffered myocardial infarction during this event. Also, the patient has known metastatic lung cancer. Due to thrombocytopenia, baby aspirin has been stopped. 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 Oncology was consulted and recommended that baby aspirin be restarted when platelet counts reach >50,000 and not before. If his platelet levels drop below 10K, he will need platelet transfusion. Until the platelet levels are at appropriate level, the patient will not be on any antithrombotics due to high risk of bleeding.   -Right brain CVA  -Metastatic lung cancer, pain control. Patient's med list shows that he is on fentanyl + at home; however pharmacy has found out that he is only on the dose. I have stopped the fentanyl patch.  -non-STEMI--medical management -Hypertension  -Urinary retention: will keep foley in while heme/onc see's patient. Would want to avoid having to replace indwelling catheter with low platelet count.  -thrombocytopenia, 19K, recommending to follow closely. Patient see's oncology in [place]. Dr. Darnelle Catalan has been following in  the acute care setting  -lipids, 96, on statin  -HGB A1C 5.5, at goal  -risk factor modification  Patient with vascular risk factors of:   Hypertension  Coronary artery disease  peripheral vascular disease  Patient has resultant left hemiparesis. Physical therapy, occupational therapy and speech therapy evaluated patient. All agreed inpatient rehab is needed. Patient's family is/are supportive and can provide care at discharge. CIR bed is available today and patient will be transferred there.  Discharge  Exam  Blood pressure 131/58, pulse 81, temperature 97.5 F (36.4 C), temperature source Oral, resp. rate 18, height 5\' 11"  (1.803 m), weight 65.9 kg (145 lb 4.5 oz), SpO2 100.00%.   Physical Exam  General: The patient is alert and cooperative at the time of the examination.  Respiratory: lung fields are clear  Cardiovascular: There is a regular rhythm, no murmurs  Skin: No significant peripheral edema is noted.  Neurologic Exam Awake alert oriented x 3. Follows commands well  Cranial nerves: Extraocular movements are full. The patient has a left HH.Speech is dysarthric.  Motor: The patient has good strength in the right extremities. There is 4/5 strength in the left arm, and 4+/5 strength of the left leg.  Sensory: soft touch is symmetric on all fours and face.  Coordination: The patient has good finger-nose-finger and heel-to-shin on the right, Dysmetria of the left arm, normal left leg.  Gait and station: The patient was not ambulated, at bed rest   Discharge Diet  Dysphagia 2 Nectar thick   Discharge Plan  Disposition:  Transfer to [facility] for ongoing PT, OT and ST  no antithrombotics (due to low platelet count--see above) for secondary stroke prevention.  Ongoing risk factor control by Primary Care Physician. Risk factor recommendations:  Hypertension target range 130-140/70-80 Lipid range - LDL < 100 and checked every 6 months, fasting Diabetes - HgB  A1C <7   Follow-up primary provider in 1 month.  Keep routine appointments with hematology/oncology as instructed.  Follow-up with Dr. Delia Heady, Stroke Clinic in 2 months.  55 minutes were spent preparing discharge.  Signed Gwendolyn Lima. Manson Passey, [facility], MBA, MHA [facility] Pager: [phone] 08/10/2013 1:19 PM  I have personally examined this patient, reviewed pertinent data and developed the plan of care. I agree with above.   Delia Heady, MD

## 2013-08-10 NOTE — Progress Notes (Signed)
At 1600, patient arrived from [address]. Oriented to rehab, care plan. Safety plan given. Patient indicates mild back pain but no need for medication. To remove Foley catheter. Patient alert, oriented and aware of therapy assessments to be done by therapy and interdisciplinary team planning. Sherlyn Lees, RN

## 2013-08-10 NOTE — H&P (Signed)
Physical Medicine and Rehabilitation Admission H&P  No chief complaint on file.  :  HPI: Gabriel [name] is a 72 y.o. right-handed male with history of hypertension, chronic back pain, metastatic lung cancer(Dr.Shadad) and has been receiving chemotherapy radiation therapy. Patient used a cane and/or a walker prior to admission due to chronic back pain. Admitted to [facility] 08/06/2013 with left-sided weakness and facial droop. Initial cranial CT scan showed no acute abnormalities. TPA was administered. Findings of elevated troponin and normal CK-MB as well as no EKG. Patient was transferred to [facility] for ongoing evaluation. MRI of the brain showed moderate-sized acute nonhemorrhagic infarct right periopercular region, right subinsular region as well as lenticular nucleus and caudate. MRA of the head with atherosclerotic type changes. Carotid Dopplers with left 40-59% ICA stenosis. Cardiology services followup for elevated troponin suspect non-ST elevated myocardial infarction with echocardiogram showing ejection fraction of 60% and grade 1 diastolic dysfunction without emboli . Patient was placed on aspirin for CVA prophylaxis as well as cardiac standpoint. Consultation made to hematology(Dr.Marginat) 08/09/2013 in regard to thrombocytopenia 26,000 and decreased to 19,000 and advised to hold on any antiplatelet agents thus aspirin was held. Noted patient's platelet count ranged from 188-220,000 as of April 2014. Recommendations have been made to transfuse if platelets 10,000 or less and not to initiate antiplatelet agents until counts are 50,000 or higher. It was anticipated recovery of platelet count would take several days. Dysphagia 2 nectar liquids and followup speech therapy. Physical and occupational therapy evaluations completed 08/08/2013 with recommendations of physical medicine rehabilitation consult to consider inpatient rehabilitation services . Patient was felt to be a good  candidate was admitted for comprehensive rehabilitation program  ROS Review of Systems  Respiratory: Positive for shortness of breath.  Musculoskeletal: Positive for back pain.  Psychiatric/Behavioral:  Anxiety  All other systems reviewed and are negative  Past Medical History   Diagnosis  Date   .  Cancer    .  Hypertension    .  Glaucoma    .  Shortness of breath      with activity   .  Stroke  08/06/2013     tpa given   .  NSTEMI (non-ST elevated myocardial infarction)  08/06/2013    Past Surgical History   Procedure  Laterality  Date   .  Cataract extraction, bilateral  Bilateral  2009    Family History   Problem  Relation  Age of Onset   .  Heart failure  Mother    .  Stroke  Father    .  Heart disease  Brother     Social History: reports that he has quit smoking. He does not have any smokeless tobacco history on file. He reports that he does not drink alcohol or use illicit drugs.  Allergies:  Allergies   Allergen  Reactions   .  Hydrocodone  Rash    Medications Prior to Admission   Medication  Sig  Dispense  Refill   .  ALPRAZolam (XANAX) 1 MG tablet  Take 1 tablet by mouth 3 (three) times daily as needed for anxiety.     Marland Kitchen  amLODipine (NORVASC) 10 MG tablet  Take 1 tablet (10 mg total) by mouth daily.  30 tablet  3   .  dexamethasone (DECADRON) 2 MG tablet  Take 2 mg by mouth 2 (two) times daily with a meal.     .  docusate sodium 100 MG CAPS  Take 100 mg  by mouth 2 (two) times daily as needed for constipation.  60 capsule  3   .  fentaNYL (DURAGESIC - DOSED MCG/HR) 100 MCG/HR  Place 1 patch onto the skin every 3 (three) days.     .  fentaNYL (DURAGESIC - DOSED MCG/HR) 25 MCG/HR patch  Place 1 patch onto the skin every 3 (three) days.     .  Flaxseed, Linseed, (FLAX SEED OIL PO)  Take 1 capsule by mouth daily.     Marland Kitchen  HYDROcodone-acetaminophen (NORCO) 10-325 MG per tablet  Take 1 tablet by mouth every 6 (six) hours as needed for pain.     Marland Kitchen  ibuprofen (ADVIL,MOTRIN)  200 MG tablet  Take 400 mg by mouth every 8 (eight) hours as needed for pain.     Marland Kitchen  losartan (COZAAR) 100 MG tablet  Take 1 tablet (100 mg total) by mouth daily.  30 tablet  3   .  Multiple Vitamin (MULTIVITAMIN WITH MINERALS) TABS  Take 1 tablet by mouth daily.  30 tablet  3   .  ondansetron (ZOFRAN) 4 MG tablet  Take 1 tablet (4 mg total) by mouth every 6 (six) hours as needed for nausea.  45 tablet  0   .  timolol (TIMOPTIC) 0.5 % ophthalmic solution  Place 1 drop into both eyes every morning.  15 mL  4    Home:  Home Living  Family/patient expects to be discharged to:: Inpatient rehab  Living Arrangements: Spouse/significant other  Available Help at Discharge: Family  Type of Home: House  Home Access: Stairs to enter  [employer] of Steps: 1  Home Layout: One level  Home Equipment: Walker - 4 wheels;Cane - single point  Additional Comments: pt using mother in laws old rollator  Lives With: Spouse  Functional History:  Prior Function  Vocation: Retired  Comments: Reports uses a RW and/or cane for [employer]  Functional Status:  Mobility:  Bed Mobility  Bed Mobility: Supine to Sit;Sitting - Scoot to [place] of Bed;Sit to Supine;Rolling Right;Right Sidelying to Sit  Rolling Right: 2: Max assist  Right Sidelying to Sit: 2: Max assist;HOB elevated;With rails  Supine to Sit: 2: Max assist;HOB elevated;With rails  Sitting - Scoot to Edge of Bed: 2: Max assist  Sit to Supine: 3: Mod assist;HOB flat  Transfers  Transfers: Sit to Stand;Stand to Sit  Sit to Stand: 3: Mod assist;With upper extremity assist;From bed  Stand to Sit: 3: Mod assist;With upper extremity assist;To bed  Ambulation/Gait  Ambulation/Gait Assistance: Other (comment) (attempted)  Ambulation Distance (Feet): (2 steps)  Assistive device: Rolling walker  Ambulation/Gait Assistance Details: significant loss of balance to left with 1-2 steps, requiring max assist to safely sit to chair on pt's left   ADL:  ADL   Eating/Feeding: Other (comment) (difficulty controlling secretions)  Grooming: Wash/dry face;Min guard  Where Assessed - Grooming: Supported sitting  Toilet Transfer: Maximal assistance  Statistician Method: Sit to Production manager: Raised toilet seat with arms (or 3-in-1 over toilet)  Equipment Used: Gait belt;Rolling walker  Transfers/Ambulation Related to ADLs: pt with strong left lean and unaware of lean to the left. Pt unsafe to ambulate one person (A) at this time due to implusive.  ADL Comments: Pt able to answer questions about PTA housing without deficits. Wife present to confirm. Wife expressed desire to get him the best care possible and she wants him here for rehab. Wife would like [facility] SNF if placement is  required. pt with decr control of oral secretions and secretions leaking from left side of mouth. Wife asking "when will this come back? Our preacher was much faster than this." Pt and wife educated that some of the recovery process has to do with the brain healing and that everyone heals different. Currently there is no set time frame if or when it will come back. Right now patient and wife must wait to see how much spontaneous recovery the body is able to do on its own and in the time here on acute we will work with him on return to PTA level of mobility/ adls. Pt's wife attempting to give thicken liquids at bed side. Pt with coughing s/p apple sauce fed by wife. Pt coughing with bed not fully elevated due to oral secretions.  Cognition:  Cognition  Overall Cognitive Status: Impaired/Different from baseline  Arousal/Alertness: Awake/alert  Orientation Level: Oriented to person;Disoriented to place;Disoriented to time;Disoriented to situation  Attention: Sustained  Sustained Attention: Impaired  Sustained Attention Impairment: Functional basic  Memory: Impaired  Memory Impairment: Storage deficit;Retrieval deficit;Decreased recall of new information   Awareness: Impaired  Awareness Impairment: Intellectual impairment  Problem Solving: Impaired  Problem Solving Impairment: Functional basic;Verbal basic (cues to need to call for assist, concern for awareness to deficits)  Behaviors: Restless  Safety/Judgment: Impaired  Comments: pt states he has the ability to go home and mow today but said he it didn't mean that he would do it, poor awareness to signifcant deficits impacting his participation in rehabiliation  Cognition  Arousal/Alertness: Awake/alert  Behavior During Therapy: [facility] for tasks assessed/performed  Overall Cognitive Status: Impaired/Different from baseline  Area of Impairment: Safety/judgement;Awareness;Problem solving  Memory: Decreased short-term memory  Safety/Judgement: Decreased awareness of deficits;Decreased awareness of safety  Awareness: Anticipatory  Problem Solving: Slow processing;Difficulty sequencing  General Comments: pt impulsive and needs cues to remain at bedside. Pt's wife repeating instructions for safety    Physical Exam:  Blood pressure 136/76, pulse 81, temperature 98 F (36.7 C), temperature source Oral, resp. rate 18, height 5\' 11"  (1.803 m), weight 65.9 kg (145 lb 4.5 oz), SpO2 100.00%.   Constitutional: He is oriented to person, place, and time.  HENT: oral mucosa pink and moist Head: Normocephalic.  Eyes: EOM are normal.  Neck: Normal range of motion. Neck supple. No thyromegaly present.  Cardiovascular: Normal rate and regular rhythm. No murmurs Respiratory: Effort normal and breath sounds normal. No respiratory distress. No wheezes or rales GI: Soft. Bowel sounds are normal. He exhibits no distension.  Musculoskeletal: He exhibits no edema.  Neurological: He is alert and oriented to person, place, and time. He displays normal reflexes.  Speech is mildly dysarthric but fully intelligible. Left facial droop and tongue deviation. LUE is 3+/5 deltoid, triceps, biceps, wrist and HI are grossly  3/5. Marland Kitchen Decreased fmc and +PD on the left. LLE is 3- HF, KE, and 3/5 at the ankle with ADF,APF. Sensory exam intact to PP and LT bilaterally.  Skin: Skin is warm and dry.  Psychiatric: He has a normal mood and affect. His behavior is normal. Judgment and thought content normal    Results for orders placed during the hospital encounter of 08/06/13 (from the past 48 hour(s))   CBC WITH DIFFERENTIAL Status: Abnormal    Collection Time    08/08/13 5:25 AM   Result  Value  Range    WBC  15.0 (*)  4.0 - 10.5 K/uL    RBC  2.86 (*)  4.22 - 5.81 MIL/uL    Hemoglobin  9.2 (*)  13.0 - 17.0 g/dL    HCT  16.1 (*)  09.6 - 52.0 %    MCV  98.6  78.0 - 100.0 fL    MCH  32.2  26.0 - 34.0 pg    MCHC  32.6  30.0 - 36.0 g/dL    RDW  04.5 (*)  40.9 - 15.5 %    Platelets  29 (*)  150 - 400 K/uL    Comment:  PLATELET COUNT CONFIRMED BY SMEAR     CRITICAL RESULT CALLED TO, READ BACK BY AND VERIFIED WITH:     Elie Goody RN AT 8119 08/08/13 BY ZBEECH.    Neutrophils Relative %  85 (*)  43 - 77 %    Neutro Abs  12.8 (*)  1.7 - 7.7 K/uL    Lymphocytes Relative  9 (*)  12 - 46 %    Lymphs Abs  1.4  0.7 - 4.0 K/uL    Monocytes Relative  5  3 - 12 %    Monocytes Absolute  0.8  0.1 - 1.0 K/uL    Eosinophils Relative  0  0 - 5 %    Eosinophils Absolute  0.0  0.0 - 0.7 K/uL    Basophils Relative  0  0 - 1 %    Basophils Absolute  0.0  0.0 - 0.1 K/uL    WBC Morphology  INCREASED BANDS (>20% BANDS)     Comment:  TOXIC GRANULATION     DOHLE BODIES    RBC Morphology  POLYCHROMASIA PRESENT    COMPREHENSIVE METABOLIC PANEL Status: Abnormal    Collection Time    08/08/13 5:25 AM   Result  Value  Range    Sodium  130 (*)  135 - 145 mEq/L    Potassium  3.8  3.5 - 5.1 mEq/L    Chloride  98  96 - 112 mEq/L    CO2  19  19 - 32 mEq/L    Glucose, Bld  80  70 - 99 mg/dL    BUN  7  6 - 23 mg/dL    Creatinine, Ser  1.47  0.50 - 1.35 mg/dL    Calcium  8.1 (*)  8.4 - 10.5 mg/dL    Total Protein  6.2  6.0 - 8.3 g/dL     Albumin  2.9 (*)  3.5 - 5.2 g/dL    AST  25  0 - 37 U/L    ALT  10  0 - 53 U/L    Alkaline Phosphatase  270 (*)  39 - 117 U/L    Total Bilirubin  0.6  0.3 - 1.2 mg/dL    GFR calc non Af Amer  >90  >90 mL/min    GFR calc Af Amer  >90  >90 mL/min    Comment:  (NOTE)     The eGFR has been calculated using the CKD EPI equation.     This calculation has not been validated in all clinical situations.     eGFR's persistently <90 mL/min signify possible Chronic Kidney     Disease.   CBC Status: Abnormal    Collection Time    08/08/13 11:20 AM   Result  Value  Range    WBC  16.4 (*)  4.0 - 10.5 K/uL    RBC  2.98 (*)  4.22 - 5.81 MIL/uL    Hemoglobin  9.7 (*)  13.0 -  17.0 g/dL    HCT  16.1 (*)  09.6 - 52.0 %    MCV  98.7  78.0 - 100.0 fL    MCH  32.6  26.0 - 34.0 pg    MCHC  33.0  30.0 - 36.0 g/dL    RDW  04.5 (*)  40.9 - 15.5 %    Platelets  26 (*)  150 - 400 K/uL    Comment:  CRITICAL VALUE NOTED. VALUE IS CONSISTENT WITH PREVIOUSLY REPORTED AND CALLED VALUE.     REPEATED TO VERIFY   CBC Status: Abnormal    Collection Time    08/09/13 5:20 AM   Result  Value  Range    WBC  12.7 (*)  4.0 - 10.5 K/uL    RBC  2.79 (*)  4.22 - 5.81 MIL/uL    Hemoglobin  9.1 (*)  13.0 - 17.0 g/dL    HCT  81.1 (*)  91.4 - 52.0 %    MCV  98.9  78.0 - 100.0 fL    MCH  32.6  26.0 - 34.0 pg    MCHC  33.0  30.0 - 36.0 g/dL    RDW  78.2 (*)  95.6 - 15.5 %    Platelets  24 (*)  150 - 400 K/uL    Comment:  CRITICAL VALUE NOTED. VALUE IS CONSISTENT WITH PREVIOUSLY REPORTED AND CALLED VALUE.     CONSISTENT WITH PREVIOUS RESULT    Mr Sherrin Daisy Contrast  08/07/2013 CLINICAL DATA: 72 year old hypertensive patient with history of metastatic lung cancer. Presenting with left-sided weakness. Post IV TPA. EXAM: MRI HEAD WITHOUT CONTRAST MRA HEAD WITHOUT CONTRAST TECHNIQUE: Multiplanar, multiecho pulse sequences of the brain and surrounding structures were obtained without intravenous contrast. Angiographic images of the  head were obtained using MRA technique without contrast. COMPARISON: 08/06/2013 CT. 02/17/2013 MR. FINDINGS: MRI HEAD FINDINGS Exam is motion degraded. Moderate size acute nonhemorrhagic infarct right periopercular region, right subinsular region and right lenticular nucleus and right caudate. Mild local mass effect upon the right lateral ventricle. Tiny area blood breakdown products left cerebellum probably related to remote hemorrhagic ischemia. No other intracranial hemorrhage noted. On the diffusion sequence, there are scattered tiny areas of restricted motion in a relatively symmetric fashion involving portions of the hemisphere bilaterally and cerebellum bilaterally. It is possible findings are related to watershed infarcts or embolic disease. Given the patient's history of lung cancer, tiny intracranial metastatic lesions Global atrophy without hydrocephalus. Diffuse altered signal intensity of the clivus and upper cervical spine bone marrow. This is new compared to 02/17/2013 exam and raises possibility of diffuse involvement by metastatic disease. Result of anemia is a secondary less likely consideration. Minimal to mild paranasal sinus mucosal thickening. MRA HEAD FINDINGS Mild narrowing super clinoid aspect of the internal carotid artery bilaterally. Decrease number of visualized right middle cerebral artery branch vessels consistent with patient's acute infarct. Mild to moderate narrowing distal M1 segment right middle cerebral artery. Moderate narrowing A1 segment left anterior cerebral artery. Left middle cerebral artery branch vessel irregularity. Mild narrowing distal right vertebral artery. Non visualized right anterior inferior cerebellar artery. Narrowed irregular left posterior inferior cerebellar artery and left anterior inferior cerebellar artery. No high-grade stenosis of the basilar artery. Mild irregularity and narrowing of portions of the superior cerebellar artery and posterior cerebral  artery bilaterally. Tiny bulge P1 segment right posterior cerebral artery. A vessel appears to arise from this. Tiny aneurysm not entirely excluded. This is at the resolution of  the present exam which is slightly motion degraded. IMPRESSION: MRI HEAD IMPRESSION Moderate size acute nonhemorrhagic infarct right periopercular region, right subinsular region and right lenticular nucleus and right caudate. Mild local mass effect upon the right lateral ventricle. On the diffusion sequence, there are scattered tiny areas of restricted motion in a relatively symmetric fashion involving portions of the hemisphere bilaterally and cerebellum bilaterally. It is possible findings are related to watershed infarcts or embolic disease. Given the patient's history of lung cancer, tiny intracranial metastatic lesions cannot be completely excluded. Contrast was administered to evaluate this possibility. Global atrophy without hydrocephalus. Diffuse altered signal intensity of the clivus and upper cervical spine bone marrow. This is new compared to 02/17/2013 exam and raises possibility of diffuse involvement by metastatic disease. Result of anemia is a secondary less likely consideration. Minimal to mild paranasal sinus mucosal thickening. MRA HEAD IMPRESSION Decrease number of visualized right middle cerebral artery branch vessels consistent with patient's acute infarct. Mild to moderate narrowing distal M1 segment right middle cerebral artery. Intracranial atherosclerotic type changes otherwise as detailed above. Tiny bulge P1 segment right posterior cerebral artery. A vessel appears to arise from this. Tiny aneurysm not entirely excluded. This is at the resolution of the present exam which is slightly motion degraded. These results were called by telephone at the time of interpretation on 08/07/2013 at 1:43 PM to Tammy the pateients nurse, who verbally acknowledged these results. Electronically Signed By: Bridgett Larsson M.D. On:  08/07/2013 13:49  Dg Swallowing Func-speech Pathology  08/07/2013 Lacinda Axon, CCC-SLP 08/07/2013 12:52 PM Objective Swallowing Evaluation: Modified Barium Swallowing Study Patient Details Name: Ashlin Hidalgo MRN: [id number] Date of Birth: [birth year] Today's Date: 08/07/2013 Time:10:30 to 11:00 Past Medical History: Past Medical History Diagnosis Date . Cancer . Hypertension . Glaucoma . Shortness of breath with activity . Stroke 08/06/2013 tpa given . Myocardial infarction 08/06/2013 Past Surgical History: Past Surgical History Procedure Laterality Date . Cataract extraction, bilateral Bilateral 2009 HPI: Keniel Ralston is an 72 y.o. male, right handed, with a past medical history significant for HTN, MI, metastatic lung cancer, transferred to [facility] after receiving IV tpa at [facility] ED earlier today. MBS indicated following results of BSE. Assessment / Plan / Recommendation Clinical Impression Dysphagia Diagnosis: Mild oral phase dysphagia;Moderate pharyngeal phase dysphagia;Mild cervical esophageal phase dysphagia Significant amount of aspiration during and after swallow of thin liquid by spoon and cup. Sensed aspiration but ineffective cough to clear aspirated material. Barium containment in vallecular space s/p swallow of all consistencies. Strategy of double swallow effective in clearing residue. Brief esophageal sweep indicates slow bolus transit with significant amount of containment in distal esophagus. Appeared to be narrowing in esophagus but no radiologist present to confirm. Recommend to proceed with dysphagia 2 ( finely chopped) diet consistency with nectar thick liquids with full supervision with all meals. Diagnostic treatment completed s/p study focusing on providing education to patient and caregiver on diet rec's and necessary swallow strategies to decrease risk for aspiration. ST to follow for diet tolerance and possible advancement. Repeat MBS in 2 to 3 days with clinical improvement  prior to advancing liquids if indicated. Treatment Recommendation F/U MBS in ___ days (Comment) (2 to 3 days with clinical improvement) Diet Recommendation Dysphagia 2 (Fine chop);Nectar-thick liquid Liquid Administration via: Cup;No straw Medication Administration: Whole meds with puree Supervision: Patient able to self feed;Full supervision/cueing for compensatory strategies Compensations: Slow rate;Small sips/bites;Follow solids with liquid;Multiple dry swallows after each bite/sip Postural Changes and/or Swallow Maneuvers: Seated  upright 90 degrees;Upright 30-60 min after meal Other Recommendations Recommended Consults: MBS Oral Care Recommendations: Oral care Q4 per protocol Other Recommendations: Order thickener from pharmacy;Prohibited food (jello, ice cream, thin soups);Remove water pitcher;Have oral suction available;Clarify dietary restrictions Follow Up Recommendations Inpatient Rehab Frequency and Duration min 2x/week 2 weeks SLP Swallow Goals General Date of Onset: 08/06/13 HPI: Jyquan Kenley is an 72 y.o. male, right handed, with a past medical history significant for HTN, MI, metastatic lung cancer, transferred to [facility] after receiving IV tpa at [facility] ED earlier today. Type of Study: Modified Barium Swallowing Study Reason for Referral: Objectively evaluate swallowing function Diet Prior to this Study: NPO Temperature Spikes Noted: No Respiratory Status: Room air History of Recent Intubation: No Behavior/Cognition: Alert;Cooperative;Pleasant mood;Distractible;Requires cueing Oral Cavity - Dentition: Missing dentition Oral Motor / Sensory Function: Impaired - see Bedside swallow eval Self-Feeding Abilities: Able to feed self;Needs assist Patient Positioning: Upright in chair Baseline Vocal Quality: Clear Volitional Cough: Strong Volitional Swallow: Able to elicit Anatomy: Within functional limits Pharyngeal Secretions: Not observed secondary MBS Reason for Referral Objectively evaluate  swallowing function Oral Phase Oral Preparation/Oral Phase Oral Phase: Impaired Oral - Nectar Oral - Nectar Teaspoon: Weak lingual manipulation;Piecemeal swallowing;Holding of bolus Oral - Nectar Cup: Holding of bolus;Weak lingual manipulation;Piecemeal swallowing Oral - Thin Oral - Thin Teaspoon: Weak lingual manipulation;Delayed oral transit;Piecemeal swallowing Oral - Thin Cup: Holding of bolus;Weak lingual manipulation;Piecemeal swallowing;Reduced posterior propulsion;Delayed oral transit Oral - Solids Oral - Puree: Reduced posterior propulsion;Weak lingual manipulation;Piecemeal swallowing;Delayed oral transit Oral - Mechanical Soft: Delayed oral transit;Piecemeal swallowing;Reduced posterior propulsion;Weak lingual manipulation Pharyngeal Phase Pharyngeal Phase Pharyngeal Phase: Impaired Pharyngeal - Nectar Pharyngeal - Nectar Teaspoon: Premature spillage to valleculae;Reduced anterior laryngeal mobility;Reduced airway/laryngeal closure;Reduced tongue base retraction;Pharyngeal residue - valleculae;Reduced pharyngeal peristalsis Pharyngeal - Nectar Cup: Premature spillage to valleculae;Reduced anterior laryngeal mobility;Reduced pharyngeal peristalsis;Reduced laryngeal elevation;Reduced tongue base retraction;Reduced airway/laryngeal closure;Pharyngeal residue - valleculae Pharyngeal - Thin Pharyngeal - Thin Teaspoon: Premature spillage to valleculae;Reduced anterior laryngeal mobility;Reduced laryngeal elevation;Reduced pharyngeal peristalsis;Reduced airway/laryngeal closure;Reduced tongue base retraction;Penetration/Aspiration before swallow;Penetration/Aspiration during swallow;Significant aspiration (Amount);Compensatory strategies attempted (Comment) (chin tuck not effective ) Penetration/Aspiration details (thin teaspoon): Material enters airway, passes BELOW cords and not ejected out despite cough attempt by patient Pharyngeal - Thin Cup: Premature spillage to valleculae;Reduced pharyngeal  peristalsis;Reduced anterior laryngeal mobility;Reduced laryngeal elevation;Reduced tongue base retraction;Penetration/Aspiration during swallow;Penetration/Aspiration before swallow;Significant aspiration (Amount) Penetration/Aspiration details (thin cup): Material enters airway, passes BELOW cords and not ejected out despite cough attempt by patient Pharyngeal - Solids Pharyngeal - Puree: Premature spillage to valleculae;Reduced anterior laryngeal mobility;Reduced pharyngeal peristalsis;Reduced laryngeal elevation;Reduced tongue base retraction;Reduced airway/laryngeal closure Pharyngeal - Mechanical Soft: Premature spillage to valleculae;Reduced pharyngeal peristalsis;Reduced anterior laryngeal mobility;Reduced laryngeal elevation;Reduced airway/laryngeal closure;Reduced tongue base retraction;Pharyngeal residue - valleculae Cervical Esophageal Phase GO Cervical Esophageal Phase Cervical Esophageal Phase: Impaired Cervical Esophageal Phase - Comment Cervical Esophageal Comment: whole barium tablet lodged in distal esophagus requiring multiple dry swallows to complete transit Moreen Fowler MS, CCC-SLP [phone] [facility] 08/07/2013, 12:40 PM  Mr Maxine Glenn Head/brain Wo Cm  08/07/2013 CLINICAL DATA: 72 year old hypertensive patient with history of metastatic lung cancer. Presenting with left-sided weakness. Post IV TPA. EXAM: MRI HEAD WITHOUT CONTRAST MRA HEAD WITHOUT CONTRAST TECHNIQUE: Multiplanar, multiecho pulse sequences of the brain and surrounding structures were obtained without intravenous contrast. Angiographic images of the head were obtained using MRA technique without contrast. COMPARISON: 08/06/2013 CT. 02/17/2013 MR. FINDINGS: MRI HEAD FINDINGS Exam is motion degraded. Moderate size acute nonhemorrhagic infarct right periopercular region, right  subinsular region and right lenticular nucleus and right caudate. Mild local mass effect upon the right lateral ventricle. Tiny area blood breakdown products  left cerebellum probably related to remote hemorrhagic ischemia. No other intracranial hemorrhage noted. On the diffusion sequence, there are scattered tiny areas of restricted motion in a relatively symmetric fashion involving portions of the hemisphere bilaterally and cerebellum bilaterally. It is possible findings are related to watershed infarcts or embolic disease. Given the patient's history of lung cancer, tiny intracranial metastatic lesions Global atrophy without hydrocephalus. Diffuse altered signal intensity of the clivus and upper cervical spine bone marrow. This is new compared to 02/17/2013 exam and raises possibility of diffuse involvement by metastatic disease. Result of anemia is a secondary less likely consideration. Minimal to mild paranasal sinus mucosal thickening. MRA HEAD FINDINGS Mild narrowing super clinoid aspect of the internal carotid artery bilaterally. Decrease number of visualized right middle cerebral artery branch vessels consistent with patient's acute infarct. Mild to moderate narrowing distal M1 segment right middle cerebral artery. Moderate narrowing A1 segment left anterior cerebral artery. Left middle cerebral artery branch vessel irregularity. Mild narrowing distal right vertebral artery. Non visualized right anterior inferior cerebellar artery. Narrowed irregular left posterior inferior cerebellar artery and left anterior inferior cerebellar artery. No high-grade stenosis of the basilar artery. Mild irregularity and narrowing of portions of the superior cerebellar artery and posterior cerebral artery bilaterally. Tiny bulge P1 segment right posterior cerebral artery. A vessel appears to arise from this. Tiny aneurysm not entirely excluded. This is at the resolution of the present exam which is slightly motion degraded. 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 Mild local mass effect upon the right lateral ventricle. On the diffusion sequence, there are scattered tiny areas of restricted motion in a relatively symmetric fashion involving portions of the hemisphere bilaterally and cerebellum bilaterally. It is possible findings are related to watershed infarcts or embolic disease. Given the patient's history of lung cancer, tiny intracranial metastatic lesions cannot be completely excluded. Contrast was administered to evaluate this possibility. Global atrophy without hydrocephalus. Diffuse altered signal intensity of the clivus and upper cervical spine bone marrow. This is new compared to 02/17/2013 exam and raises possibility of diffuse involvement by metastatic disease. Result of anemia is a secondary less likely consideration. Minimal to mild paranasal sinus mucosal thickening. MRA HEAD IMPRESSION Decrease number of visualized right middle cerebral artery branch vessels consistent with patient's acute infarct. Mild to moderate narrowing distal M1 segment right middle cerebral artery. Intracranial atherosclerotic type changes otherwise as detailed above. Tiny bulge P1 segment right posterior cerebral artery. A vessel appears to arise from this. Tiny aneurysm not entirely excluded. This is at the resolution of the present exam which is slightly motion degraded. These results were called by telephone at the time of interpretation on 08/07/2013 at 1:43 PM to Tammy the pateients nurse, who verbally acknowledged these results. Electronically Signed By: Bridgett Larsson M.D. On: 08/07/2013 13:49   Post Admission Physician Evaluation:  1. Functional deficits secondary to thrombotic right periopercular region, spelled insular region and lenticular nucleus and right caudate infarct with left hemiparesis  . 2. Patient is admitted to receive collaborative, interdisciplinary care between the physiatrist, rehab nursing staff, and therapy team. 3. Patient's level of  medical complexity and substantial therapy needs in context of that medical necessity cannot be provided at a lesser intensity of care such as a SNF. 4.  Patient has experienced substantial functional loss from his/her baseline which was documented above under the "Functional History" and "Functional Status" headings. Judging by the patient's diagnosis, physical exam, and functional history, the patient has potential for functional progress which will result in measurable gains while on inpatient rehab. These gains will be of substantial and practical use upon discharge in facilitating mobility and self-care at the household level. 5. Physiatrist will provide 24 hour management of medical needs as well as oversight of the therapy plan/treatment and provide guidance as appropriate regarding the interaction of the two. 6. 24 hour rehab nursing will assist with bladder management, bowel management, safety, skin/wound care, disease management, medication administration, pain management and patient education and help integrate therapy concepts, techniques,education, etc. 7. PT will assess and treat for/with: Lower extremity strength, range of motion, stamina, balance, functional mobility, safety, adaptive techniques and equipment, NMR, pain mgt, education . Goals are: supervision to minimal assist. 8. OT will assess and treat for/with: ADL's, functional mobility, safety, upper extremity strength, adaptive techniques and equipment, NMR, pain mgt, education. Goals are: supervision to minimal assist. 9. SLP will assess and treat for/with: speech, cognition, swallowing. Goals are: supervision to mod I. 10. Case Management and Social Worker will assess and treat for psychological issues and discharge planning. 11. Team conference will be held weekly to assess progress toward goals and to determine barriers to discharge. 12. Patient will receive at least 3 hours of therapy per day at least 5 days per week. 13. ELOS:  2-3 weeks  14. Prognosis: excellent   Medical Problem List and Plan:  1. Thrombotic right periopercular region, spelled insular region and lenticular nucleus and right caudate infarct with left hemiparesis  2. DVT Prophylaxis/Anticoagulation: SCDs. Monitor for any signs of DVT  3. Pain Management/chronic back pain related to metastatic lung cancer: Duragesic patch as prior to admission. Monitor with increased mobility, norco 10/325 for breakthrough pain as well 4. Neuropsych: This patient is capable of making decisions on his own behalf.  5. Dysphagia. Dysphagia 2 nectar thick liquids. Monitor for any signs of aspiration. Followup speech therapy  6. Non-ST elevated myocardial infarction. Continue aspirin therapy. Followup cardiology services as needed. Low-dose beta blocker has been added  7. Thrombocytopenia. Aspirin has been held and her platelet count 50,000 higher. Followup CBC. Hematology services continues to follow  8. Metastatic lung cancer. Followup oncology services Dr. Clelia Croft and radiation oncology Dr. Kathrynn Running. Patient received his first radiation treatment 02/14/2013 in addition to 3 cycle of chemotherapy last given 08/04/2013  9. Hyperlipidemia. Lipitor    Ranelle Oyster, MD, [facility] Physical Medicine & Rehabilitation   08/09/2013

## 2013-08-10 NOTE — PMR Pre-admission (Signed)
PMR Admission Coordinator Pre-Admission Assessment  Patient: Gabriel [name] is an 72 y.o., male MRN: [id number] DOB: [birth year] Height: 5\' 11"  (180.3 cm) Weight: 65.9 kg (145 lb 4.5 oz)              Insurance Information HMO: yes    PPO:     PCP:      IPA:      80/20:      OTHER: Private fee for service PRIMARY: AARP Medicare complete      Policy#: [id number]      Subscriber: pt CM Name: no auth needed per Oretha Milch and customer service for PFF[employer] pts      Phone#: [phone]     Fax#: n/a Pre-Cert#: not required      Employer: retired Benefits:  Phone #: [phone]     Name: 10/14 [facility]. Date: 10/27/12     Deduct: none      Out of Pocket Max: [phone] met      Life Max: unlimited CIR: $395 per day days 1 - 4 until OOP met which he has to date      SNF: $25 per day days 1-20; $152 per day days 21-61; no copay days 62 plus Outpatient: $50 per visit     No visit limit Home Health: 100%      Co-Pay: no visit limit DME: 80%     Co-Pay: 20% Providers: in network  SECONDARY: none        Emergency Contact Information Contact Information   Name Relation Home Work Mobile   Yano,Marie Spouse [phone]  [phone]   Misty Stanley    [phone]     Current Medical History  Patient Admitting Diagnosis:Right brain infarct with left hemiparesis  History of Present Illness: Gabriel [name] is a 72 y.o. right-handed male with history of hypertension, chronic back pain, metastatic lung cancer and has been receiving chemotherapy radiation therapy. Patient used a cane and/or a walker prior to admission due to chronic back pain.   Admitted to [facility] 08/06/2013 with left-sided weakness and facial droop. Initial cranial CT scan showed no acute abnormalities. TPA was administered. Findings of elevated troponin and normal CK-MB as well as no EKG. Patient was transferred to [facility] for ongoing evaluation. MRI of the brain showed moderate-sized acute nonhemorrhagic infarct right  periopercular region, right subinsular region as well as lenticular nucleus and caudate. MRA of the head with atherosclerotic type changes. Carotid Dopplers with left 40-59% ICA stenosis. Cardiology services followup for elevated troponin suspect non-ST elevated myocardial infarction with echocardiogram pending. Patient is currently maintained on aspirin therapy. Dysphagia 2 nectar liquids and followup speech therapy.  Oncolocy follow up with Dr. Darnelle Catalan with recent history of lung CA with mets 4/14. On chemotherapy cycle 3 of 4 planned chemotherapy cycles.Oncology following for anti-platelet agents.recommend not start ASA or other antiplatelet agents until platelet count rises above 50 K without transfusion.    Total: 7 NIH    Past Medical History  Past Medical History  Diagnosis Date  . Cancer   . Hypertension   . Glaucoma   . Shortness of breath     with activity  . Stroke 08/06/2013    tpa given  . NSTEMI (non-ST elevated myocardial infarction) 08/06/2013  . Thrombocytopenia 08/10/2013    Family History  family history includes Heart disease in his brother; Heart failure in his mother; Stroke in his father.  Prior Rehab/Hospitalizations: none   Current Medications  Current facility-administered medications:0.9 %  sodium  chloride infusion, , Intravenous, Continuous, Wyatt Portela, MD, Last Rate: 75 mL/hr at 08/10/13 0419, 1,000 mL at 08/10/13 0419;  acetaminophen (TYLENOL) suppository 650 mg, 650 mg, Rectal, Q4H PRN, Wyatt Portela, MD;  acetaminophen (TYLENOL) tablet 650 mg, 650 mg, Oral, Q4H PRN, Wyatt Portela, MD, 650 mg at 08/09/13 1910 atorvastatin (LIPITOR) tablet 80 mg, 80 mg, Oral, q1800, Duke Salvia, MD, 80 mg at 08/09/13 1858;  fentaNYL (DURAGESIC - dosed mcg/hr) patch 25 mcg, 25 mcg, Transdermal, Q72H, Cathlyn Parsons, PA-C, 25 mcg at 08/08/13 1356;  labetalol (NORMODYNE,TRANDATE) injection 10 mg, 10 mg, Intravenous, Q10 min PRN, Wyatt Portela, MD;  loratadine  (CLARITIN) tablet 10 mg, 10 mg, Oral, Daily, Wyatt Portela, MD, 10 mg at 08/09/13 1041 metoprolol tartrate (LOPRESSOR) tablet 12.5 mg, 12.5 mg, Oral, BID, Lewayne Bunting, MD, 12.5 mg at 08/09/13 2242;  pantoprazole (PROTONIX) EC tablet 40 mg, 40 mg, Oral, QHS, Wyatt Portela, MD, 40 mg at 08/09/13 2207;  RESOURCE THICKENUP CLEAR, , Oral, PRN, Wyatt Portela, MD;  senna-docusate (Senokot-S) tablet 1 tablet, 1 tablet, Oral, QHS PRN, Wyatt Portela, MD timolol (TIMOPTIC) 0.5 % ophthalmic solution 1 drop, 1 drop, Both Eyes, Daily, Wyatt Portela, MD, 1 drop at 08/09/13 1042  Patients Current Diet: Dysphagia 2 diet with nectar thick liquids  Precautions / Restrictions Precautions Precautions: Fall   Prior Activity Level Community (5-7x/wk): Very active and Mod I with cane since 4/14 Ca dx  Home Assistive Devices / Equipment Home Assistive Devices/Equipment: Gilmer Mor (specify quad or straight);Walker (specify type) Home Equipment: Walker - 4 wheels;Cane - single point  Prior Functional Level Prior Function Level of Independence: Independent with assistive device(s) Comments: Reports uses a RW and/or cane for amb  Current Functional Level Cognition  Arousal/Alertness: Awake/alert Overall Cognitive Status: Impaired/Different from baseline Orientation Level: Oriented to person;Oriented to place;Oriented to time;Oriented to situation Safety/Judgement: Decreased awareness of deficits;Decreased awareness of safety General Comments: pt impulsive and needs cues to remain at bedside. Pt's wife repeating instructions for safety Attention: Sustained Sustained Attention: Impaired Sustained Attention Impairment: Functional basic Memory: Impaired Memory Impairment: Storage deficit;Retrieval deficit;Decreased recall of new information Awareness: Impaired Awareness Impairment: Intellectual impairment Problem Solving: Impaired Problem Solving Impairment: Functional basic;Verbal basic (cues to need to  call for assist, concern for awareness to deficits) Behaviors: Restless Safety/Judgment: Impaired Comments: pt states he has the ability to go home and mow today but said he it didn't mean that he would do it, poor awareness to signifcant deficits impacting his participation in rehabiliation    Extremity Assessment (includes Sensation/Coordination)          ADLs  Eating/Feeding: Other (comment) (difficulty controlling secretions) Grooming: Wash/dry face;Min guard Where Assessed - Grooming: Supported sitting Toilet Transfer: Maximal assistance Statistician Method: Sit to Barista: Raised toilet seat with arms (or 3-in-1 over toilet) Toileting - Clothing Manipulation and Hygiene: +1 Total assistance Where Assessed - Toileting Clothing Manipulation and Hygiene: Sit to stand from 3-in-1 or toilet Equipment Used: Gait belt;Rolling walker Transfers/Ambulation Related to ADLs: pt with strong left lean and unaware of lean to the left. Pt unsafe to ambulate one person (A) at this time due to implusive.  ADL Comments: Pt able to answer questions about PTA housing without deficits. Wife present to confirm. Wife expressed desire to get him the best care possible and she wants him here for rehab. Wife would like [facility] SN[facility] if placement is required. pt with decr control of oral secretions and secretions leaking from left side  of mouth. Wife asking "when will this come back? Our preacher was much faster than this." Pt and wife educated that some of the recovery process has to do with the brain healing and that everyone heals different. Currently there is no set time frame if or when it will come back. Right now patient and wife must wait to see how much spontaneous recovery the body is able to do on its own and in the time here on acute we will work with him on return to PTA level of mobility/ adls. Pt's wife attempting to give thicken liquids at bed side. Pt with coughing s/p apple  sauce fed by wife. Pt coughing with bed not fully elevated due to oral secretions.     Mobility  Bed Mobility: Supine to Sit;Sitting - Scoot to [place] of Bed;Sit to Supine;Rolling Right;Right Sidelying to Sit Rolling Right: 2: Max assist Right Sidelying to Sit: 2: Max assist;HOB elevated;With rails Supine to Sit: 2: Max assist;HOB elevated;With rails Sitting - Scoot to Edge of Bed: 3: Mod assist Sit to Supine: 3: Mod assist;HOB flat    Transfers  Transfers: Sit to Stand;Stand to Sit Sit to Stand: 3: Mod assist;With upper extremity assist;From bed Stand to Sit: 3: Mod assist;With upper extremity assist;To chair/3-in-1    Ambulation / Gait / Stairs / Wheelchair Mobility  Ambulation/Gait Ambulation/Gait Assistance: 1: +2 Total assist Ambulation/Gait: Patient Percentage: 60% Ambulation Distance (Feet): 4 Feet Assistive device: 2 person hand held assist Ambulation/Gait Assistance Details: Patient with very heavy L lean and A to advance LLE. Patient ataxic with foot placement.  Gait Pattern: Ataxic    Posture / Balance Static Sitting Balance Static Sitting - Balance Support: Feet supported;No upper extremity supported Static Sitting - Level of Assistance: 3: Mod assist Static Sitting - Comment/# of Minutes: Conitnues to need assistance for midline posture. L lateral lean with sitting. Increased with fatigue    Special needs/care consideration Bowel mgmt: continent Bladder mgmt:foley    Previous Home Environment Living Arrangements: Spouse/significant other;Other (Comment) (married for 3 weeks, together 6 yrs)  Lives With: Spouse Available Help at Discharge: Family Type of Home: House Home Layout: One level Home Access: Stairs to enter Secretary/administrator of Steps: 1 Bathroom Shower/Tub: Forensic scientist: Handicapped height Bathroom Accessibility: Yes How Accessible: Accessible via walker Home Care Services: No Additional Comments: pt using mother in  laws old rollator   Discharge Living Setting Plans for Discharge Living Setting: Patient's home;Lives with (comment);Other (Comment) (spouse) Type of Home at Discharge: House Discharge Home Layout: One level Discharge Home Access: Stairs to enter Entrance Stairs-Rails: None Entrance Stairs-Number of Steps: 1 step Discharge Bathroom Shower/Tub: Tub/shower unit;Curtain Discharge Bathroom Toilet: Handicapped height Discharge Bathroom Accessibility: Yes How Accessible: Accessible via walker Does the patient have any problems obtaining your medications?: No  Social/Family/Support Systems Patient Roles: Spouse;Parent Contact Information: Abbie Jablon, wife and her daughter, Misty Stanley Anticipated Caregiver: wife and family Anticipated Caregiver's Contact Information: see above Ability/Limitations of Caregiver: wife works 7 am until 12 noon Caregiver Availability: Other (Comment) (wife to discuss with pt's brother, Genevie Cheshire, to provide S) Discharge Plan Discussed with Primary Caregiver: Yes Is Caregiver In Agreement with Plan?: Yes Does Caregiver/Family have Issues with Lodging/Transportation while Pt is in Rehab?: No    Goals/Additional Needs Patient/Family Goal for Rehab: supervision to MOd I with PT, OT, and SLP Expected length of stay: ELOS 10 to 14 days Dietary Needs: Dysphagia 2 diet with nectar thick liquids Additional Information: Magrinat with oncolocy following  in house due to plts, cancer issues Pt/Family Agrees to Admission and willing to participate: Yes Program Orientation Provided & Reviewed with Pt/Caregiver Including Roles  & Responsibilities: Yes   Decrease burden of Care through IP rehab admission: n/a  Possible need for SNF placement upon discharge: not anticipated  Patient Condition: This patient's medical and functional status has changed since the consult dated: 08/08/13 in which the Rehabilitation Physician determined and documented that the patient's condition is  appropriate for intensive rehabilitative care in an inpatient rehabilitation facility. See "History of Present Illness" (above) for medical update. Functional changes are: overall max assist for transfers and 3 steps to chair mod assist. Patient's medical and functional status update has been discussed with the Rehabilitation physician and patient remains appropriate for inpatient rehabilitation. Will admit to inpatient rehab today.  Preadmission Screen Completed By:  Clois Dupes, 08/10/2013 10:12 AM ______________________________________________________________________   Discussed status with Dr. Riley Kill on 08/10/13 at  1012 and received telephone approval for admission today.  Admission Coordinator:  Clois Dupes, time [id number] Date 08/10/13.

## 2013-08-11 ENCOUNTER — Inpatient Hospital Stay (HOSPITAL_COMMUNITY): Payer: Medicare Other | Admitting: Occupational Therapy

## 2013-08-11 ENCOUNTER — Inpatient Hospital Stay (HOSPITAL_COMMUNITY): Payer: Medicare Other | Admitting: Speech Pathology

## 2013-08-11 ENCOUNTER — Inpatient Hospital Stay (HOSPITAL_COMMUNITY): Payer: Medicare Other | Admitting: Rehabilitation

## 2013-08-11 DIAGNOSIS — I6340 Cerebral infarction due to embolism of unspecified cerebral artery: Secondary | ICD-10-CM

## 2013-08-11 DIAGNOSIS — I639 Cerebral infarction, unspecified: Secondary | ICD-10-CM

## 2013-08-11 DIAGNOSIS — G8110 Spastic hemiplegia affecting unspecified side: Secondary | ICD-10-CM

## 2013-08-11 LAB — CBC WITH DIFFERENTIAL/PLATELET
Basophils Absolute: 0 10*3/uL (ref 0.0–0.1)
Basophils Relative: 0 % (ref 0–1)
Eosinophils Absolute: 0 10*3/uL (ref 0.0–0.7)
Hemoglobin: 8.5 g/dL — ABNORMAL LOW (ref 13.0–17.0)
Lymphs Abs: 1.1 10*3/uL (ref 0.7–4.0)
MCH: 32.7 pg (ref 26.0–34.0)
Monocytes Relative: 7 % (ref 3–12)
Neutro Abs: 7.6 10*3/uL (ref 1.7–7.7)
Neutrophils Relative %: 81 % — ABNORMAL HIGH (ref 43–77)
Platelets: 17 10*3/uL — CL (ref 150–400)
RBC: 2.6 MIL/uL — ABNORMAL LOW (ref 4.22–5.81)
RDW: 18.4 % — ABNORMAL HIGH (ref 11.5–15.5)

## 2013-08-11 LAB — COMPREHENSIVE METABOLIC PANEL
ALT: 7 U/L (ref 0–53)
Albumin: 2.5 g/dL — ABNORMAL LOW (ref 3.5–5.2)
Alkaline Phosphatase: 83 U/L (ref 39–117)
Calcium: 7.8 mg/dL — ABNORMAL LOW (ref 8.4–10.5)
Chloride: 101 mEq/L (ref 96–112)
GFR calc Af Amer: >90 mL/min (ref >90)
Glucose, Bld: 107 mg/dL — ABNORMAL HIGH (ref 70–99)
Potassium: 4 mEq/L (ref 3.5–5.1)
Sodium: 132 mEq/L — ABNORMAL LOW (ref 135–145)
Total Bilirubin: 0.4 mg/dL (ref 0.3–1.2)
Total Protein: 5.6 g/dL — ABNORMAL LOW (ref 6.0–8.3)

## 2013-08-11 MED ORDER — CALCIUM CARBONATE-VITAMIN D 500-200 MG-UNIT PO TABS
1.0000 | ORAL_TABLET | Freq: Every day | ORAL | Status: DC
  Administered 2013-08-12 – 2013-08-24 (×13): 1 via ORAL
  Filled 2013-08-11 (×15): qty 1

## 2013-08-11 MED ORDER — VITAMIN C 500 MG PO TABS
500.0000 mg | ORAL_TABLET | Freq: Every day | ORAL | Status: DC
  Administered 2013-08-11 – 2013-08-24 (×14): 500 mg via ORAL
  Filled 2013-08-11 (×16): qty 1

## 2013-08-11 MED ORDER — FLUTICASONE PROPIONATE 50 MCG/ACT NA SUSP
1.0000 | Freq: Every day | NASAL | Status: DC
  Administered 2013-08-11 – 2013-08-24 (×14): 1 via NASAL
  Filled 2013-08-11: qty 16

## 2013-08-11 MED ORDER — ADULT MULTIVITAMIN W/MINERALS CH
1.0000 | ORAL_TABLET | Freq: Every day | ORAL | Status: DC
  Administered 2013-08-11 – 2013-08-24 (×13): 1 via ORAL
  Filled 2013-08-11 (×16): qty 1

## 2013-08-11 MED ORDER — ENSURE COMPLETE PO LIQD
237.0000 mL | Freq: Two times a day (BID) | ORAL | Status: DC
  Administered 2013-08-11 – 2013-08-24 (×17): 237 mL via ORAL

## 2013-08-11 NOTE — Evaluation (Signed)
Speech Language Pathology Assessment and Plan  Patient Details  Name: Gabriel [name] MRN: [id number] Date of Birth: 18-Jun-1941  SLP Diagnosis: Dysarthria;Cognitive Impairments;Speech and Language deficits;Dysphagia  Rehab Potential: Good ELOS: 2 to 3 weeks   Today's Date: 08/11/2013 Time: 1007-1108 Time Calculation (min): 61 min  Problem List:  Patient Active Problem List   Diagnosis Date Noted  . CVA (cerebral infarction) 08/11/2013  . Thrombocytopenia 08/10/2013  . Essential hypertension, benign 08/10/2013  . Encounter for long-term (current) use of medications 08/10/2013  . Arterial ischemic stroke, MCA (middle cerebral artery), right, acute 08/10/2013  . Stroke 08/07/2013  . NSTEMI (non-ST elevated myocardial infarction) 08/06/2013  . Hypertension 02/09/2013  . Pain of left leg 02/09/2013  . Lung mass 02/09/2013  . Soft tissue mass 02/09/2013  . 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 Cancer   . Hypertension   . Glaucoma   . Shortness of breath     with activity  . Stroke 08/06/2013    tpa given  . NSTEMI (non-ST elevated myocardial infarction) 08/06/2013  . Thrombocytopenia 08/10/2013   Past Surgical History:  Past Surgical History  Procedure Laterality Date  . Cataract extraction, bilateral Bilateral 2009    Assessment / Plan / Recommendation Clinical Impression  Gabriel [name] is a 72 y.o. right-handed male with history of hypertension, chronic back pain, metastatic lung cancer(Dr.Shadad) and has been receiving chemotherapy radiation therapy. Patient used a cane and/or a walker prior to admission due to chronic back pain. Admitted to [facility] 08/06/2013 with left-sided weakness and facial droop. Initial cranial CT scan showed no acute abnormalities. TPA was administered. Findings of elevated troponin and normal CK-MB as well as no EKG. Patient was transferred to [facility]  for ongoing evaluation. MRI of the brain showed moderate-sized acute nonhemorrhagic infarct right periopercular region, right subinsular region as well as lenticular nucleus and caudate. MRA of the head with atherosclerotic type changes. Carotid Dopplers with left 40-59% ICA stenosis. Cardiology services followup for elevated troponin suspect non-ST elevated myocardial infarction with echocardiogram showing ejection fraction of 60% and grade 1 diastolic dysfunction without emboli . Patient was placed on aspirin for CVA prophylaxis as well as cardiac standpoint. Consultation made to hematology(Dr.Marginat) 08/09/2013 in regard to thrombocytopenia 26,000 and decreased to 19,000 and advised to hold on any antiplatelet agents thus aspirin was held. Noted patient's platelet count ranged from 188-220,000 as of April 2014. Recommendations have been made to transfuse if platelets 10,000 or less and not to initiate antiplatelet agents until counts are 50,000 or higher. It was anticipated recovery of platelet count would take several days. Dysphagia 2 nectar liquids and followup speech therapy. Physical and occupational therapy evaluations completed 08/08/2013 with recommendations of physical medicine rehabilitation consult to consider inpatient rehabilitation services . Patient was felt to be a good candidate was admitted for comprehensive rehabilitation program.  Patient transferred to CIR on 08/10/13.  Bedside-swallow and cognitive-linguistic evaluations completed on 08/11/13.  SLP observed trials of patient self-feeding Dys.2 and Dys.3 textures and nectar-thick and thin liquids; no overt s/s of aspiration were observed during any trials at the bedside.  Patient recalled and utilized two swallows per bite/sip during all trials; strategy more effective with thin liquids as SLP suspects there was not as much residue in the vallecular space.  MBS on 10/12 reported sensed aspiration with thin liquids; patient did not  cough/clear throat during thin trials (no sensed aspiration) and  SLP would recommend water protocol procedures to be put into effect.  Dys.3 trials resulted in adequate mastication with some left oral pocketing; patient required Mod verbal and visual cues to clear oral residue.  SLP recommends therapeutic Dys.3 trials with SLP supervision only.  Cognitively, patient presents with overall variable mild to moderate deficits in areas of awareness and safety across tasks.  Patient also reports he handles financial information at home and strategies for complex problem solving should be reviewed.  Linguistically, patient's overall language abilities are within normal limits, although his speech intelligibility is moderately decreased due to his dysarthria.  Ultimately, it is recommended that patient receive skilled SLP intervention focusing speech intelligibility and cognition, as well as upgrading his diet to the least restrictive p.o. in order to maximize his overall safety and functional independent and to reduce burden of care prior to discharge.    SLP Assessment  Patient will need skilled Speech Lanaguage Pathology Services during CIR admission    Recommendations  Diet Recommendations: Dysphagia 2 (Fine chop);Nectar-thick liquid (Water Protocol) Liquid Administration via: Cup;No straw Medication Administration: Whole meds with puree Supervision: Full supervision/cueing for compensatory strategies Compensations: Slow rate;Small sips/bites;Check for pocketing;Multiple dry swallows after each bite/sip Postural Changes and/or Swallow Maneuvers: Seated upright 90 degrees Oral Care Recommendations: Oral care BID Patient destination: Home Follow up Recommendations: 24 hour supervision/assistance Equipment Recommended: None recommended by SLP    SLP Frequency 5 out of 7 days   SLP Treatment/Interventions Cognitive remediation/compensation;Cueing hierarchy;Dysphagia/aspiration precaution  training;Functional tasks;Internal/external aids;Oral motor exercises;Patient/family education;Speech/Language facilitation;Therapeutic Exercise;Therapeutic Activities    Pain Pain Assessment Pain Assessment: 0-10 Pain Score: 4  Pain Type: Acute pain Pain Location: Back Pain Orientation: Lower Pain Descriptors / Indicators: Aching Pain Intervention(s): Repositioned Multiple Pain Sites: No Prior Functioning Cognitive/Linguistic Baseline: Information not available (Patient does not report any baseline deficits; no family present) Type of Home: House  Lives With: Spouse Available Help at Discharge: Family;Friend(s);Available 24 hours/day Vocation: Retired  Teacher, music Term Goals: Week 1: SLP Short Term Goal 1 (Week 1): Patient will consume Dys.2 textures and thin liquids with Min verbal cues to recall and utilize safe swallow strategies to minimize s/s of aspiration. SLP Short Term Goal 2 (Week 1): Patient will perform pharyngeal strengthening exercises with Min verbal cues. SLP Short Term Goal 3 (Week 1): Patient will recall and utilize speech intelligibility strategies during a structured task with Min verbal cues. SLP Short Term Goal 4 (Week 1): Patient will demonstrate moderately-complex problem solving during functional task with Mod verbal cues. SLP Short Term Goal 5 (Week 1): Patient will label 2 physical and 2 cognitive deficits that resulted from CVA with Min question cues. SLP Short Term Goal 6 (Week 1): Patient will request help as needed with Mod question and verbal cues.  See FIM for current functional status Refer to Care Plan for Long Term Goals  Recommendations for other services: None  Discharge Criteria: Patient will be discharged from SLP if patient refuses treatment 3 consecutive times without medical reason, if treatment goals not met, if there is a change in medical status, if patient makes no progress towards goals or if patient is discharged from hospital.  The above  assessment, treatment plan, treatment alternatives and goals were discussed and mutually agreed upon: by patient  Levora Angel 08/11/2013, 11:51 AM

## 2013-08-11 NOTE — Evaluation (Signed)
The assessment and plan has been reviewed and SLP is in agreement. Mignon Bechler, M.A., CCC-SLP [phone]  

## 2013-08-11 NOTE — Evaluation (Signed)
Occupational Therapy Assessment and Plan  Patient Details  Name: Gabriel [name] MRN: [id number] Date of Birth: 1941/03/08  OT Diagnosis: abnormal posture, acute pain, cognitive deficits, hemiplegia affecting non-dominant side, lumbago (low back pain) and muscle weakness (generalized) Rehab Potential: Rehab Potential: Excellent ELOS: 3 weeks   Today's Date: 08/11/2013 Time: 0800-0902 Time Calculation (min): 62 min  Problem List:  Patient Active Problem List   Diagnosis Date Noted  . CVA (cerebral infarction) 08/11/2013  . Thrombocytopenia 08/10/2013  . Essential hypertension, benign 08/10/2013  . Encounter for long-term (current) use of medications 08/10/2013  . Arterial ischemic stroke, MCA (middle cerebral artery), right, acute 08/10/2013  . Stroke 08/07/2013  . NSTEMI (non-ST elevated myocardial infarction) 08/06/2013  . Hypertension 02/09/2013  . Pain of left leg 02/09/2013  . Lung mass 02/09/2013  . Soft tissue mass 02/09/2013  . 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 Cancer   . Hypertension   . Glaucoma   . Shortness of breath     with activity  . Stroke 08/06/2013    tpa given  . NSTEMI (non-ST elevated myocardial infarction) 08/06/2013  . Thrombocytopenia 08/10/2013   Past Surgical History:  Past Surgical History  Procedure Laterality Date  . Cataract extraction, bilateral Bilateral 2009    Assessment & Plan Clinical Impression: Patient is a 72 y.o. year old male with recent admission to the hospital on 08/06/2013 with left-sided weakness and facial droop. Initial cranial CT scan showed no acute abnormalities. TPA was administered. Findings of elevated troponin and normal CK-MB as well as no EKG. Patient was transferred to [facility] for ongoing evaluation. MRI of the brain showed moderate-sized acute nonhemorrhagic infarct right periopercular region, right subinsular region as  well as lenticular nucleus and caudate. MRA of the head with atherosclerotic type changes.  Patient transferred to CIR on 08/10/2013 .    Patient currently requires total with basic self-care skills secondary to muscle weakness, impaired timing and sequencing, unbalanced muscle activation, motor apraxia, ataxia, decreased coordination and decreased motor planning, decreased midline orientation and decreased motor planning and decreased safety awareness and decreased memory.  Prior to hospitalization, patient could complete ADLS with modified independent .  Patient will benefit from skilled intervention to decrease level of assist with basic self-care skills and increase independence with basic self-care skills prior to discharge home with care partner.  Anticipate patient will require 24 hour supervision and follow up home health.  OT - End of Session Activity Tolerance: Tolerates 10 - 20 min activity with multiple rests Endurance Deficit: Yes Endurance Deficit Description: Pt requesting to sit down after only standing for 15-20 seconds. OT Assessment Rehab Potential: Excellent OT Patient demonstrates impairments in the following area(s): Balance;Cognition;Endurance;Motor;Pain;Perception;Safety;Sensory;Vision OT Basic ADL's Functional Problem(s): Eating;Grooming;Bathing;Dressing;Toileting OT Transfers Functional Problem(s): Toilet;Tub/Shower OT Additional Impairment(s): Fuctional Use of Upper Extremity OT Plan OT Intensity: Minimum of 1-2 x/day, 45 to 90 minutes OT Frequency: 5 out of 7 days OT Duration/Estimated Length of Stay: 3 weeks OT Treatment/Interventions: Balance/vestibular training;Cognitive remediation/compensation;Community reintegration;Discharge planning;Functional mobility training;DME/adaptive equipment instruction;Neuromuscular re-education;Pain management;Patient/family education;Psychosocial support;Therapeutic Activities;Splinting/orthotics;Self Care/advanced ADL  retraining;Therapeutic Exercise;UE/LE Strength taining/ROM;UE/LE Coordination activities;Visual/perceptual remediation/compensation OT Self Feeding Anticipated Outcome(s): modified independent OT Basic Self-Care Anticipated Outcome(s): min assist  OT Toileting Anticipated Outcome(s): min assist OT Bathroom Transfers Anticipated Outcome(s): min assist OT Recommendation Patient destination: Home Equipment Recommended: Tub/shower bench   OT Evaluation Precautions/Restrictions  Precautions Precautions: Fall Precaution Comments: pusher, left  hemiparesis with decreased motor planning  Pain Pain Assessment Pain Assessment: 0-10 Pain Score: 4  Pain Type: Acute pain Pain Location: Back Pain Orientation: Lower Pain Descriptors / Indicators: Aching Pain Intervention(s): Repositioned Multiple Pain Sites: No Home Living/Prior Functioning Home Living Family/patient expects to be discharged to:: Private residence Living Arrangements: Spouse/significant other Available Help at Discharge: Family;Friend(s);Available 24 hours/day Type of Home: House Home Access: Stairs to enter [employer] of Steps: 1 Home Layout: One level Additional Comments: pt using mother in laws old rollator   Lives With: Spouse Prior Function Level of Independence: Requires assistive device for independence  Able to Take Stairs?: Yes Driving: Yes Vocation: Retired Leisure: Hobbies-yes (Comment) Comments: likes to hunt, anything outdoors ADL  See FIM scale  Vision/Perception  Vision - History Baseline Vision: Wears glasses all the time Visual History: Glaucoma Patient Visual Report: No change from baseline Vision - Assessment Eye Alignment: Within Functional Limits Vision Assessment: Vision impaired - to be further tested in functional context Additional Comments: Plt with decreased smoothness with all tracking.  Will continue to assess in function. Perception Perception: Not tested Spatial  Orientation: Pt with increased pushing to the left in standing. Praxis Praxis: Impaired Praxis Impairment Details: Motor planning Praxis-Other Comments: Pt unable to coordinate movements for bathing and dressing with the left hand even though he has the functional strength.  Cognition Overall Cognitive Status: Impaired/Different from baseline Arousal/Alertness: Awake/alert Orientation Level: Oriented X4 Attention: Sustained;Selective Sustained Attention: Appears intact Selective Attention: Appears intact Memory: Impaired Memory Impairment: Retrieval deficit;Decreased recall of new information Awareness: Impaired Awareness Impairment: Anticipatory impairment Problem Solving: Impaired Problem Solving Impairment: Functional basic Behaviors: Restless;Impulsive Safety/Judgment: Impaired Comments: Stated he could get into bed by himself if he was moved a little closer without assistance of staff. Sensation Sensation Light Touch: Appears Intact Stereognosis: Impaired Detail Stereognosis Impaired Details: Impaired LUE Hot/Cold: Not tested Proprioception: Impaired Detail Proprioception Impaired Details: Impaired LUE Coordination Gross Motor Movements are Fluid and Coordinated: No Fine Motor Movements are Fluid and Coordinated: No Coordination and Movement Description: Pt with decreased functional use/movement in the LUE during bathing and dressing.  Also noted with increased ataxia when attempting to perform finger to nose testing. Heel Shin Test: Pt unable to perform L over R due to weakness in RLE, grossly normal with R over L.  Motor  Motor Motor: Hemiplegia;Abnormal postural alignment and control;Motor apraxia Motor - Skilled Clinical Observations: Pt with decreased ability to execute LUE movements during bathing and dressing.  Increased posterior lean and LOB with dynamic sitting.  Pt with motor inpersistence in sitting EOB resultin in frequent LOB with dressin tasks.  Pt also with  moderate pusher tendencies to the left in standing.   Mobility  Bed Mobility Bed Mobility: Supine to Sit Rolling Right: 3: Mod assist Rolling Right Details: Verbal cues for technique;Verbal cues for precautions/safety;Manual facilitation for weight shifting;Manual facilitation for weight bearing;Manual facilitation for placement;Verbal cues for sequencing Right Sidelying to Sit: 2: Max assist;HOB flat Right Sidelying to Sit Details: Verbal cues for sequencing;Verbal cues for technique;Verbal cues for precautions/safety;Manual facilitation for weight shifting;Manual facilitation for placement;Manual facilitation for weight bearing Supine to Sit: 3: Mod assist Supine to Sit Details: Verbal cues for technique;Manual facilitation for weight shifting Transfers Transfers: Sit to Stand Sit to Stand: 1: +2 Total assist;From bed Sit to Stand Details: Verbal cues for sequencing;Verbal cues for technique;Manual facilitation for weight shifting Stand to Sit: 1: +2 Total assist Stand to Sit Details (indicate cue type and reason): Verbal  cues for safe use of DME/AE;Verbal cues for technique;Manual facilitation for weight shifting  Trunk/Postural Assessment  Cervical Assessment Cervical Assessment: Within Functional Limits Thoracic Assessment Thoracic Assessment: Within Functional Limits Lumbar Assessment Lumbar Assessment: Within Functional Limits Postural Control Postural Control: Deficits on evaluation Trunk Control: Pt sits in a posterior pelvic tilt with increased weightbearing and lateral trunk elongation on the left side.  Also with decreased ability to achieve full upright standing through his trunk when attempting to pull his pants over his hips. Postural Limitations: Pt with left lateral lean in sitting and standing.  Also demonstrates forward head and rounded shoulders with increased posterior pelvic tilt in sitting.   Balance Balance Balance Assessed: Yes Static Sitting Balance Static  Sitting - Balance Support: Right upper extremity supported;Left upper extremity supported Static Sitting - Level of Assistance: 4: Min assist Static Standing Balance Static Standing - Balance Support: Right upper extremity supported Static Standing - Level of Assistance: 1: +2 Total assist Static Standing - Comment/# of Minutes: Requires +2 assist in standing due to pusher tendencies.  Extremity/Trunk Assessment RUE Assessment RUE Assessment: Within Functional Limits LUE Assessment LUE Assessment: Exceptions to [facility] LUE Strength LUE Overall Strength Comments: Pt with Brunnstrom stage V movement in the arm and hand.  Demonstrates full gross grasp and release as well as some isolated shoulder, elbow, and wrist movements.  Unable to coordinate movements currently for functional use secondary to motor planning deficits.    FIM:  FIM - Eating Eating Activity: 5: Supervision/cues;4: Helper checks for pocketed food FIM - Grooming Grooming Steps: Wash, rinse, dry face Grooming: 3: Patient completes 2 of 4 or 3 of 5 steps FIM - Bathing Bathing Steps Patient Completed: Chest;Left Arm;Abdomen;Right upper leg;Left upper leg Bathing: 1: Two helpers FIM - Upper Body Dressing/Undressing Upper body dressing/undressing steps patient completed: Put head through opening of pull over shirt/dress;Thread/unthread left sleeve of pullover shirt/dress Upper body dressing/undressing: 3: Mod-Patient completed 50-74% of tasks FIM - Lower Body Dressing/Undressing Lower body dressing/undressing: 1: Two helpers FIM - Banker Devices: Arm rests Bed/Chair Transfer: 2: Supine > Sit: Max A (lifting assist/Pt. 25-49%);2: Bed > Chair or W/C: Max A (lift and lower assist)   Refer to Care Plan for Long Term Goals  Recommendations for other services: None  Discharge Criteria: Patient will be discharged from OT if patient refuses treatment 3 consecutive times without medical reason,  if treatment goals not met, if there is a change in medical status, if patient makes no progress towards goals or if patient is discharged from hospital.  The above assessment, treatment plan, treatment alternatives and goals were discussed and mutually agreed upon: by patient  Began working on selfcare retraining at [facility] incorporating balance, safety, LUE functional use and sequencing.  Pt with severe pusher strategies in standing and with transitions sit to stand.  Also with moderate motor planning difficulty and ataxia with attempted LUE functional use.    Sheilah Rayos OTR/L 08/11/2013, 1:11 PM

## 2013-08-11 NOTE — Evaluation (Signed)
Physical Therapy Assessment and Plan  Patient Details  Name: Gabriel [name] MRN: [id number] Date of Birth: Aug 09, 1941  PT Diagnosis: Abnormal posture, Abnormality of gait, Ataxia, Ataxic gait, Cognitive deficits, Coordination disorder, Difficulty walking, Hemiparesis non-dominant, Impaired cognition, Impaired sensation and Muscle weakness Rehab Potential: Good ELOS: 3 weeks   Today's Date: 08/11/2013 Time: 0900-1000 Time Calculation (min): 60 min  Problem List:  Patient Active Problem List   Diagnosis Date Noted  . CVA (cerebral infarction) 08/11/2013  . Thrombocytopenia 08/10/2013  . Essential hypertension, benign 08/10/2013  . Encounter for long-term (current) use of medications 08/10/2013  . Arterial ischemic stroke, MCA (middle cerebral artery), right, acute 08/10/2013  . Stroke 08/07/2013  . NSTEMI (non-ST elevated myocardial infarction) 08/06/2013  . Hypertension 02/09/2013  . Pain of left leg 02/09/2013  . Lung mass 02/09/2013  . Soft tissue mass 02/09/2013  . 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 Cancer   . Hypertension   . Glaucoma   . Shortness of breath     with activity  . Stroke 08/06/2013    tpa given  . NSTEMI (non-ST elevated myocardial infarction) 08/06/2013  . Thrombocytopenia 08/10/2013   Past Surgical History:  Past Surgical History  Procedure Laterality Date  . Cataract extraction, bilateral Bilateral 2009    Assessment & Plan Clinical Impression: Patient is a 72 y.o. year old right-handed male with history of hypertension, chronic back pain, metastatic lung cancer(Dr.Shadad) and has been receiving chemotherapy radiation therapy. Patient used a cane and/or a walker prior to admission due to chronic back pain. Admitted to [facility] 08/06/2013 with left-sided weakness and facial droop. Initial cranial CT scan showed no acute abnormalities. TPA was administered.  Findings of elevated troponin and normal CK-MB as well as no EKG. Patient was transferred to [facility] for ongoing evaluation. MRI of the brain showed moderate-sized acute nonhemorrhagic infarct right periopercular region, right subinsular region as well as lenticular nucleus and caudate. MRA of the head with atherosclerotic type changes. Carotid Dopplers with left 40-59% ICA stenosis. Cardiology services followup for elevated troponin suspect non-ST elevated myocardial infarction with echocardiogram showing ejection fraction of 60% and grade 1 diastolic dysfunction without emboli . Patient was placed on aspirin for CVA prophylaxis as well as cardiac standpoint. Consultation made to hematology(Dr.Marginat) 08/09/2013 in regard to thrombocytopenia 26,000 and decreased to 19,000 and advised to hold on any antiplatelet agents thus aspirin was held. Noted patient's platelet count ranged from 188-220,000 as of April 2014. Recommendations have been made to transfuse if platelets 10,000 or less and not to initiate antiplatelet agents until counts are 50,000 or higher. It was anticipated recovery of platelet count would take several days. Patient transferred to CIR on 08/10/2013 .   Patient currently requires max assist for transfers and  total +2 for all other standing/ambulation mobility secondary to muscle weakness, impaired timing and sequencing, unbalanced muscle activation, ataxia and decreased coordination, decreased attention to left and decreased attention, decreased awareness, decreased problem solving, decreased safety awareness and decreased memory.  Prior to hospitalization, patient was supervision with mobility and lived with Spouse in a House home.  Home access is 1 .  Patient will benefit from skilled PT intervention to maximize safe functional mobility, minimize fall risk and decrease caregiver burden for planned discharge home with 24 hour assist.  Anticipate patient will benefit from follow  up [facility] at discharge.  PT - End of Session  Activity Tolerance: Tolerates 30+ min activity with multiple rests Endurance Deficit: Yes Endurance Deficit Description: Pt with constant c/o fatigue and wanting to return to bed during session.  PT Assessment Rehab Potential: Good Barriers to Discharge: Decreased caregiver support PT Patient demonstrates impairments in the following area(s): Balance;Endurance;Motor;Perception;Safety;Sensory PT Transfers Functional Problem(s): Bed Mobility;Bed to Chair;Car PT Locomotion Functional Problem(s): Ambulation;Wheelchair Mobility;Stairs PT Plan PT Intensity: Minimum of 1-2 x/day ,45 to 90 minutes PT Frequency: 5 out of 7 days PT Duration Estimated Length of Stay: 3 weeks PT Treatment/Interventions: Ambulation/gait training;Balance/vestibular training;Cognitive remediation/compensation;Discharge planning;DME/adaptive equipment instruction;Functional electrical stimulation;Functional mobility training;Neuromuscular re-education;Patient/family education;Splinting/orthotics;Stair training;Therapeutic Activities;Therapeutic Exercise;UE/LE Strength taining/ROM;UE/LE Coordination activities;Visual/perceptual remediation/compensation;Wheelchair propulsion/positioning PT Transfers Anticipated Outcome(s): min assist  PT Locomotion Anticipated Outcome(s): min assist PT Recommendation Follow Up Recommendations: Home health PT;24 hour supervision/assistance Patient destination: Home Equipment Recommended: Wheelchair cushion (measurements);Wheelchair (measurements) Equipment Details: additional equipment TBD upon pts progression on CIR>   Skilled Therapeutic Intervention Pt received sitting in w/c, having just finished with OT session and agreeable to PT evaluation.  Assessed cognition, sensation, MMT, coordination, and proprioception all in room before assisting pt to gym for further assessment.  Performed squat pivot transfer to R from w/c to mat with max instructional  cuing for task (scooting to edge of chair, scooting closer to R side, weight shifting in order to scoot one hip at a time, hand placement, forward weight shift and lean).  Performed at mod/max level to R, however at end of session, performed to L with increased assist required due to pushing tendencies/postures, decreased ability to maintain midline, and decreased forward weight shift.  Performed bed mobility at mod to max assist with cues for attending L side by providing questioning cues when rolling.  Also requires assist for elevating trunk, tactile and verbal cues for hand placement, and technique to elevate LEs into/out of bed.  Performed gait training with +2 assist in "three muskateer" technique for safety with severe left lateral lean and ataxic movement in LLE when stepping.  Also performed stair training with +2 assist using L handrail.  See details below.  Provided manual assist at knee to prevent buckle and also to facilitate quad activation during L stance.  Provided max cues for technique and safety, as he continues to demonstrate some impulsive tendencies during mobility.  Had pt self propel w/c in hallway using R hemi technique, however requires max cues and mod assist due to noted L inattention and decreased emergent awareness during task.  Assisted remainder of distance to room and left in w/c with quick release belt donned.  Pt with continued questioning about returning to bed and provided max cues for remaining in chair for speech therapy evaluation following PT session.  Pt verbalized agreement, however required several reminders due to decreased memory noted.  Also noted small skin tear on pts L elbow, therefore notified RN and placed tegiderm for safety and to ensure skin integrity.      PT Evaluation Precautions/Restrictions Precautions Precautions: Fall Precaution Comments: pusher Restrictions Weight Bearing Restrictions: No General Chart Reviewed: Yes Vital Signs  Pain Pain  Assessment Pain Assessment: 0-10 Pain Score: 4  Pain Type: Acute pain Pain Location: Back Pain Orientation: Lower Pain Descriptors / Indicators: Aching Pain Intervention(s): Repositioned Multiple Pain Sites: No Home Living/Prior Functioning Home Living Available Help at Discharge: Family;Friend(s);Available 24 hours/day Type of Home: House Entrance Stairs-Number of Steps: 1 Home Layout: One level Additional Comments: pt using mother in laws old rollator   Lives With: Spouse Prior Function  Level of Independence: Requires assistive device for independence  Able to Take Stairs?: Yes Driving: Yes Vocation: Retired Leisure: Hobbies-yes (Comment) Comments: likes to hunt, anything outdoors Vision/Perception     Cognition Overall Cognitive Status: Impaired/Different from baseline Arousal/Alertness: Awake/alert Orientation Level: Oriented X4 Attention: Sustained;Selective Sustained Attention: Appears intact Selective Attention: Appears intact Selective Attention Impairment: Functional basic Memory: Impaired Memory Impairment: Retrieval deficit;Decreased recall of new information Awareness: Impaired Awareness Impairment: Intellectual impairment (Cognitive deficits) Problem Solving: Impaired Problem Solving Impairment: Functional basic Behaviors: Restless;Impulsive Safety/Judgment: Impaired Comments: Stated he could get into bed by himself if he was moved a little closer Sensation Sensation Light Touch: Appears Intact Hot/Cold: Impaired by gross assessment Coordination Gross Motor Movements are Fluid and Coordinated: No Fine Motor Movements are Fluid and Coordinated: No Coordination and Movement Description: Pt with ataxic movmement noted in LLE during ambulation and stair training.  Heel Shin Test: Pt unable to perform L over R due to weakness in RLE, grossly normal with R over L.  Motor  Motor Motor: Hemiplegia;Ataxia;Abnormal postural alignment and control Motor -  Skilled Clinical Observations: Pt with decreased strength in LLE and decreased functional use of LUE.  Decreased postural control and leans to left in sitting and standing, pushes to the L in standing and gait training and also demonstrates ataxia during ambulation.   Mobility Bed Mobility Bed Mobility: Sit to Supine;Rolling Right;Right Sidelying to Sit Rolling Right: 3: Mod assist Rolling Right Details: Verbal cues for technique;Verbal cues for precautions/safety;Manual facilitation for weight shifting;Manual facilitation for weight bearing;Manual facilitation for placement;Verbal cues for sequencing Right Sidelying to Sit: 2: Max assist;HOB flat Right Sidelying to Sit Details: Verbal cues for sequencing;Verbal cues for technique;Verbal cues for precautions/safety;Manual facilitation for weight shifting;Manual facilitation for placement;Manual facilitation for weight bearing Supine to Sit: 3: Mod assist Supine to Sit Details: Verbal cues for technique;Verbal cues for precautions/safety;Manual facilitation for weight shifting;Manual facilitation for placement Transfers Transfers: Yes Sit to Stand: 1: +2 Total assist Sit to Stand Details: Verbal cues for sequencing;Verbal cues for technique;Verbal cues for precautions/safety;Manual facilitation for weight shifting;Manual facilitation for placement;Manual facilitation for weight bearing Stand to Sit: 1: +2 Total assist Stand to Sit Details (indicate cue type and reason): Verbal cues for sequencing;Verbal cues for technique;Verbal cues for precautions/safety;Manual facilitation for weight shifting;Manual facilitation for placement;Manual facilitation for weight bearing Squat Pivot Transfers: 2: Max Designer, television/film set Transfer Details: Verbal cues for sequencing;Verbal cues for technique;Verbal cues for precautions/safety;Verbal cues for safe use of DME/AE;Manual facilitation for weight shifting;Manual facilitation for placement;Manual facilitation for  weight bearing Locomotion  Ambulation Ambulation/Gait Assistance: 1: +2 Total assist Ambulation Distance (Feet): 10 Feet Assistive device: 2 person hand held assist (three muskateer style) Ambulation/Gait Assistance Details: Verbal cues for sequencing;Verbal cues for technique;Verbal cues for precautions/safety;Verbal cues for gait pattern;Manual facilitation for weight shifting;Manual facilitation for placement;Manual facilitation for weight bearing Ambulation/Gait Assistance Details: Pt demonstrate pusher tendencies to L during standing and gait training and also is ataxic with L foot placement during gait training and stair training.  Gait Gait: Yes Gait Pattern: Impaired Gait Pattern: Decreased stance time - left;Decreased weight shift to left;Step-to pattern;Ataxic;Trunk flexed;Lateral trunk lean to left;Left flexed knee in stance Stairs / Additional Locomotion Stairs: Yes Stairs Assistance: 1: +2 Total assist Stairs Assistance Details: Verbal cues for sequencing;Verbal cues for technique;Verbal cues for precautions/safety;Verbal cues for gait pattern;Manual facilitation for weight shifting;Manual facilitation for placement;Manual facilitation for weight bearing Stair Management Technique: One rail Right;Step to pattern;Forwards Number of Stairs: 10 Height of  Stairs: 6 (and 4") Wheelchair Mobility Distance: 69'  Trunk/Postural Assessment  Postural Control Postural Control: Deficits on evaluation Postural Limitations: Pt with left lateral lean in sitting and standing.  Also demonstrates forward head and rounded shoulders with increased posterior pelvic tilt in sitting.   Balance Balance Balance Assessed: Yes Static Standing Balance Static Standing - Balance Support: Bilateral upper extremity supported Static Standing - Level of Assistance: 1: +2 Total assist Static Standing - Comment/# of Minutes: Requires +2 assist in standing due to pusher tendencies.  Extremity Assessment       RLE Assessment RLE Assessment: Exceptions to [facility] RLE Strength RLE Overall Strength: Deficits RLE Overall Strength Comments: hip flex 3+/5, knee flex 4/5, knee ext 4/5, ankle DF 3+/5 LLE Assessment LLE Assessment: Exceptions to [facility] LLE Strength LLE Overall Strength: Deficits LLE Overall Strength Comments: hip flex 2/5, knee flex 3+/5, knee ext 3+/5, ankle DF 3/5, ankle PF 3+/5  FIM:  FIM - Bed/Chair Transfer Bed/Chair Transfer Assistive Devices: Arm rests Bed/Chair Transfer: 2: Supine > Sit: Max A (lifting assist/Pt. 25-49%);3: Sit > Supine: Mod A (lifting assist/Pt. 50-74%/lift 2 legs);2: Bed > Chair or W/C: Max A (lift and lower assist);2: Chair or W/C > Bed: Max A (lift and lower assist) FIM - Locomotion: Wheelchair Distance: 55' Locomotion: Wheelchair: 2: Travels 50 - 149 ft with moderate assistance (Pt: 50 - 74%) FIM - Locomotion: Ambulation Locomotion: Ambulation Assistive Devices:  ("three muskateer style") Ambulation/Gait Assistance: 1: +2 Total assist Locomotion: Ambulation: 1: Two helpers FIM - Locomotion: Stairs Locomotion: Building control surveyor: Radio broadcast assistant - 1 Locomotion: Stairs: 1: Two helpers   Refer to [employer] for Long Term Goals  Recommendations for other services: None  Discharge Criteria: Patient will be discharged from PT if patient refuses treatment 3 consecutive times without medical reason, if treatment goals not met, if there is a change in medical status, if patient makes no progress towards goals or if patient is discharged from hospital.  The above assessment, treatment plan, treatment alternatives and goals were discussed and mutually agreed upon: by patient  [name] 08/11/2013, 12:22 PM

## 2013-08-11 NOTE — Progress Notes (Signed)
INITIAL NUTRITION ASSESSMENT  DOCUMENTATION CODES Per approved criteria  -Severe malnutrition in the context of chronic illness   INTERVENTION: Add Ensure Complete po BID, each supplement provides 350 kcal and 13 grams of protein. Please thicken to appropriate consistency. Please provide yogurt or pudding with medications. RD to continue to follow nutrition care plan.  NUTRITION DIAGNOSIS: Increased nutrient needs related to catabolic illness as evidenced by estimated needs.   Goal: Intake to meet >90% of estimated nutrition needs.  Monitor:  weight trends, lab trends, I/O's, PO intake, supplement tolerance, diet rx per SLP  Reason for Assessment: Malnutrition Screening Tool  72 y.o. male  Admitting Dx: CVA (cerebral infarction)  ASSESSMENT: PMHx significant for HTN, chronic back pain, metastatic lung CA and ongoing chemo/radiation therapy. Admitted to acute hospital on 10/11 with acute nonhemorrhagic infarct. Admitted to rehab on 10/15.  MBSS completed 10/12 with recommendations for Dysphagia 2 diet with Nectar-Thickened Liquids. Remains on this diet presently.  Pt with 17% wt loss over the past 6 months. This is significant. Wife reports that pt is not a picky eater, will drink Boost and Ensure. Wife constantly rubbing patient's cheek during our discussion. Pt appears thin, wife provides most of the information during the conversation. Wife asking for thickener and beverages for patient - RD informed RN.  Nutrition Focused Physical Exam:  Subcutaneous Fat:  Orbital Region: moderate depletion Upper Arm Region: severe depletion Thoracic and Lumbar Region: severe depletion  Muscle:  Temple Region: severe depletion Clavicle Bone Region: severe depletion Clavicle and Acromion Bone Region: severe depletion Scapular Bone Region: n/a Dorsal Hand: n/a Patellar Region: n/a Anterior Thigh Region: n/a Posterior Calf Region: n/a  Edema: none  Pt meets criteria for severe  MALNUTRITION in the context of chronic illness as evidenced by severe fat and muscle mass loss.    Height: Ht Readings from Last 1 Encounters:  08/10/13 5\' 10"  (1.778 m)    Weight: Wt Readings from Last 1 Encounters:  08/10/13 149 lb 14.4 oz (67.994 kg)    Ideal Body Weight: 166 lb  % Ideal Body Weight: 90%  Wt Readings from Last 10 Encounters:  08/10/13 149 lb 14.4 oz (67.994 kg)  08/07/13 145 lb 4.5 oz (65.9 kg)  02/10/13 180 lb (81.647 kg)    Usual Body Weight: 180 lb (6 months ago)  % Usual Body Weight: 83%  BMI:  Body mass index is 21.51 kg/(m^2). WNL  Estimated Nutritional Needs: Kcal: 2000 - 2300 Protein: 87 - 100 g  Fluid:  approx 2 liters daily  Skin: intact  Diet Order: Dysphagia 2; Nectar Liquids  EDUCATION NEEDS: -No education needs identified at this time   Intake/Output Summary (Last 24 hours) at 08/11/13 1138 Last data filed at 08/11/13 0520  Gross per 24 hour  Intake    480 ml  Output   1725 ml  Net  -1245 ml    Last BM: 10/14   Labs:   Recent Labs Lab 08/08/13 0525 08/11/13 0435  NA 130* 132*  K 3.8 4.0  CL 98 101  CO2 19 20  BUN 7 10  CREATININE 0.56 0.55  CALCIUM 8.1* 7.8*  GLUCOSE 80 107*    CBG (last 3)  No results found for this basename: GLUCAP,  in the last 72 hours  Scheduled Meds: . atorvastatin  80 mg Oral q1800  . fentaNYL  25 mcg Transdermal Q72H  . loratadine  10 mg Oral Daily  . metoprolol tartrate  12.5 mg Oral BID  .  pantoprazole  40 mg Oral QHS  . timolol  1 drop Both Eyes Daily    Continuous Infusions:   Past Medical History  Diagnosis Date  . Cancer   . Hypertension   . Glaucoma   . Shortness of breath     with activity  . Stroke 08/06/2013    tpa given  . NSTEMI (non-ST elevated myocardial infarction) 08/06/2013  . Thrombocytopenia 08/10/2013    Past Surgical History  Procedure Laterality Date  . Cataract extraction, bilateral Bilateral 2009    Jarold Motto MS, RD, [place] Pager:  [phone] After-hours pager: [phone]

## 2013-08-11 NOTE — Progress Notes (Addendum)
Subjective/Complaints: No issues eating breakfast nectar liq  Review of Systems - Negative except left weakness  Objective: Vital Signs: Blood pressure 152/64, pulse 67, temperature 98.7 F (37.1 C), temperature source Oral, resp. rate 17, height 5\' 10"  (1.778 m), weight 67.994 kg (149 lb 14.4 oz), SpO2 98.00%. No results found. Results for orders placed during the hospital encounter of 08/10/13 (from the past 72 hour(s))  CBC WITH DIFFERENTIAL     Status: Abnormal   Collection Time    08/11/13  4:35 AM      Result Value Range   WBC 9.4  4.0 - 10.5 K/uL   RBC 2.60 (*) 4.22 - 5.81 MIL/uL   Hemoglobin 8.5 (*) 13.0 - 17.0 g/dL   HCT 11.9 (*) 14.7 - 82.9 %   MCV 97.7  78.0 - 100.0 fL   MCH 32.7  26.0 - 34.0 pg   MCHC 33.5  30.0 - 36.0 g/dL   RDW 56.2 (*) 13.0 - 86.5 %   Platelets 17 (*) 150 - 400 K/uL   Comment: CRITICAL VALUE NOTED.  VALUE IS CONSISTENT WITH PREVIOUSLY REPORTED AND CALLED VALUE.     CONSISTENT WITH PREVIOUS RESULT   Neutrophils Relative % 81 (*) 43 - 77 %   Neutro Abs 7.6  1.7 - 7.7 K/uL   Lymphocytes Relative 12  12 - 46 %   Lymphs Abs 1.1  0.7 - 4.0 K/uL   Monocytes Relative 7  3 - 12 %   Monocytes Absolute 0.6  0.1 - 1.0 K/uL   Eosinophils Relative 0  0 - 5 %   Eosinophils Absolute 0.0  0.0 - 0.7 K/uL   Basophils Relative 0  0 - 1 %   Basophils Absolute 0.0  0.0 - 0.1 K/uL  COMPREHENSIVE METABOLIC PANEL     Status: Abnormal   Collection Time    08/11/13  4:35 AM      Result Value Range   Sodium 132 (*) 135 - 145 mEq/L   Potassium 4.0  3.5 - 5.1 mEq/L   Chloride 101  96 - 112 mEq/L   CO2 20  19 - 32 mEq/L   Glucose, Bld 107 (*) 70 - 99 mg/dL   BUN 10  6 - 23 mg/dL   Creatinine, Ser 7.84  0.50 - 1.35 mg/dL   Calcium 7.8 (*) 8.4 - 10.5 mg/dL   Total Protein 5.6 (*) 6.0 - 8.3 g/dL   Albumin 2.5 (*) 3.5 - 5.2 g/dL   AST 24  0 - 37 U/L   ALT 7  0 - 53 U/L   Alkaline Phosphatase 83  39 - 117 U/L   Total Bilirubin 0.4  0.3 - 1.2 mg/dL   GFR calc non Af  Amer >90  >90 mL/min   GFR calc Af Amer >90  >90 mL/min   Comment: (NOTE)     The eGFR has been calculated using the CKD EPI equation.     This calculation has not been validated in all clinical situations.     eGFR's persistently <90 mL/min signify possible Chronic Kidney     Disease.     HEENT: normal Cardio: RRR and no murmur Resp: CTA B/L and unlabored GI: BS positive and non distended Extremity:  Pulses positive and No Edema Skin:   Intact Neuro: Alert/Oriented, Cranial Nerve Abnormalities Left central 7, Normal Sensory, Abnormal Motor 3-/5 Left delt , bi, tri, grip, 4- in Left HF, KE, ADF, Abnormal FMC Ataxic/ dec FMC and Dysarthric Musc/Skel:  Normal and Other no pain with L shoulder ROM Gen NAD   Assessment/Plan: 1. Functional deficits secondary to Left hemiparesis R thrombotic periopercular CVA which require 3+ hours per day of interdisciplinary therapy in a comprehensive inpatient rehab setting. Physiatrist is providing close team supervision and 24 hour management of active medical problems listed below. Physiatrist and rehab team continue to assess barriers to discharge/monitor patient progress toward functional and medical goals. FIM:                                  Medical Problem List and Plan:  1. 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 DVT Prophylaxis/Anticoagulation: SCDs. Monitor for any signs of DVT  3. Pain Management/chronic back pain related to metastatic lung cancer: Duragesic patch as prior to admission. Monitor with increased mobility, norco 10/325 for breakthrough pain as well  4. Neuropsych: This patient is capable of making decisions on his own behalf.  5. Dysphagia. Dysphagia 2 nectar thick liquids. Monitor for any signs of aspiration. Followup speech therapy  6. Non-ST elevated myocardial infarction. Continue aspirin therapy. Followup  cardiology services as needed. Low-dose beta blocker has been added  7. Thrombocytopenia. Aspirin has been held and her platelet count 50,000 higher. Followup CBC. Hematology services continues to follow  8. Metastatic lung cancer. Followup oncology services Dr. Clelia Croft and radiation oncology Dr. Kathrynn Running. Patient received his first radiation treatment 02/14/2013 in addition to 3 cycle of chemotherapy last given 08/04/2013  9. Hyperlipidemia. Lipitor    LOS (Days) 1 A FACE TO FACE EVALUATION WAS PERFORMED  Bryan Goin E 08/11/2013, 8:24 AM

## 2013-08-11 NOTE — Care Management Note (Signed)
[facility] Individual Statement of Services  Patient Name:  Gabriel [name]  Date:  08/11/2013  Welcome to the [facility].  Our goal is to provide you with an individualized program based on your diagnosis and situation, designed to meet your specific needs.  With this comprehensive rehabilitation program, you will be expected to participate in at least 3 hours of rehabilitation therapies Monday-Friday, with modified therapy programming on the weekends.  Your rehabilitation program will include the following services:  Physical Therapy (PT), Occupational Therapy (OT), Speech Therapy (ST), 24 hour per day rehabilitation nursing, Case Management (Social Worker), Rehabilitation Medicine, Nutrition Services and Pharmacy Services  Weekly team conferences will be held on Wednesday to discuss your progress.  Your Social Worker will talk with you frequently to get your input and to update you on team discussions.  Team conferences with you and your family in attendance may also be held.  Expected length of stay: 3 weeks Overall anticipated outcome: supervision/min level  Depending on your progress and recovery, your program may change. Your Social Worker will coordinate services and will keep you informed of any changes. Your Social Worker's name and contact numbers are listed  below.  The following services may also be recommended but are not provided by the [facility]:   Driving Evaluations  Home Health Rehabiltiation Services  Outpatient Rehabilitation Services    Arrangements will be made to provide these services after discharge if needed.  Arrangements include referral to agencies that provide these services.  Your insurance has been verified to be:  [employer]-Medicare Your primary doctor is:  Dr Majel Homer  Pertinent information will be shared with your doctor and your insurance company.  Social Worker:  Dossie Der, SW  [phone] or ([phone]  Information discussed with and copy given to patient by: Lucy Chris, 08/11/2013, 12:31 PM

## 2013-08-11 NOTE — Progress Notes (Signed)
Occupational Therapy Session Note  Patient Details  Name: Gabriel [name] MRN: [id number] Date of Birth: [birth year]  Today's Date: 08/11/2013 Time: 4098-1191 Time Calculation (min): 33 min  Short Term Goals: Week 1:  OT Short Term Goal 1 (Week 1): Pt will perform LB bathing sit to stand with mod assist. OT Short Term Goal 2 (Week 1): Pt will donn a pullover shirt with supervision. OT Short Term Goal 3 (Week 1): Pt will maintain dynamic sitting balance with no more than min assist when attempting to donn LB clothing. OT Short Term Goal 4 (Week 1): Pt will perform toilet transfer to handicapped toilet or 3:1 with mod assist. OT Short Term Goal 5 (Week 1): Pt will use the LUE for bathing tasks with mod assist.  Skilled Therapeutic Interventions/Progress Updates:   Worked on toilet transfers during session from wheelchair to elevated toilet with rail.  Pt was able to transfer with max assist stand pivot to the toilet using the rail.  Once standing he needed total assist +2 for clothing management and hygiene.  Pt unable to void this trial so transferred back to the wheelchair stand pivot with total assist moving to the left side.  Performed sit to stand X1 as well with total assist as pt demonstrates increased pusher strategies to the left in standing with increased motor impersistence in the LLE.  Discussed pt's current level of assist with pt and pt's wife.  Also discussed attempting functional use of the LUE for simple grasping and releasing of small objects as pt cannot motor plan simple functional tasks at this time.  Isolated exercises also suggested for the shoulder and elbow but feel functional reaching will be better.       Therapy Documentation Precautions:  Precautions Precautions: Fall Precaution Comments: pusher, left hemiparesis with decreased motor planning Restrictions Weight Bearing Restrictions: No  Pain: Pain Assessment Pain Assessment: No/denies pain ADL: See FIM for  current functional status  Therapy/Group: Individual Therapy  Rashad Obeid OTR/L 08/11/2013, 3:48 PM

## 2013-08-11 NOTE — Progress Notes (Signed)
Social Work Assessment and Plan Social Work Assessment and Plan  Patient Details  Name: Gabriel [name] MRN: [id number] Date of Birth: 1941-05-17  Today's Date: 08/11/2013  Problem List:  Patient Active Problem List   Diagnosis Date Noted  . CVA (cerebral infarction) 08/11/2013  . Thrombocytopenia 08/10/2013  . Essential hypertension, benign 08/10/2013  . Encounter for long-term (current) use of medications 08/10/2013  . Arterial ischemic stroke, MCA (middle cerebral artery), right, acute 08/10/2013  . Stroke 08/07/2013  . NSTEMI (non-ST elevated myocardial infarction) 08/06/2013  . Hypertension 02/09/2013  . Pain of left leg 02/09/2013  . Lung mass 02/09/2013  . Soft tissue mass 02/09/2013  . 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 Cancer   . Hypertension   . Glaucoma   . Shortness of breath     with activity  . Stroke 08/06/2013    tpa given  . NSTEMI (non-ST elevated myocardial infarction) 08/06/2013  . Thrombocytopenia 08/10/2013   Past Surgical History:  Past Surgical History  Procedure Laterality Date  . Cataract extraction, bilateral Bilateral 2009   Social History:  reports that he has quit smoking. He does not have any smokeless tobacco history on file. He reports that he does not drink alcohol or use illicit drugs.  Family / Support Systems Marital Status: Married How Long?: 3 weeks Patient Roles: Spouse;Parent Spouse/Significant Other: Gabriel [name]  [phone]  [phone] Children: Gabriel [name]-step-daughter [phone] Other Supports: Pt's son Anticipated Caregiver: Wife Ability/Limitations of Caregiver: Wife is able to provide care except she works 8-12;00 daily-providing care to elderly gentleman Caregiver Availability: Other (Comment) (Arranging 24 hr care) Family Dynamics: Close knit family-pt and wife have been together for 6 years but recently got married.  Pt states: " We knew we  were meant to be together."  Wife is very supportive of pt.  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  Wife is encouraging and supportive of pt and has been assisting him prior to admission. Recent Psychosocial Issues: Recent cancer diagnosis and rad tx and chemo tx.   Pyschiatric History: No history deferred depression screen due to being exhausted from therapies.  Will monitor his coping and have nuero-psych intervene if needed, but also provide support while here. Substance Abuse History: No issues  Patient / Family Perceptions, Expectations & Goals Pt/Family understanding of illness & functional limitations: Pt and wife have a good understanding of his stroke and deficits.  Both are hopeful he will do well here and will do what they can for one another.  Pt is willing to give it his all here, but may need rest breaks in between therapies. Premorbid pt/family roles/activities: Husband, Father, Retiree, cancer pt, Mining engineer, etc Anticipated changes in roles/activities/participation: resume Pt/family expectations/goals: Pt states: " I hope to be able to do for myself, by the time I leave here."  Wife states: " I hope he will regain some of his function, but will do whatever to help him."  [employer]: Other (Comment) (Cancer  Center-for treatments) Premorbid Home Care/DME Agencies: None Transportation  available at discharge: Wife Resource referrals recommended: Support group (specify) (CVA Support group)  Discharge Planning Living Arrangements: Spouse/significant other Support Systems: Spouse/significant other;Children;Friends/neighbors;Church/faith community Type of Residence: Private residence Insurance Resources: Media planner (specify) ([employer]- Medicare) Surveyor, quantity Resources: Tree surgeon;Family Support Financial Screen Referred: No Living Expenses: Lives with family Money Management: Spouse;Patient Does the patient have any problems obtaining your medications?: No Home Management: Wife Patient/Family Preliminary Plans: Return home with wife who can be there most of the time, except 8-12 daily, while she takes care her client.  Wife is looking into someone to be with pt while she is gone and will await team's evaluations.  She plans to be here and be involved in his care while here. Social Work Anticipated Follow Up Needs: HH/OP;Support Group  Clinical Impression Pleasant couple who are supportive of one another and encouraging.  Wife is very hands on and jumps right in to assist pt.  Pt is motivated and wants to do well here, but may need rest breaks In between therapies to be able to fully participate.  Wife aware pt may require 24 hour care at discharge, will await evaluations.  Lucy Chris 08/11/2013, 1:18 PM

## 2013-08-11 NOTE — Progress Notes (Signed)
Patient information reviewed and entered into eRehab system by Zakary Kimura, RN, CRRN, PPS Coordinator.  Information including medical coding and functional independence measure will be reviewed and updated through discharge.     Per nursing patient was given "Data Collection Information Summary for Patients in Inpatient Rehabilitation Facilities with attached "Privacy Act Statement-Health Care Records" upon admission.  

## 2013-08-12 ENCOUNTER — Inpatient Hospital Stay (HOSPITAL_COMMUNITY): Payer: Medicare Other | Admitting: Speech Pathology

## 2013-08-12 ENCOUNTER — Inpatient Hospital Stay (HOSPITAL_COMMUNITY): Payer: Medicare Other | Admitting: Rehabilitation

## 2013-08-12 ENCOUNTER — Inpatient Hospital Stay (HOSPITAL_COMMUNITY): Payer: Medicare Other | Admitting: Occupational Therapy

## 2013-08-12 DIAGNOSIS — D6959 Other secondary thrombocytopenia: Secondary | ICD-10-CM

## 2013-08-12 DIAGNOSIS — C399 Malignant neoplasm of lower respiratory tract, part unspecified: Secondary | ICD-10-CM

## 2013-08-12 LAB — HEPARIN INDUCED THROMBOCYTOPENIA PNL
Patient O.D.: 0.05
UFH Low Dose 0.1 IU/mL: 0 % Release
UFH Low Dose 0.5 IU/mL: 0 % Release
UFH SRA Result: NEGATIVE

## 2013-08-12 MED ORDER — TAMSULOSIN HCL 0.4 MG PO CAPS
0.4000 mg | ORAL_CAPSULE | Freq: Every day | ORAL | Status: DC
  Administered 2013-08-12 – 2013-08-22 (×11): 0.4 mg via ORAL
  Filled 2013-08-12 (×13): qty 1

## 2013-08-12 NOTE — Plan of Care (Signed)
Problem: RH BLADDER ELIMINATION Goal: RH STG MANAGE BLADDER WITH ASSISTANCE STG Manage Bladder With Minimal Assistance  Outcome: Not Progressing No void since foley discontinued.Pt requires in and out catheterizations by staff

## 2013-08-12 NOTE — Progress Notes (Signed)
Occupational Therapy Session Note  Patient Details  Name: Gabriel [name] MRN: [id number] Date of Birth: [birth year]  Today's Date: 08/12/2013 Time: 1020-1119 Time Calculation (min): 59 min  Short Term Goals: Week 1:  OT Short Term Goal 1 (Week 1): Pt will perform LB bathing sit to stand with mod assist. OT Short Term Goal 2 (Week 1): Pt will donn a pullover shirt with supervision. OT Short Term Goal 3 (Week 1): Pt will maintain dynamic sitting balance with no more than min assist when attempting to donn LB clothing. OT Short Term Goal 4 (Week 1): Pt will perform toilet transfer to handicapped toilet or 3:1 with mod assist. OT Short Term Goal 5 (Week 1): Pt will use the LUE for bathing tasks with mod assist.  Skilled Therapeutic Interventions/Progress Updates:    Pt performed bathing and dressing this session sit to stand at the sink.  Focused on pt working on maintaining sitting balance on EOC while washing up.  He demonstrated decreased ability to divide his attention between balancing and bathing, and frequently fell back in his chair.  In sitting he also demonstrates a flexed posture with his neck and back.  Required max assist to integrate the LUE into bathing tasks for washing the left side and wringing out the washcloth.  Pt needing total assist for sit to stand and to maintain standing balance.  Pt with decreased ability to let go of the sink with the RUE and reach back for his chair.  Pt still with significant reliance of the RUE and RLE for standing.  Pt stating on multiple occasions that he would have to get back in the bed to put on his pants and underpants.  Pt cued him that it was more beneficial to work on these tasks in sitting and standing to help improve his balance.    Therapy Documentation Precautions:  Precautions Precautions: Fall Precaution Comments: pusher, left hemiparesis with decreased motor planning Restrictions Weight Bearing Restrictions: No  Pain: Pain  Assessment Pain Assessment: Faces Faces Pain Scale: Hurts little more Pain Type: Chronic pain Pain Location: Back Pain Intervention(s): Repositioned Multiple Pain Sites: No ADL: See FIM for current functional status  Therapy/Group: Individual Therapy  Merlin Golden OTR/L 08/12/2013, 12:16 PM

## 2013-08-12 NOTE — Progress Notes (Signed)
Physical Therapy Session Note  Patient Details  Name: Gabriel [name] MRN: [id number] Date of Birth: 1941-07-26  Today's Date: 08/12/2013 Time: 1330-1420 Time Calculation (min): 50 min  Short Term Goals: Week 1:  PT Short Term Goal 1 (Week 1): Pt will perform bed mobility at min assist with attention to L arm during rolling PT Short Term Goal 2 (Week 1): Pt will perform squat pivot transfers at mod assist.  PT Short Term Goal 3 (Week 1): Pt will perform w/c mobility using R hemi technique x 50' at min assist to avoid obstacles on L side PT Short Term Goal 4 (Week 1): Pt will perform dynamic standing balance at max assist.  PT Short Term Goal 5 (Week 1): Pt will ambulate x 20' w/ LRAD at max assist   Skilled Therapeutic Interventions/Progress Updates:   Pt received in bed sleeping with wife present.  He was easily aroused by voice and light touch.  Performed rolling to L side so that wife could rub [place] on pts lower back for pain relief.  Then performed L SL to sit at max assist to assist LLE out of bed and also to assist with elevating trunk into full upright position.  Continues to demonstrate increased L lateral lean in sitting with decreased awareness/problem solving to correct.  When asked "do you feel like you are leaning?" he states "only a little" when he is all the way down on L side.  Assisted pt with donning shoes due to time constraints.  Performed squat pivot transfer bed to chair at max assist with pt providing approx 60% of verbal instruction for completing task.  Continues to require cues for increased forward weight shift, forward lean, and hand placement for safe technique.  Assisted to gym to perform gait training.  See full details below.  Performed squat pivot transfer to/from mat table at max assist with same cues as before with pt able to state same percentage of task, however with cues for removal of L foot rest prior to transfer.  Performed single "lift off" with emphasis on  B hand placement on LLE for increased weight bearing through LLE.  Had pt self propel using R hemi technique x 50' with mod assist with max verbal, visual and auditory cuing for attending to L side and using RLE to steer to the R.  Left pt in room with quick release belt in place, wife present and all needs in reach.  Pts wife concerned about "working on pts jaw."  Discussed that SLP had provided oral motor exercises for pt to complete and that this clinician would speak with SLP regarding wife's concern.    Therapy Documentation Precautions:  Precautions Precautions: Fall Precaution Comments: pusher, left hemiparesis with decreased motor planning Restrictions Weight Bearing Restrictions: No   Pain: Pain Assessment Pain Assessment: Faces Faces Pain Scale: Hurts little more Pain Type: Chronic pain Pain Location: Back Pain Intervention(s): Repositioned Multiple Pain Sites: No   Locomotion : Ambulation Ambulation: Yes Ambulation/Gait Assistance: 1: +2 Total assist Ambulation Distance (Feet): 32 Feet (x 2 reps then another 53') Assistive device:  (handrail on R and therapist support on L w/ chair follow) Ambulation/Gait Assistance Details: Verbal cues for sequencing;Verbal cues for technique;Verbal cues for precautions/safety;Verbal cues for gait pattern;Manual facilitation for weight shifting;Manual facilitation for placement;Manual facilitation for weight bearing Ambulation/Gait Assistance Details: Assisted pt to hallway in order to begin gait training with use of R  handrail and therapist support on L with LUE supported over therapist  shoulders.  Performed distances mentioned above at max assist (+2 for chair follow) with verbal and visual cues for upright posture and keeping eye on target in hallway for midline orientation.  Also provided manual facilitation for increased weight bearing through LLE during stance and intermittent tapping to L quad for increased knee extension during stance.   Provided verbal and demonstration cues for increased step width and heel contact first during gait.  Pt demonstrates decreased recall of cues and requires continued cues throughout for technique.   Gait Gait: Yes Gait Pattern: Impaired Gait Pattern: Decreased stance time - left;Decreased weight shift to left;Step-to pattern;Ataxic;Trunk flexed;Lateral trunk lean to left;Left flexed knee in stance;Narrow base of support Wheelchair Mobility Distance: 17'   See FIM for current functional status  Therapy/Group: Individual Therapy  [name] 08/12/2013, 3:34 PM

## 2013-08-12 NOTE — Progress Notes (Signed)
Physical Therapy Session Note  Patient Details  Name: Gabriel [name] MRN: [id number] Date of Birth: 11/01/1940  Today's Date: 08/12/2013 Time: 0830-0920 Time Calculation (min): 50 min  Short Term Goals: Week 1:  PT Short Term Goal 1 (Week 1): Pt will perform bed mobility at min assist with attention to L arm during rolling PT Short Term Goal 2 (Week 1): Pt will perform squat pivot transfers at mod assist.  PT Short Term Goal 3 (Week 1): Pt will perform w/c mobility using R hemi technique x 50' at min assist to avoid obstacles on L side PT Short Term Goal 4 (Week 1): Pt will perform dynamic standing balance at max assist.  PT Short Term Goal 5 (Week 1): Pt will ambulate x 20' w/ LRAD at max assist   Skilled Therapeutic Interventions/Progress Updates:   Pt received in bed this morning, but hesitant to participate in therapy, stating "I'll get up sometime today."  Educated that this clinician with PT and it is time to get OOB and start first therapy of the day.  He was then agreeable.  Performed supine <> sit as mentioned below with log rolling technique.  Also performed several times during session to increase procedural memory of process.  Performed squat pivot transfer to R x 2 reps at max assist with questioning cues for correct set up of w/c, ensuring brakes locked, scooting to edge of chair and hand placement.  Continues to require max cues for safety as he demonstrates decreased problem solving and awareness of deficits.  Provided manual facilitation for forward weight shift, lateral weight shift to scoot hips to edge of chair, and also for increased WB through LLE.  Also performed transfer to L x 1 rep at max assist with same cues mentioned above.  Performed NMR task to increased postural control, orientation to midline, reaching R and L to decrease pusher tendencies and also to increased WB through L hip.  See full details below.  SLP to gym to assist pt to next therapy session.    Therapy  Documentation Precautions:  Precautions Precautions: Fall Precaution Comments: pusher, left hemiparesis with decreased motor planning Restrictions Weight Bearing Restrictions: No   Pain: Pain Assessment Pain Assessment: No/denies pain Mobility: Bed Mobility Bed Mobility: Rolling Right;Right Sidelying to Sit;Sit to Sidelying Right Rolling Right: 3: Mod assist Rolling Right Details: Verbal cues for technique;Verbal cues for precautions/safety;Manual facilitation for weight shifting;Manual facilitation for weight bearing;Manual facilitation for placement;Verbal cues for sequencing Rolling Right Details (indicate cue type and reason): Pt very anxious and resistant to rolling R due to pusher tendencies.  Requires min assist to adjust legs and tactile cues to rotate pelvis R and also to bring L UE/trunk with LEs during roll.  Right Sidelying to Sit: 3: Mod assist Right Sidelying to Sit Details: Verbal cues for sequencing;Verbal cues for technique;Verbal cues for precautions/safety;Manual facilitation for weight shifting;Manual facilitation for placement;Manual facilitation for weight bearing Right Sidelying to Sit Details (indicate cue type and reason): Requires assist for LLE out of bed, however he was able to initiate movement and assist to some degree.  Provided verbal, demonstration and tactile cues for hand placement and to assist with LUE to elevate trunk into sitting.  Pt initially states "I can't", however once movement initiated at trunk, pt able to complete at mod assist.   Sit to Sidelying Right: 4: Min assist Sit to Sidelying Right Details: Verbal cues for technique;Verbal cues for precautions/safety;Manual facilitation for weight shifting;Manual facilitation for placement Sit to  Sidelying Right Details (indicate cue type and reason): Pt with increased resistance to going into R SL position due to pusher tendency with max verbal cues and demonstration for technique.  Requires assist for  LLE into bed, however pt unable to sustain R SL position throughout bed mobiltiy.  Transfers Transfers: Yes Sit to Stand: 2: Max assist Sit to Stand Details: Verbal cues for sequencing;Verbal cues for technique;Manual facilitation for weight shifting;Manual facilitation for weight bearing;Verbal cues for precautions/safety Stand to Sit: 2: Max assist Stand to Sit Details (indicate cue type and reason): Verbal cues for safe use of DME/AE;Verbal cues for technique;Manual facilitation for weight shifting;Manual facilitation for weight bearing;Verbal cues for precautions/safety Locomotion : Wheelchair Mobility Distance: 53'    Other Treatments: Treatments Therapeutic Activity: Performed dynamic sitting balance activity at edge of mat while reaching to diagonal and right in order to decrease pusher tendencies while returning to midline as much as possible and also reaching L to encourage trunk shortening and lengthening during session.  Also performed standing next to wall for visual and tactile feedback for lateral boundary while reaching for targets superiorly and diagonally.   Neuromuscular Facilitation: Right;Left;Lower Extremity;Forced use;Activity to increase coordination;Activity to increase motor control;Activity to increase sustained activation;Activity to increase lateral weight shifting;Activity to increase anterior-posterior weight shifting  See FIM for current functional status  Therapy/Group: Individual Therapy  [name] 08/12/2013, 10:43 AM

## 2013-08-12 NOTE — Progress Notes (Signed)
Pt. required I/O cath at 1500 after 8 hrs no void.  Assisted to BR, attempt to void, unable Scan at 1500 = 300cc, I/O cathed for 350cc.  Urine amber.  Continue PVR's.

## 2013-08-12 NOTE — Progress Notes (Signed)
Subjective/Complaints:   Review of Systems - Negative except left weakness  Objective: Vital Signs: Blood pressure 121/70, pulse 83, temperature 98.2 F (36.8 C), temperature source Oral, resp. rate 16, height 5\' 10"  (1.778 m), weight 67.994 kg (149 lb 14.4 oz), SpO2 98.00%. No results found. Results for orders placed during the hospital encounter of 08/10/13 (from the past 72 hour(s))  CBC WITH DIFFERENTIAL     Status: Abnormal   Collection Time    08/11/13  4:35 AM      Result Value Range   WBC 9.4  4.0 - 10.5 K/uL   RBC 2.60 (*) 4.22 - 5.81 MIL/uL   Hemoglobin 8.5 (*) 13.0 - 17.0 g/dL   HCT 16.1 (*) 09.6 - 04.5 %   MCV 97.7  78.0 - 100.0 fL   MCH 32.7  26.0 - 34.0 pg   MCHC 33.5  30.0 - 36.0 g/dL   RDW 40.9 (*) 81.1 - 91.4 %   Platelets 17 (*) 150 - 400 K/uL   Comment: CRITICAL VALUE NOTED.  VALUE IS CONSISTENT WITH PREVIOUSLY REPORTED AND CALLED VALUE.     CONSISTENT WITH PREVIOUS RESULT   Neutrophils Relative % 81 (*) 43 - 77 %   Neutro Abs 7.6  1.7 - 7.7 K/uL   Lymphocytes Relative 12  12 - 46 %   Lymphs Abs 1.1  0.7 - 4.0 K/uL   Monocytes Relative 7  3 - 12 %   Monocytes Absolute 0.6  0.1 - 1.0 K/uL   Eosinophils Relative 0  0 - 5 %   Eosinophils Absolute 0.0  0.0 - 0.7 K/uL   Basophils Relative 0  0 - 1 %   Basophils Absolute 0.0  0.0 - 0.1 K/uL  COMPREHENSIVE METABOLIC PANEL     Status: Abnormal   Collection Time    08/11/13  4:35 AM      Result Value Range   Sodium 132 (*) 135 - 145 mEq/L   Potassium 4.0  3.5 - 5.1 mEq/L   Chloride 101  96 - 112 mEq/L   CO2 20  19 - 32 mEq/L   Glucose, Bld 107 (*) 70 - 99 mg/dL   BUN 10  6 - 23 mg/dL   Creatinine, Ser 7.82  0.50 - 1.35 mg/dL   Calcium 7.8 (*) 8.4 - 10.5 mg/dL   Total Protein 5.6 (*) 6.0 - 8.3 g/dL   Albumin 2.5 (*) 3.5 - 5.2 g/dL   AST 24  0 - 37 U/L   ALT 7  0 - 53 U/L   Alkaline Phosphatase 83  39 - 117 U/L   Total Bilirubin 0.4  0.3 - 1.2 mg/dL   GFR calc non Af Amer >90  >90 mL/min   GFR calc Af  Amer >90  >90 mL/min   Comment: (NOTE)     The eGFR has been calculated using the CKD EPI equation.     This calculation has not been validated in all clinical situations.     eGFR's persistently <90 mL/min signify possible Chronic Kidney     Disease.     HEENT: normal Cardio: RRR and no murmur Resp: CTA B/L and unlabored GI: BS positive and non distended Extremity:  Pulses positive and No Edema Skin:   Intact Neuro: Alert/Oriented, Cranial Nerve Abnormalities Left central 7, Normal Sensory, Abnormal Motor 3-/5 Left delt , bi, tri, grip, 4- in Left HF, KE, ADF, Abnormal FMC Ataxic/ dec FMC and Dysarthric Musc/Skel:  Normal and Other no  pain with L shoulder ROM Gen NAD   Assessment/Plan: 1. Functional deficits secondary to Left hemiparesis R thrombotic periopercular CVA which require 3+ hours per day of interdisciplinary therapy in a comprehensive inpatient rehab setting. Physiatrist is providing close team supervision and 24 hour management of active medical problems listed below. Physiatrist and rehab team continue to assess barriers to discharge/monitor patient progress toward functional and medical goals. FIM: FIM - Bathing Bathing Steps Patient Completed: Chest;Left Arm;Abdomen;Right upper leg;Left upper leg Bathing: 1: Two helpers  FIM - Upper Body Dressing/Undressing Upper body dressing/undressing steps patient completed: Put head through opening of pull over shirt/dress;Thread/unthread left sleeve of pullover shirt/dress Upper body dressing/undressing: 3: Mod-Patient completed 50-74% of tasks FIM - Lower Body Dressing/Undressing Lower body dressing/undressing: 1: Two helpers  FIM - Toileting Toileting: 1: Two helpers  FIM - Archivist Transfers: 2-To toilet/BSC: Max A (lift and lower assist);2-From toilet/BSC: Max A (lift and lower assist)  FIM - Press photographer Assistive Devices: Arm rests Bed/Chair Transfer: 2: Supine > Sit: Max A  (lifting assist/Pt. 25-49%);2: Bed > Chair or W/C: Max A (lift and lower assist)  FIM - Locomotion: Wheelchair Distance: 55' Locomotion: Wheelchair: 2: Travels 50 - 149 ft with moderate assistance (Pt: 50 - 74%) FIM - Locomotion: Ambulation Locomotion: Ambulation Assistive Devices:  ("three muskateer style") Ambulation/Gait Assistance: 1: +2 Total assist Locomotion: Ambulation: 1: Two helpers  Comprehension Comprehension Mode: Auditory Comprehension: 4-Understands basic 75 - 89% of the time/requires cueing 10 - 24% of the time  Expression Expression Mode: Verbal Expression: 4-Expresses basic 75 - 89% of the time/requires cueing 10 - 24% of the time. Needs helper to occlude trach/needs to repeat words.  Social Interaction Social Interaction: 4-Interacts appropriately 75 - 89% of the time - Needs redirection for appropriate language or to initiate interaction.  Problem Solving Problem Solving: 3-Solves basic 50 - 74% of the time/requires cueing 25 - 49% of the time  Memory Memory: 4-Recognizes or recalls 75 - 89% of the time/requires cueing 10 - 24% of the time  Medical Problem List and Plan:  1. 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 DVT Prophylaxis/Anticoagulation: SCDs. Monitor for any signs of DVT  3. Pain Management/chronic back pain related to metastatic lung cancer: Duragesic patch as prior to admission. Monitor with increased mobility, norco 10/325 for breakthrough pain as well  4. Neuropsych: This patient is capable of making decisions on his own behalf.  5. Dysphagia. Dysphagia 2 nectar thick liquids. Monitor for any signs of aspiration. Followup speech therapy  6. Non-ST elevated myocardial infarction. Continue aspirin therapy. Followup cardiology services as needed. Low-dose beta blocker has been added  7. Thrombocytopenia. Aspirin has been held and her platelet count 50,000  higher. Followup CBC. Hematology services continues to follow  8. Metastatic lung cancer. Followup oncology services Dr. Clelia Croft and radiation oncology Dr. Kathrynn Running. Patient received his first radiation treatment 02/14/2013 in addition to 3 cycle of chemotherapy last given 08/04/2013  9. Hyperlipidemia. Lipitor  10.  Urinary retention I/O cath, add Flomax  LOS (Days) 2 A FACE TO FACE EVALUATION WAS PERFORMED  Jarvin Ogren E 08/12/2013, 7:56 AM

## 2013-08-12 NOTE — Progress Notes (Signed)
Nursing Note: No void in 8 hrs.Pt assisted to stand to void and was unable to.Bladder scan for 210 cc.R: Pt in and out cathed for 75 cc.Pt tolerated well.Pt encouraged to drink.Oral care provided and pt given water as pt is on water protocol.Pt drank x3 and each time after 2 short sips, pt began to cough and gag and had to spit secretions into a cup or napkin.Offered nectar thick liquids which pt did not like and drank only 2 sips.wbb

## 2013-08-12 NOTE — Progress Notes (Signed)
Nursing Note: Unable to void . Cathed for 600 cc . Pt tolerated well.wbb

## 2013-08-12 NOTE — Progress Notes (Signed)
Speech Language Pathology Daily Session Note  Patient Details  Name: Gabriel [name] MRN: [id number] Date of Birth: July 14, 1941  Today's Date: 08/12/2013 Time: 0920-1000  Time Calculation (min): 40 min  Short Term Goals: Week 1: SLP Short Term Goal 1 (Week 1): Patient will consume Dys.2 textures and thin liquids with Min verbal cues to recall and utilize safe swallow strategies to minimize s/s of aspiration. SLP Short Term Goal 2 (Week 1): Patient will perform pharyngeal strengthening exercises with Min verbal cues. SLP Short Term Goal 3 (Week 1): Patient will recall and utilize speech intelligibility strategies during a structured task with Min verbal cues. SLP Short Term Goal 4 (Week 1): Patient will demonstrate moderately-complex problem solving during functional task with Mod verbal cues. SLP Short Term Goal 5 (Week 1): Patient will label 2 physical and 2 cognitive deficits that resulted from CVA with Min question cues. SLP Short Term Goal 6 (Week 1): Patient will request help as needed with Mod question and verbal cues.  Skilled Therapeutic Interventions: Skilled treatment session focused on addressing education of speech and swallow strengthening exercises as well as cognitive goals.  SLP facilitated session with mirror, handout and Mod verbal cues to perform oral motor ROM and strengthening exercises as well as Mod verbal cues to complete pharyngeal strengthening exercise.  SLP also facilitated session with basic money management with Mod verbal cues due to working memory deficits and impaired self-monitoring and correcting of errors.  Continue with current plan of care.   FIM:  Comprehension Comprehension Mode: Auditory Comprehension: 5-Understands basic 90% of the time/requires cueing < 10% of the time Expression Expression Mode: Verbal Expression: 4-Expresses basic 75 - 89% of the time/requires cueing 10 - 24% of the time. Needs helper to occlude trach/needs to repeat words. Social  Interaction Social Interaction: 4-Interacts appropriately 75 - 89% of the time - Needs redirection for appropriate language or to initiate interaction. Problem Solving Problem Solving: 3-Solves basic 50 - 74% of the time/requires cueing 25 - 49% of the time Memory Memory: 4-Recognizes or recalls 75 - 89% of the time/requires cueing 10 - 24% of the time FIM - Eating Eating Activity: 5: Supervision/cues;4: Helper checks for pocketed food  Pain Pain Assessment Pain Assessment: No/denies pain  Therapy/Group: Individual Therapy  Gabriel [name]., CCC-SLP [phone]  Gabriel [name] 08/12/2013, 10:27 AM

## 2013-08-12 NOTE — Progress Notes (Signed)
Pt assisted to BR in attempt to void; unable to do so; Scanned for 427cc, I/O cathed for 275cc;  Dark amber urine.  8 1/2 hour time frame since last cath.  Fluid intake very poor.  Observed wife giving pt. water ( on water protocol with SLP or RN only)  Pt coughing with sips, prompted large amt emesis.  Reviewed precautions with pt and wife; SLP aware. Harvel Ricks PAC made aware of cath volumes, of urine concentration and emesis.

## 2013-08-13 ENCOUNTER — Inpatient Hospital Stay (HOSPITAL_COMMUNITY): Payer: Medicare Other | Admitting: Occupational Therapy

## 2013-08-13 ENCOUNTER — Inpatient Hospital Stay (HOSPITAL_COMMUNITY): Payer: Medicare Other | Admitting: Speech Pathology

## 2013-08-13 ENCOUNTER — Inpatient Hospital Stay (HOSPITAL_COMMUNITY): Payer: Medicare Other | Admitting: Physical Therapy

## 2013-08-13 DIAGNOSIS — I10 Essential (primary) hypertension: Secondary | ICD-10-CM

## 2013-08-13 DIAGNOSIS — D696 Thrombocytopenia, unspecified: Secondary | ICD-10-CM

## 2013-08-13 NOTE — Progress Notes (Signed)
Occupational Therapy Session Note  Patient Details  Name: Gabriel [name] MRN: [id number] Date of Birth: 02-24-1941  Today's Date: 08/13/2013 Time: 6213-0865 Time Calculation (min): 60 min  Skilled Therapeutic Interventions/Progress Updates: bed to chair transfer with Max A stand pivot;   toileting transfers via  W/c to grab bars = Max A;  Toileting= Total A;   Then ADL in w/c at sink: UB bathing= Mod A UB dressing= setup;  LB bathing and dressing = Max A x2   ;   Patient with much difficulty going sit to stand and required Max A to Total A x 2 to maintain standing for cleansing and dressing.       Patient very fatigued and able to stand for about 30 seconds each time before requesting to sit  Therapy Documentation Precautions:  Precautions Precautions: Fall Precaution Comments: pusher, left hemiparesis with decreased motor planning Restrictions Weight Bearing Restrictions: No   Pain:denied   See FIM for current functional status  Therapy/Group: Individual Therapy  Bud Face [facility] 08/13/2013, 12:55 PM

## 2013-08-13 NOTE — Progress Notes (Signed)
Occupational Therapy Session Note  Patient Details  Name: Gabriel [name] MRN: [id number] Date of Birth: [birth year]  Today's Date: 08/13/2013 Time: 7829-5621 Time Calculation (min): 45 min  Skilled Therapeutic Interventions/Progress Updates: Though patient  Extreme fatigue, he concurred to complete the following therapy to increase independence and function::  Bed mobility (Mod A required as patient c/o fatigue and with L LE hemiparesis);  Sit to stand= Min A to Mod A (after becoming more fatigued);   Edge of Bed push ups to increase overall endurance, motor control, LE strength and stability and UE strength= Min A and able to hold position for about 3-5 seconds without assist  Wife present and supportive and encouraged patient to participate in therapy though he initially dozed and c/o fatigue     Therapy Documentation Precautions:  Precautions Precautions: Fall Precaution Comments: pusher, left hemiparesis with decreased motor planning Restrictions Weight Bearing Restrictions: No Pain:  "low back" but did not rate and denied need for pain meds   See FIM for current functional status  Therapy/Group: Individual Therapy  Bud Face [facility] 08/13/2013, 1:48 PM

## 2013-08-13 NOTE — Progress Notes (Signed)
Patient ID: Gabriel [name], male   DOB: [birth year], 72 y.o.   MRN: [id number] Subjective/Complaints:  10/18.  72 y/o, h/o HTN admit with R MCA ischemic stroke. Patient has metastatic lung Ca, CAD  and mod severe anemia.   I&O cath after 12 hrs yielded only 175 cc and staff descibes poor PO intake; Pt c/o dry  mouth and being thirsty  Objective: Vital Signs: Blood pressure 135/70, pulse 88, temperature 98.4 F (36.9 C), temperature source Oral, resp. rate 17, height 5\' 10"  (1.778 m), weight 67.994 kg (149 lb 14.4 oz), SpO2 96.00%. No results found. Results for orders placed during the hospital encounter of 08/10/13 (from the past 72 hour(s))  CBC WITH DIFFERENTIAL     Status: Abnormal   Collection Time    08/11/13  4:35 AM      Result Value Range   WBC 9.4  4.0 - 10.5 K/uL   RBC 2.60 (*) 4.22 - 5.81 MIL/uL   Hemoglobin 8.5 (*) 13.0 - 17.0 g/dL   HCT 14.7 (*) 82.9 - 56.2 %   MCV 97.7  78.0 - 100.0 fL   MCH 32.7  26.0 - 34.0 pg   MCHC 33.5  30.0 - 36.0 g/dL   RDW 13.0 (*) 86.5 - 78.4 %   Platelets 17 (*) 150 - 400 K/uL   Comment: CRITICAL VALUE NOTED.  VALUE IS CONSISTENT WITH PREVIOUSLY REPORTED AND CALLED VALUE.     CONSISTENT WITH PREVIOUS RESULT   Neutrophils Relative % 81 (*) 43 - 77 %   Neutro Abs 7.6  1.7 - 7.7 K/uL   Lymphocytes Relative 12  12 - 46 %   Lymphs Abs 1.1  0.7 - 4.0 K/uL   Monocytes Relative 7  3 - 12 %   Monocytes Absolute 0.6  0.1 - 1.0 K/uL   Eosinophils Relative 0  0 - 5 %   Eosinophils Absolute 0.0  0.0 - 0.7 K/uL   Basophils Relative 0  0 - 1 %   Basophils Absolute 0.0  0.0 - 0.1 K/uL  COMPREHENSIVE METABOLIC PANEL     Status: Abnormal   Collection Time    08/11/13  4:35 AM      Result Value Range   Sodium 132 (*) 135 - 145 mEq/L   Potassium 4.0  3.5 - 5.1 mEq/L   Chloride 101  96 - 112 mEq/L   CO2 20  19 - 32 mEq/L   Glucose, Bld 107 (*) 70 - 99 mg/dL   BUN 10  6 - 23 mg/dL   Creatinine, Ser 6.96  0.50 - 1.35 mg/dL   Calcium 7.8 (*) 8.4 - 10.5  mg/dL   Total Protein 5.6 (*) 6.0 - 8.3 g/dL   Albumin 2.5 (*) 3.5 - 5.2 g/dL   AST 24  0 - 37 U/L   ALT 7  0 - 53 U/L   Alkaline Phosphatase 83  39 - 117 U/L   Total Bilirubin 0.4  0.3 - 1.2 mg/dL   GFR calc non Af Amer >90  >90 mL/min   GFR calc Af Amer >90  >90 mL/min   Comment: (NOTE)     The eGFR has been calculated using the CKD EPI equation.     This calculation has not been validated in all clinical situations.     eGFR's persistently <90 mL/min signify possible Chronic Kidney     Disease.    Patient Vitals for the past 24 hrs:  BP Temp Temp src Pulse Resp  SpO2  08/13/13 0534 135/70 mmHg 98.4 F (36.9 C) Oral 88 17 96 %  08/12/13 2047 137/73 mmHg - - 78 - -  08/12/13 1551 134/74 mmHg 98.3 F (36.8 C) Oral 82 17 98 %     Intake/Output Summary (Last 24 hours) at 08/13/13 0940 Last data filed at 08/13/13 0800  Gross per 24 hour  Intake    480 ml  Output    175 ml  Net    305 ml   General-alert no distress HEENT: normal Cardio: RRR and no murmur  No tachy Resp: CTA B/L and unlabored GI: BS positive and non distended Extremity:  Pulses positive and No Edema; SCDs in place Skin:   Intact Neuro: Alert/Oriented, Cranial Nerve Abnormalities Left central 7, Normal Sensory, Abnormal Motor 3-/5 Left delt , bi, tri, grip, 4- in Left HF, KE, ADF, Abnormal FMC Ataxic/ dec FMC and Dysarthric Musc/Skel:  Normal and Other no pain with L shoulder ROM Gen NAD   Assessment/Plan: 1. Functional deficits secondary to Left hemiparesis R thrombotic periopercular CVA which require 3+ hours per day of interdisciplinary therapy in a comprehensive inpatient rehab setting.   2. Dysphagia. Dysphagia 2 nectar thick liquids. Monitor for any signs of aspiration. Followup speech therapy; will watch I&O snf encourage PO intake 3.  Non-ST elevated myocardial infarction. Continue aspirin therapy. Followup cardiology services as needed. Low-dose beta blocker has been added  4. Thrombocytopenia.  Aspirin has been held and her platelet count 50,000 higher. Followup CBC. Hematology services continues to follow  5.  Metastatic lung cancer. Followup oncology services Dr. Clelia Croft and radiation oncology Dr. Kathrynn Running. Patient received his first radiation treatment 02/14/2013 in addition to 3 cycle of chemotherapy last given 08/04/2013   6.  Urinary retention I/O cath, add Flomax   LOS (Days) 3 A FACE TO FACE EVALUATION WAS PERFORMED  Rogelia Boga 08/13/2013, 9:34 AM

## 2013-08-13 NOTE — Progress Notes (Signed)
Physical Therapy Note  Patient Details  Name: Zong Mcquarrie MRN: [id number] Date of Birth: [birth year] Today's Date: 08/13/2013  1530-1625 (55 minutes) individual Pain: 6/10 low back/ left LE/ premedicated Focus of treatment: bed mobility training, transfer training, standing alignment/tolerance; gait training Treatment: Pt in bed upon arrival ; supine to left side to sit mod/max assist; transfer scoot mod assist ; wc mobility- 100 feet min assist with max vcs to attend to left ; sit to supine (mat) mod assist trunk , LT LE; AA left hip flexion/ extension, hip abduction; sit to stand to EVA walker mod/max assist; gait with EVA walker 25 feet mod/max assist , pt adducts LT LE , forward flexed posture and flexed left knee; gait along rail right- 30 feet mod assist with gait pattern as above (followed by wc). Pt returned to room with quick release belt in place ; pt to stay up for dinner ; wife present.    Vicente Weidler,JIM 08/13/2013, 3:48 PM

## 2013-08-13 NOTE — Progress Notes (Signed)
Speech Language Pathology Daily Session Note  Patient Details  Name: Gabriel [name] MRN: [id number] Date of Birth: 06-Mar-1941  Today's Date: 08/13/2013 Time: 4098-1191 Time Calculation (min): 55 min  Short Term Goals: Week 1: SLP Short Term Goal 1 (Week 1): Patient will consume Dys.2 textures and thin liquids with Min verbal cues to recall and utilize safe swallow strategies to minimize s/s of aspiration. SLP Short Term Goal 2 (Week 1): Patient will perform pharyngeal strengthening exercises with Min verbal cues. SLP Short Term Goal 3 (Week 1): Patient will recall and utilize speech intelligibility strategies during a structured task with Min verbal cues. SLP Short Term Goal 4 (Week 1): Patient will demonstrate moderately-complex problem solving during functional task with Mod verbal cues. SLP Short Term Goal 5 (Week 1): Patient will label 2 physical and 2 cognitive deficits that resulted from CVA with Min question cues. SLP Short Term Goal 6 (Week 1): Patient will request help as needed with Mod question and verbal cues.  Skilled Therapeutic Interventions: Therapeutic intervention complete, focusing on pharyngeal strengthening exercises, implementing speech intelligibility strategies, and consuming thin liquids without s/s of aspiration. Patient performed strengthening exercises with min A verbal cues, utilized speech strategies with min-mod verbal cues, and consumed thin liquids without s/s of aspiration.  He was encouraged to put cup to right side of mouth, take small sips, and try and swallow on right side as much as possible.  He was able to follow directions and had no cough, throat clear, or wet voice.    FIM:  Comprehension Comprehension: 5-Understands basic 90% of the time/requires cueing < 10% of the time FIM - Eating Eating Activity: 5: Supervision/cues  Pain Pain Assessment Pain Assessment: No/denies pain Pain Score: 3   Therapy/Group: Individual Therapy  Lenny Pastel 08/13/2013, 3:52 PM

## 2013-08-14 ENCOUNTER — Inpatient Hospital Stay (HOSPITAL_COMMUNITY): Payer: Medicare Other | Admitting: Physical Therapy

## 2013-08-14 DIAGNOSIS — I10 Essential (primary) hypertension: Secondary | ICD-10-CM

## 2013-08-14 LAB — URINALYSIS, ROUTINE W REFLEX MICROSCOPIC
Bilirubin Urine: NEGATIVE
Glucose, UA: NEGATIVE mg/dL
Ketones, ur: NEGATIVE mg/dL
Protein, ur: NEGATIVE mg/dL
pH: 5 (ref 5.0–8.0)

## 2013-08-14 LAB — URINE MICROSCOPIC-ADD ON

## 2013-08-14 MED ORDER — LIDOCAINE HCL 2 % EX GEL
Freq: Once | CUTANEOUS | Status: AC
  Administered 2013-08-14: 20:00:00 via URETHRAL
  Filled 2013-08-14: qty 5

## 2013-08-14 MED ORDER — FERROUS SULFATE 325 (65 FE) MG PO TABS
325.0000 mg | ORAL_TABLET | Freq: Once | ORAL | Status: AC
  Administered 2013-08-14: 325 mg via ORAL
  Filled 2013-08-14: qty 1

## 2013-08-14 NOTE — Progress Notes (Signed)
Patient ID: Gabriel [name], male   DOB: 1941/08/02, 72 y.o.   MRN: [id number] Patient ID: Gabriel [name], male   DOB: 03-25-1941, 72 y.o.   MRN: [id number] Subjective/Complaints:  10/19.  72 y/o, h/o HTN admit with R MCA ischemic stroke. Patient has metastatic lung Ca, CAD  and mod severe anemia.  Patient is much more alert today and his by mouth intake has improved nicely. No complaints this morning   Objective: Vital Signs: Blood pressure 110/60, pulse 84, temperature 98.1 F (36.7 C), temperature source Oral, resp. rate 18, height 5\' 10"  (1.778 m), weight 67.994 kg (149 lb 14.4 oz), SpO2 98.00%. No results found. No results found for this or any previous visit (from the past 72 hour(s)).  Patient Vitals for the past 24 hrs:  BP Temp Temp src Pulse Resp SpO2  08/14/13 0511 110/60 mmHg 98.1 F (36.7 C) Oral 84 18 98 %  08/13/13 2149 126/74 mmHg - - 62 - -  08/13/13 1534 130/71 mmHg 98 F (36.7 C) Oral 53 18 98 %     Intake/Output Summary (Last 24 hours) at 08/14/13 0905 Last data filed at 08/14/13 0645  Gross per 24 hour  Intake    240 ml  Output    975 ml  Net   -735 ml   General-alert no distress HEENT: normal  mucosal and membranes are moist Cardio: RRR and no murmur  No tachy Resp: CTA B/L and unlabored GI: BS positive and non distended Extremity:  Pulses positive and No Edema; SCDs in place Skin:   Intact Neuro: Alert/Oriented, Cranial Nerve Abnormalities Left central 7, Normal Sensory, Abnormal Motor with left hemiparesis     Assessment/Plan: 1. Functional deficits secondary to Left hemiparesis R thrombotic periopercular CVA which require 3+ hours per day of interdisciplinary therapy in a comprehensive inpatient rehab setting.   2. Dysphagia. Dysphagia 2 nectar thick liquids. Monitor for any signs of aspiration. Followup speech therapy; will watch I&O snf encourage PO intake 3.  Non-ST elevated myocardial infarction. Continue aspirin therapy. Followup cardiology services  as needed. Low-dose beta blocker has been added  4. Thrombocytopenia. Aspirin has been held and her platelet count 50,000 higher. Followup CBC. Hematology services continues to follow  5.  Metastatic lung cancer. Followup oncology services Dr. Clelia Croft and radiation oncology Dr. Kathrynn Running. Patient received his first radiation treatment 02/14/2013 in addition to 3 cycle of chemotherapy last given 08/04/2013   6.  Urinary retention I/O cath, add Flomax   LOS (Days) 4 A FACE TO FACE EVALUATION WAS PERFORMED  Rogelia Boga 08/14/2013, 9:05 AM

## 2013-08-14 NOTE — Progress Notes (Signed)
Physical Therapy Note  Patient Details  Name: Nijah Orlich MRN: [id number] Date of Birth: 1940-11-13 Today's Date: 08/14/2013  4098-1191 ( 40 minutes) individual Pain: 4/10 back/ premedicated Focus of treatment: therapeutic exercise focused on Lt LE strengthening/ control/ activity tolerance; standing tolerance/ alignment; wc mobility training Treatment: Pt in wc upon arrival; wc mobility 120 feet X 2 using RT extremities with multiple rest breaks ; pt continues to required mod to max vcs to avoid objects on left; transfers max assist for scoot to Nustep ; Nustep LEVEL 1 X 5 minutes with c/o pain left LE; sit to stand to bedside table mod assist; pt able to stand with forward flexed posture less than 2 minutes before sitting secondary to reported fatigue; returned to bed with max assist scoot transfer; bed alarm activated.    Shantelle Alles,JIM 08/14/2013, 11:22 AM

## 2013-08-15 ENCOUNTER — Inpatient Hospital Stay (HOSPITAL_COMMUNITY): Payer: Medicare Other | Admitting: Speech Pathology

## 2013-08-15 ENCOUNTER — Inpatient Hospital Stay (HOSPITAL_COMMUNITY): Payer: Medicare Other | Admitting: Occupational Therapy

## 2013-08-15 ENCOUNTER — Inpatient Hospital Stay (HOSPITAL_COMMUNITY): Payer: Medicare Other

## 2013-08-15 ENCOUNTER — Inpatient Hospital Stay (HOSPITAL_COMMUNITY): Payer: Medicare Other | Admitting: *Deleted

## 2013-08-15 ENCOUNTER — Inpatient Hospital Stay (HOSPITAL_COMMUNITY): Payer: Medicare Other | Admitting: Rehabilitation

## 2013-08-15 ENCOUNTER — Encounter (HOSPITAL_COMMUNITY): Payer: Medicare Other

## 2013-08-15 DIAGNOSIS — I6350 Cerebral infarction due to unspecified occlusion or stenosis of unspecified cerebral artery: Secondary | ICD-10-CM

## 2013-08-15 LAB — CBC WITH DIFFERENTIAL/PLATELET
Basophils Absolute: 0 10*3/uL (ref 0.0–0.1)
Eosinophils Absolute: 0 10*3/uL (ref 0.0–0.7)
Eosinophils Relative: 0 % (ref 0–5)
HCT: 22.4 % — ABNORMAL LOW (ref 39.0–52.0)
MCH: 32 pg (ref 26.0–34.0)
MCHC: 32.6 g/dL (ref 30.0–36.0)
MCV: 98.2 fL (ref 78.0–100.0)
Monocytes Absolute: 0.6 10*3/uL (ref 0.1–1.0)
Monocytes Relative: 7 % (ref 3–12)
Neutro Abs: 6.3 10*3/uL (ref 1.7–7.7)
Platelets: 13 10*3/uL — CL (ref 150–400)
RDW: 19.1 % — ABNORMAL HIGH (ref 11.5–15.5)

## 2013-08-15 LAB — BASIC METABOLIC PANEL
BUN: 10 mg/dL (ref 6–23)
CO2: 26 mEq/L (ref 19–32)
Calcium: 8.3 mg/dL — ABNORMAL LOW (ref 8.4–10.5)
Creatinine, Ser: 0.59 mg/dL (ref 0.50–1.35)
GFR calc non Af Amer: >90 mL/min (ref >90)
Potassium: 3.7 mEq/L (ref 3.5–5.1)
Sodium: 132 mEq/L — ABNORMAL LOW (ref 135–145)

## 2013-08-15 MED ORDER — GADOBENATE DIMEGLUMINE 529 MG/ML IV SOLN
15.0000 mL | Freq: Once | INTRAVENOUS | Status: AC
  Administered 2013-08-15: 14 mL via INTRAVENOUS

## 2013-08-15 MED ORDER — GADOBENATE DIMEGLUMINE 529 MG/ML IV SOLN: 13.0000 mL | Freq: Once | INTRAVENOUS | Status: AC | PRN

## 2013-08-15 NOTE — Plan of Care (Signed)
Problem: RH SKIN INTEGRITY Goal: RH OTHER STG SKIN INTEGRITY GOALS W/ASSIST Other STG Skin Integrity Goals With minimal Assistance.  Outcome: Not Progressing Requires total assist

## 2013-08-15 NOTE — Progress Notes (Signed)
Physical Therapy Session Note  Patient Details  Name: Gabriel [name] MRN: [id number] Date of Birth: 1940-12-09  Today's Date: 08/15/2013 Time: 1515-1600 Time Calculation (min): 45 min  Short Term Goals: Week 1:  PT Short Term Goal 1 (Week 1): Pt will perform bed mobility at min assist with attention to L arm during rolling PT Short Term Goal 2 (Week 1): Pt will perform squat pivot transfers at mod assist.  PT Short Term Goal 3 (Week 1): Pt will perform w/c mobility using R hemi technique x 50' at min assist to avoid obstacles on L side PT Short Term Goal 4 (Week 1): Pt will perform dynamic standing balance at max assist.  PT Short Term Goal 5 (Week 1): Pt will ambulate x 20' w/ LRAD at max assist   Skilled Therapeutic Interventions/Progress Updates:   Pt received lying in bed with wife present during start of session.  Performed rolling to L side at total assist and SL to sit at total assist with max cues for reaching across with RUE to handrail to self assist with rolling and then also to self assist in elevating trunk.  Note pt CVA has extended and deficits are increased since last time seeing pt.  Wife is aware.  Once in sitting, requires total assist to maintain upright posture and did not note any active trunk movements initially, however had pt perform reaching activity to R side in order to address increased pusher tendencies and did note some active trunk musculature contractions, however requires max assist to total at times when COG too far outside of BOS.  Performed several times for trunk shortening and lengthening, then pt states he needs to lie back down due to increased pain in low back and L side.  RN aware.  Assisted back into supine position at total assist and requires +2 assist to scoot towards HOB.  Left pt in bed with bed alarm set and all needs in reach.   Therapy Documentation Precautions:  Precautions Precautions: Fall Precaution Comments: pusher, left hemiparesis with  decreased motor planning Restrictions Weight Bearing Restrictions: No General:   Vital Signs: Therapy Vitals Temp: 97.5 F (36.4 C) Temp src: Oral Pulse Rate: 77 Resp: 18 BP: 144/72 mmHg Patient Position, if appropriate: Lying Oxygen Therapy SpO2: 97 % O2 Device: None (Room air) Pain: Pain Assessment Pain Assessment: 0-10 Pain Score: 8  Pain Type: Chronic pain Pain Location: Shoulder Pain Orientation: Left Pain Radiating Towards: back , legs  Pain Descriptors / Indicators: Aching Pain Frequency: Occasional Pain Onset: Gradual Patients Stated Pain Goal: 2 Pain Intervention(s): Medication (See eMAR) (vicodin 1 po)  See FIM for current functional status  Therapy/Group: Individual Therapy  [name] 08/15/2013, 4:59 PM

## 2013-08-15 NOTE — Progress Notes (Signed)
Speech Language Pathology Daily Session Note  Patient Details  Name: Gabriel [name] MRN: [id number] Date of Birth: 1940-12-25  Today's Date: 08/15/2013 Time: 0900-0915 Time Calculation (min): 15 min  Short Term Goals: Week 1: SLP Short Term Goal 1 (Week 1): Patient will consume Dys.2 textures and thin liquids with Min verbal cues to recall and utilize safe swallow strategies to minimize s/s of aspiration. SLP Short Term Goal 2 (Week 1): Patient will perform pharyngeal strengthening exercises with Min verbal cues. SLP Short Term Goal 3 (Week 1): Patient will recall and utilize speech intelligibility strategies during a structured task with Min verbal cues. SLP Short Term Goal 4 (Week 1): Patient will demonstrate moderately-complex problem solving during functional task with Mod verbal cues. SLP Short Term Goal 5 (Week 1): Patient will label 2 physical and 2 cognitive deficits that resulted from CVA with Min question cues. SLP Short Term Goal 6 (Week 1): Patient will request help as needed with Mod question and verbal cues.  Skilled Therapeutic Interventions: Patient missed 30 minutes of skilled SLP services due to being off floor for CT/MRI.  Skilled treatment session focused on diagnostic treatment of swallow function given extension of stroke per CT/MRI this am.  Patient declined Dys. 2 texture trials but consumed Dys.1 textures and nectar-thick liquids with Min verbal cues to perform safe swallow compensatory strategies with no overt s/s of aspiration.  Despite Dys.2 textures not being consumed SLP suspects that with full supervision and consistent cues to attend to left buccal pocketing and utilize multiple swallows per bite/sip it is safe to continue with current diet orders.     FIM:  Comprehension Comprehension Mode: Auditory Comprehension: 5-Understands basic 90% of the time/requires cueing < 10% of the time Expression Expression Mode: Verbal Expression: 4-Expresses basic 75 - 89% of  the time/requires cueing 10 - 24% of the time. Needs helper to occlude trach/needs to repeat words. Social Interaction Social Interaction: 4-Interacts appropriately 75 - 89% of the time - Needs redirection for appropriate language or to initiate interaction. Problem Solving Problem Solving: 3-Solves basic 50 - 74% of the time/requires cueing 25 - 49% of the time Memory Memory: 3-Recognizes or recalls 50 - 74% of the time/requires cueing 25 - 49% of the time FIM - Eating Eating Activity: 5: Supervision/cues  Pain Pain Assessment Pain Assessment: No/denies pain  Therapy/Group: Individual Therapy  Charlane Ferretti., CCC-SLP [phone]  Francess Mullen 08/15/2013, 12:13 PM

## 2013-08-15 NOTE — Progress Notes (Signed)
Followup MRI scans secondary to increased left-sided weakness shows extension of the infarction in the right hemisphere increased in size from 4 cm to 7 cm. Contact was made to neurology services plan remains no anti-coagulation at this time secondary to thrombocytopenia. All issues in regards to following scan were discussed with wife and ongoing plan of care

## 2013-08-15 NOTE — Progress Notes (Signed)
Subjective/Complaints: RN reports increased L sided weakness over Weekend Pt unaware of new def  Review of Systems - Negative except left weakness  Objective: Vital Signs: Blood pressure 144/68, pulse 87, temperature 97.8 F (36.6 C), temperature source Oral, resp. rate 18, height 5\' 10"  (1.778 m), weight 67.994 kg (149 lb 14.4 oz), SpO2 98.00%. No results found. Results for orders placed during the hospital encounter of 08/10/13 (from the past 72 hour(s))  URINALYSIS, ROUTINE W REFLEX MICROSCOPIC     Status: Abnormal   Collection Time    08/14/13  9:29 PM      Result Value Range   Color, Urine YELLOW  YELLOW   APPearance HAZY (*) CLEAR   Specific Gravity, Urine 1.016  1.005 - 1.030   pH 5.0  5.0 - 8.0   Glucose, UA NEGATIVE  NEGATIVE mg/dL   Hgb urine dipstick LARGE (*) NEGATIVE   Bilirubin Urine NEGATIVE  NEGATIVE   Ketones, ur NEGATIVE  NEGATIVE mg/dL   Protein, ur NEGATIVE  NEGATIVE mg/dL   Urobilinogen, UA 0.2  0.0 - 1.0 mg/dL   Nitrite NEGATIVE  NEGATIVE   Leukocytes, UA NEGATIVE  NEGATIVE  URINE MICROSCOPIC-ADD ON     Status: None   Collection Time    08/14/13  9:29 PM      Result Value Range   Squamous Epithelial / LPF RARE  RARE   WBC, UA 0-2  <3 WBC/hpf   RBC / HPF 7-10  <3 RBC/hpf   Bacteria, UA RARE  RARE  CBC WITH DIFFERENTIAL     Status: Abnormal   Collection Time    08/15/13  7:29 AM      Result Value Range   WBC 8.3  4.0 - 10.5 K/uL   RBC 2.28 (*) 4.22 - 5.81 MIL/uL   Hemoglobin 7.3 (*) 13.0 - 17.0 g/dL   HCT 01.0 (*) 27.2 - 53.6 %   MCV 98.2  78.0 - 100.0 fL   MCH 32.0  26.0 - 34.0 pg   MCHC 32.6  30.0 - 36.0 g/dL   RDW 64.4 (*) 03.4 - 74.2 %   Platelets 13 (*) 150 - 400 K/uL   Comment: CRITICAL VALUE NOTED.  VALUE IS CONSISTENT WITH PREVIOUSLY REPORTED AND CALLED VALUE.     CONSISTENT WITH PREVIOUS RESULT     REPEATED TO VERIFY   Neutrophils Relative % 76  43 - 77 %   Neutro Abs 6.3  1.7 - 7.7 K/uL   Lymphocytes Relative 17  12 - 46 %   Lymphs  Abs 1.4  0.7 - 4.0 K/uL   Monocytes Relative 7  3 - 12 %   Monocytes Absolute 0.6  0.1 - 1.0 K/uL   Eosinophils Relative 0  0 - 5 %   Eosinophils Absolute 0.0  0.0 - 0.7 K/uL   Basophils Relative 0  0 - 1 %   Basophils Absolute 0.0  0.0 - 0.1 K/uL  BASIC METABOLIC PANEL     Status: Abnormal   Collection Time    08/15/13  7:29 AM      Result Value Range   Sodium 132 (*) 135 - 145 mEq/L   Potassium 3.7  3.5 - 5.1 mEq/L   Chloride 98  96 - 112 mEq/L   CO2 26  19 - 32 mEq/L   Glucose, Bld 102 (*) 70 - 99 mg/dL   BUN 10  6 - 23 mg/dL   Creatinine, Ser 5.95  0.50 - 1.35 mg/dL  Calcium 8.3 (*) 8.4 - 10.5 mg/dL   GFR calc non Af Amer >90  >90 mL/min   GFR calc Af Amer >90  >90 mL/min   Comment: (NOTE)     The eGFR has been calculated using the CKD EPI equation.     This calculation has not been validated in all clinical situations.     eGFR's persistently <90 mL/min signify possible Chronic Kidney     Disease.     HEENT: normal Cardio: RRR and no murmur Resp: CTA B/L and unlabored GI: BS positive and non distended Extremity:  Pulses positive and No Edema Skin:   Intact Neuro: Alert/Oriented, Cranial Nerve Abnormalities Left central 7, Normal Sensory, Abnormal Motor 0/5 Left delt , bi, tri, grip, 0 in Left HF, KE, ADF, Abnormal FMC Ataxic/ dec FMC and Dysarthric, severe L neglect Musc/Skel:  Normal and Other no pain with L shoulder ROM Gen NAD   Assessment/Plan: 1. Functional deficits secondary to Left hemiparesis R thrombotic periopercular CVA which require 3+ hours per day of interdisciplinary therapy in a comprehensive inpatient rehab setting. Physiatrist is providing close team supervision and 24 hour management of active medical problems listed below. Physiatrist and rehab team continue to assess barriers to discharge/monitor patient progress toward functional and medical goals. FIM: FIM - Bathing Bathing Steps Patient Completed: Chest;Left Arm;Abdomen;Front perineal  area;Right upper leg;Left upper leg Bathing: 3: Mod-Patient completes 5-7 15f 10 parts or 50-74%  FIM - Upper Body Dressing/Undressing Upper body dressing/undressing steps patient completed: Thread/unthread right sleeve of pullover shirt/dresss;Pull shirt over trunk;Put head through opening of pull over shirt/dress;Thread/unthread left sleeve of pullover shirt/dress Upper body dressing/undressing: 5: Set-up assist to: Obtain clothing/put away FIM - Lower Body Dressing/Undressing Lower body dressing/undressing steps patient completed: Thread/unthread right underwear leg Lower body dressing/undressing: 1: Total-Patient completed less than 25% of tasks  FIM - Toileting Toileting: 1: Two helpers  FIM - Diplomatic Services operational officer Devices: Therapist, music Transfers: 3-To toilet/BSC: Mod A (lift or lower assist);3-From toilet/BSC: Mod A (lift or lower assist)  FIM - Bed/Chair Transfer Bed/Chair Transfer Assistive Devices: Arm rests Bed/Chair Transfer: 2: Supine > Sit: Max A (lifting assist/Pt. 25-49%);2: Bed > Chair or W/C: Max A (lift and lower assist);3: Chair or W/C > Bed: Mod A (lift or lower assist)  FIM - Locomotion: Wheelchair Distance: 50' Locomotion: Wheelchair: 2: Travels 50 - 149 ft with moderate assistance (Pt: 50 - 74%) FIM - Locomotion: Ambulation Locomotion: Ambulation Assistive Devices:  (handrail in hallway) Ambulation/Gait Assistance: 1: +2 Total assist Locomotion: Ambulation: 1: Two helpers (chair follow)  Comprehension Comprehension Mode: Auditory Comprehension: 5-Follows basic conversation/direction: With extra time/assistive device  Expression Expression Mode: Verbal Expression: 4-Expresses basic 75 - 89% of the time/requires cueing 10 - 24% of the time. Needs helper to occlude trach/needs to repeat words.  Social Interaction Social Interaction: 4-Interacts appropriately 75 - 89% of the time - Needs redirection for appropriate language or to  initiate interaction.  Problem Solving Problem Solving: 3-Solves basic 50 - 74% of the time/requires cueing 25 - 49% of the time  Memory Memory: 4-Recognizes or recalls 75 - 89% of the time/requires cueing 10 - 24% of the time  Medical Problem List and Plan:  1. 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 DVT Prophylaxis/Anticoagulation: SCDs. Monitor  for any signs of DVT  3. Pain Management/chronic back pain related to metastatic lung cancer: Duragesic patch as prior to admission. Monitor with increased mobility, norco 10/325 for breakthrough pain as well  4. Neuropsych: This patient is capable of making decisions on his own behalf.  5. Dysphagia. Dysphagia 2 nectar thick liquids. Monitor for any signs of aspiration. Followup speech therapy  6. Non-ST elevated myocardial infarction. off aspirin therapy. Followup cardiology services as needed. Low-dose beta blocker has been added  7. Thrombocytopenia. Aspirin has been held and platelet count 50,000 higher. Followup CBC. Hematology services continues to follow  8. Metastatic lung cancer. Followup oncology services Dr. Clelia Croft and radiation oncology Dr. Kathrynn Running. Patient received his first radiation treatment 02/14/2013 in addition to 3 cycle of chemotherapy last given 08/04/2013  9. Hyperlipidemia. Lipitor  10.  Urinary retention I/O cath, add Flomax 12.  Anemia, check stool guaic, need heme f/u, has worsened thrombocytopenia but off all anticougulants LOS (Days) 5 A FACE TO FACE EVALUATION WAS PERFORMED  Dillan Lunden E 08/15/2013, 9:16 AM

## 2013-08-15 NOTE — Progress Notes (Signed)
Pt is having difficulty with gripping with his left hand or moving his left leg. Pts family stated that patient was moving both upper and lower extremities on the left side a few days ago. When asked, patient is unable to grip his left hand. Patient states he feels ok, just very tired. Will continue to monitor.

## 2013-08-15 NOTE — Progress Notes (Signed)
Occupational Therapy Session Note  Patient Details  Name: Gabriel [name] MRN: [id number] Date of Birth: 07/20/1941  Today's Date: 08/15/2013 Time: 1133-1200 Time Calculation (min): 27 min  Short Term Goals: Week 1:  OT Short Term Goal 1 (Week 1): Pt will perform LB bathing sit to stand with mod assist. OT Short Term Goal 2 (Week 1): Pt will donn a pullover shirt with supervision. OT Short Term Goal 3 (Week 1): Pt will maintain dynamic sitting balance with no more than min assist when attempting to donn LB clothing. OT Short Term Goal 4 (Week 1): Pt will perform toilet transfer to handicapped toilet or 3:1 with mod assist. OT Short Term Goal 5 (Week 1): Pt will use the LUE for bathing tasks with mod assist.  Skilled Therapeutic Interventions/Progress Updates:    Pt seen for 1:1 OT with focus on transfers, sitting balance, and Lt attention.  Pt in w/c upon arrival and previous OT reports pt requiring increased physical assistance with all mobility.  Returned pt to seated EOB with focus on sequencing transfer.  Verbal cues to weight shift forward to which pt demonstrated LOB forward with complete flexion forward, requiring total assist to manage trunk.  Squat pivot transfer to bed with +2 for safety due to decreased trunk control and participation with mobility.  Attempted to engage in reaching task while in unsupported sitting EOB, however pt either with posterior lean or anterior when overcorrecting - unable to maintain sitting balance without mod-total physical assistance.  Returned to supine in bed with total assist +2 for rolling to increase positioning in bed.  RN notified of change and need for +2 with all mobility.  Therapy Documentation Precautions:  Precautions Precautions: Fall Precaution Comments: pusher, left hemiparesis with decreased motor planning Restrictions Weight Bearing Restrictions: No Pain: Pain Assessment Pain Assessment: No/denies pain  See FIM for current  functional status  Therapy/Group: Individual Therapy  Rosalio Loud 08/15/2013, 1:05 PM

## 2013-08-15 NOTE — IPOC Note (Signed)
Overall Plan of Care [facility]) Patient Details Name: Gabriel [name] MRN: [id number] DOB: [birth year]  Admitting Diagnosis: CVA MI LUNG CA  Hospital Problems: Principal Problem:   CVA (cerebral infarction)  right MCA infarct with extension Left hemiplegia Severe left neglect Thrombocytopenia Anemia    Functional Problem List: Nursing Behavior  PT Balance;Endurance;Motor;Perception;Safety;Sensory  OT Balance;Cognition;Endurance;Motor;Pain;Perception;Safety;Sensory;Vision  SLP Cognition;Linguistic;Safety  TR         Basic ADL's: OT Eating;Grooming;Bathing;Dressing;Toileting     Advanced  ADL's: OT       Transfers: PT Bed Mobility;Bed to Chair;Car  OT Toilet;Tub/Shower     Locomotion: PT Ambulation;Wheelchair Mobility;Stairs     Additional Impairments: OT Fuctional Use of Upper Extremity  SLP Swallowing;Communication expression    TR      Anticipated Outcomes Item Anticipated Outcome  Self Feeding modified independent  Swallowing  Supervision   Basic self-care  min assist   Toileting  min assist   Bathroom Transfers min assist  Bowel/Bladder     Transfers  min assist   Locomotion  min assist  Communication  Supervision  Cognition  Min Assist  Pain     Safety/Judgment      Therapy Plan: PT Intensity: Minimum of 1-2 x/day ,45 to 90 minutes PT Frequency: 5 out of 7 days PT Duration Estimated Length of Stay: 3 weeks OT Intensity: Minimum of 1-2 x/day, 45 to 90 minutes OT Frequency: 5 out of 7 days OT Duration/Estimated Length of Stay: 3 weeks SLP Intensity: Minumum of 1-2 x/day, 30 to 90 minutes SLP Frequency: 5 out of 7 days SLP Duration/Estimated Length of Stay: 2 to 3 weeks       Team Interventions: Nursing Interventions    PT interventions Ambulation/gait training;Balance/vestibular training;Cognitive remediation/compensation;Discharge planning;DME/adaptive equipment instruction;Functional electrical stimulation;Functional mobility  training;Neuromuscular re-education;Patient/family education;Splinting/orthotics;Stair training;Therapeutic Activities;Therapeutic Exercise;UE/LE Strength taining/ROM;UE/LE Coordination activities;Visual/perceptual remediation/compensation;Wheelchair propulsion/positioning  OT Interventions Balance/vestibular training;Cognitive remediation/compensation;Community reintegration;Discharge planning;Functional mobility training;DME/adaptive equipment instruction;Neuromuscular re-education;Pain management;Patient/family education;Psychosocial support;Therapeutic Activities;Splinting/orthotics;Self Care/advanced ADL retraining;Therapeutic Exercise;UE/LE Strength taining/ROM;UE/LE Coordination activities;Visual/perceptual remediation/compensation  SLP Interventions Cognitive remediation/compensation;Cueing hierarchy;Dysphagia/aspiration precaution training;Functional tasks;Internal/external aids;Oral motor exercises;Patient/family education;Speech/Language facilitation;Therapeutic Exercise;Therapeutic Activities  TR Interventions    SW/CM Interventions Discharge Planning;Psychosocial Support;Patient/Family Education    Team Discharge Planning: Destination: PT-Home ,OT- Home , SLP-Home Projected Follow-up: PT-Home health PT;24 hour supervision/assistance, OT-   , SLP-24 hour supervision/assistance Projected Equipment Needs: PT-Wheelchair cushion (measurements);Wheelchair (measurements), OT- Tub/shower bench, SLP-None recommended by SLP Patient/family involved in discharge planning: PT- Patient,  OT-Patient, SLP-Patient  MD ELOS: 3-4 weeks  Medical Rehab Prognosis:  Guarded Assessment: 72 year old male admitted for right MCA infarct. Had left-sided weakness but had antigravity strength. On the morning of 08/15/2013 developed increased weakness now is 0/5 in the left upper and left lower extremity. Swallow re\evaluations shows no changes He is requiring inpatient rehabilitation, with PT, OT, and speech therapy  as well as 24 7 rehabilitation RN and MD. Goals will need to be downgraded to min mod assist level   See Team Conference Notes for weekly updates to the plan of care

## 2013-08-15 NOTE — Progress Notes (Signed)
Occupational Therapy Note  Patient Details  Name: Aarian Griffie MRN: [id number] Date of Birth: [birth year] Today's Date: 08/15/2013 Time:1015- 1130  ( ) Pain: back,leg (left), shoulders.   8 /10  RN aware Individual session  Pt lying in bed upon OT arrival.  Addressed bed mobility, sitting balance EOB, transfers to wc, standing at sink, endurance, attending to the left side.  Pt. Was total assist with rolling and side lying to sit.   Sat on EOB for pt to et balance.  Pt pushing to his left side with his right arm.  Unable to maintain upright posture.  Sits with increased trunk, head,neck flexion and hypotonic through trunk.  Transferred total assist +2 from bed to wc.  Unable to sit upright in wc; kept falling forward.  Needed plus 2 for sit to stand for peri care.  Left in wc with safety belt on and with next therapist.   PA reported in chart that pt had extended his stroke sometime early Monday morning.     Humberto Seals 08/15/2013, 10:34 AM

## 2013-08-15 NOTE — Progress Notes (Signed)
Inserted 14 F foley catheter.  Patient tolerated well. Draining amber colored urine.

## 2013-08-15 NOTE — Plan of Care (Signed)
Problem: RH BOWEL ELIMINATION Goal: RH STG MANAGE BOWEL WITH ASSISTANCE STG Manage Bowel with minimal Assistance.  Outcome: Not Progressing Pt unable to void, foley inserted  Problem: RH BLADDER ELIMINATION Goal: RH STG MANAGE BLADDER WITH ASSISTANCE STG Manage Bladder With Minimal Assistance  Outcome: Not Progressing Foley inserted, unable to void

## 2013-08-15 NOTE — Plan of Care (Signed)
Problem: RH BLADDER ELIMINATION Goal: RH STG MANAGE BLADDER WITH ASSISTANCE STG Manage Bladder With Minimal Assistance  Outcome: Not Progressing Foley intact; requires total assist to manage. Goal: RH STG MANAGE BLADDER WITH MEDICATION WITH ASSISTANCE STG Manage Bladder With Medication With minimal Assistance.  Outcome: Not Progressing Foley intact; requires total assist to manage. Goal: RH STG MANAGE BLADDER WITH EQUIPMENT WITH ASSISTANCE STG Manage Bladder With Equipment With minimal Assistance  Outcome: Not Progressing Foley intact; requires total assist to manage. Goal: RH OTHER STG BLADDER ELIMINATION GOALS W/ASSIST Other STG Bladder Elimination Goals With minimal Assistance  Outcome: Not Progressing Foley intact; requires total assist to manage.  Problem: RH SKIN INTEGRITY Goal: RH STG MAINTAIN SKIN INTEGRITY WITH ASSISTANCE STG Maintain Skin Integrity With Minimal Assistance.  Outcome: Not Progressing Patient requires total assist to manage skin care.

## 2013-08-16 ENCOUNTER — Inpatient Hospital Stay (HOSPITAL_COMMUNITY): Payer: Medicare Other | Admitting: Speech Pathology

## 2013-08-16 ENCOUNTER — Inpatient Hospital Stay (HOSPITAL_COMMUNITY): Payer: Medicare Other

## 2013-08-16 ENCOUNTER — Inpatient Hospital Stay (HOSPITAL_COMMUNITY): Payer: Medicare Other | Admitting: Occupational Therapy

## 2013-08-16 ENCOUNTER — Inpatient Hospital Stay (HOSPITAL_COMMUNITY): Payer: Medicare Other | Admitting: Rehabilitation

## 2013-08-16 ENCOUNTER — Encounter (HOSPITAL_COMMUNITY): Payer: Medicare Other | Admitting: Occupational Therapy

## 2013-08-16 DIAGNOSIS — G8110 Spastic hemiplegia affecting unspecified side: Secondary | ICD-10-CM

## 2013-08-16 DIAGNOSIS — I6330 Cerebral infarction due to thrombosis of unspecified cerebral artery: Secondary | ICD-10-CM

## 2013-08-16 LAB — ABO/RH: ABO/RH(D): A POS

## 2013-08-16 LAB — PREPARE RBC (CROSSMATCH)

## 2013-08-16 MED ORDER — FENTANYL 25 MCG/HR TD PT72: 75.0000 ug | MEDICATED_PATCH | TRANSDERMAL | Status: DC

## 2013-08-16 MED ORDER — FUROSEMIDE 10 MG/ML IJ SOLN
20.0000 mg | Freq: Once | INTRAMUSCULAR | Status: AC
Start: 2013-08-16 — End: 2013-08-17
  Administered 2013-08-17: 20 mg via INTRAVENOUS
  Filled 2013-08-16: qty 2

## 2013-08-16 MED ORDER — ACETAMINOPHEN 325 MG PO TABS
650.0000 mg | ORAL_TABLET | Freq: Once | ORAL | Status: AC
  Administered 2013-08-16: 650 mg via ORAL
  Filled 2013-08-16: qty 2

## 2013-08-16 MED ORDER — FENTANYL 25 MCG/HR TD PT72
75.0000 ug | MEDICATED_PATCH | TRANSDERMAL | Status: DC
  Administered 2013-08-16 – 2013-08-22 (×3): 75 ug via TRANSDERMAL
  Filled 2013-08-16 (×3): qty 3

## 2013-08-16 NOTE — Consult Note (Signed)
NEUROBEHAVIORAL STATUS EXAM - [facility]   MEDICAL NECESSITY:  Mr. Gabriel [name] was seen on the [facility] for a neurobehavioral status exam owing to the patient's diagnosis of stroke.   According to medical records, Mr. Gabriel [name] was admitted to the rehab unit owing to functional deficits subsequent to a right thrombotic periopercular infarct. In addition, records indicated that his stroke has now extended to involve most of the right middle cerebral artery distribution causing increased left hemiparesis and left neglect.   During today's visit, Mr. and Mrs. Gabriel [name] reported noticing sudden onset cognitive deficits following the stroke. They described memory loss and diminished attention-related skills. Left side neglect has been observed as well as right upper and lower extremity motor deficit. He reports trouble with word generation and word finding. His speech is also mildly dysarthric. Pain was the patient's chief complaint, and he said that his pain was an "8 or 9" out of 10 last night. This has been keeping him from sleeping well. He mentioned that he is supposed to be getting one 100mg  and one 25mg  fentanyl patch and he has only been getting one 25mg  patch at present.   No issues with anxiety or depression were endorsed. No adjustment issues. The patient has a good social support system and he feels that he is making strides in therapy.   PROCEDURES ADMINISTERED: [2 units [id number] on 08/15/13] Diagnostic clinical interview  Review of available records Mental Status Exam-2 (brief version)  MENTAL STATUS: Mr. Gabriel [name]'s brief mental status exam score of 12/16 is below the cutoff used to indicate cognitive impairment. He was able to immediate repeat 3 words but could only recall one of them freely after a very short delay. With cueing, he recognized the other two. He was disoriented to the date and the county.   Emotional &  Behavioral Evaluation: Mr. Gabriel [name] was appropriately dressed for season and situation. He exhibited left hemiparesis and was mildly dysarthric.  He was friendly and rapport was adequately established. He had trouble generating words at times but he could generally express ideas effectively. He seemed to understand test directions readily. His affect was flat. Attention and motivation were good. Optimal test taking conditions were maintained.  From an emotional standpoint, Mr. Gabriel [name] denied experiencing any chronic issues with depression or anxiety, though his mood is adversely affected by significant pain. No issues adjusting to his hospital stay were reported. Suicidal/homicidal ideation, plan or intent was denied. No manic or hypomanic episodes were reported. The patient denied ever experiencing any auditory/visual hallucinations. No major behavioral or personality changes were endorsed.    Overall, Mr. Gabriel [name] appears to be experiencing significant cognitive deficit secondary to stroke. These issues require further investigation and as such he is scheduled for more thorough neuropsychological screen with my colleague, Dr. Wylene Simmer. More detailed recommendations can be provided at that time. While no major issues with depression or anxiety were reported, severe pain adversely impacts mood and should be addressed by his providing physician.   In light of these findings, the following recommendations are provided.    RECOMMENDATIONS  Recommendations for treatment team:    Complete more thorough neuropsychological screen. This is schedule for this Thursday with Dr. Wylene Simmer.    Address pain management issues with his providing physician. This was discussed with the patient's social worker and she said she would talk to his doctor.    DIAGNOSIS:  Cerebral infarction   Debbe Mounts, Psy.D.  Clinical Neuropsychologist

## 2013-08-16 NOTE — Progress Notes (Signed)
[facility] Cancellation Note  Patient Details Name: Gabriel [name] MRN: [id number] DOB: September 12, 1941   Cancelled treatment: Pt missed 30 minutes of skilled SLP/OT intervention in [facility] due to off unit for procedure.    Evanny Ellerbe 08/16/2013, 2:19 PM

## 2013-08-16 NOTE — Progress Notes (Signed)
NUTRITION FOLLOW-UP  DOCUMENTATION CODES Per approved criteria  -Severe malnutrition in the context of chronic illness   INTERVENTION: Continue Ensure Complete po BID, each supplement provides 350 kcal and 13 grams of protein. Please thicken to appropriate consistency. Please provide yogurt or pudding with medications. If oral intake does not improve, recommend initiation of short-term nutrition support if within goals of care. RD to continue to follow nutrition care plan.  NUTRITION DIAGNOSIS: Increased nutrient needs related to catabolic illness as evidenced by estimated needs.   Goal: Intake to meet >90% of estimated nutrition needs.  Monitor:  weight trends, lab trends, I/O's, PO intake, supplement tolerance, diet rx per SLP  ASSESSMENT: PMHx significant for HTN, chronic back pain, metastatic lung CA and ongoing chemo/radiation therapy. Admitted to acute hospital on 10/11 with acute nonhemorrhagic infarct. Admitted to rehab on 10/15.  MBSS completed 10/12 with recommendations for Dysphagia 2 diet with Nectar-Thickened Liquids. Remains on this diet presently. Eating poor, consuming 25% of meals. Wife at bedside confirms that oral intake is poor.  Pt with very poor fluid intake. Pt with increased L-sided weakness over weekend. Follow-up MRI reveals extension of infarct. Pt now with palliative care consult.  Pt meets criteria for severe MALNUTRITION in the context of chronic illness as evidenced by severe fat and muscle mass loss.  Height: Ht Readings from Last 1 Encounters:  08/10/13 5\' 10"  (1.778 m)    Weight: Wt Readings from Last 1 Encounters:  08/10/13 149 lb 14.4 oz (67.994 kg)   BMI:  Body mass index is 21.51 kg/(m^2). WNL  Estimated Nutritional Needs: Kcal: 2000 - 2300 Protein: 87 - 100 g  Fluid:  approx 2 liters daily  Skin: intact  Diet Order: Dysphagia 2; Nectar Liquids  EDUCATION NEEDS: -No education needs identified at this time   Intake/Output  Summary (Last 24 hours) at 08/16/13 1354 Last data filed at 08/16/13 0945  Gross per 24 hour  Intake    340 ml  Output    600 ml  Net   -260 ml    Last BM: 10/14   Labs:   Recent Labs Lab 08/11/13 0435 08/15/13 0729  NA 132* 132*  K 4.0 3.7  CL 101 98  CO2 20 26  BUN 10 10  CREATININE 0.55 0.59  CALCIUM 7.8* 8.3*  GLUCOSE 107* 102*    CBG (last 3)  No results found for this basename: GLUCAP,  in the last 72 hours  Scheduled Meds: . acetaminophen  650 mg Oral Once  . atorvastatin  80 mg Oral q1800  . calcium-vitamin D  1 tablet Oral Q breakfast  . feeding supplement (ENSURE COMPLETE)  237 mL Oral BID BM  . [START ON 08/17/2013] fentaNYL  75 mcg Transdermal Q72H  . fluticasone  1 spray Each Nare Daily  . loratadine  10 mg Oral Daily  . metoprolol tartrate  12.5 mg Oral BID  . multivitamin with minerals  1 tablet Oral Daily  . pantoprazole  40 mg Oral QHS  . tamsulosin  0.4 mg Oral QPC supper  . timolol  1 drop Both Eyes Daily  . vitamin C  500 mg Oral Daily    Continuous Infusions:   Jarold Motto MS, RD, LDN Pager: [phone] After-hours pager: [phone]

## 2013-08-16 NOTE — Plan of Care (Signed)
Problem: RH BLADDER ELIMINATION Goal: RH STG MANAGE BLADDER WITH ASSISTANCE STG Manage Bladder With Minimal Assistance  Outcome: Not Progressing total assist with foley  Goal: RH STG MANAGE BLADDER WITH EQUIPMENT WITH ASSISTANCE STG Manage Bladder With Equipment With minimal Assistance  Outcome: Not Progressing Total assist with foley  Goal: RH OTHER STG BLADDER ELIMINATION GOALS W/ASSIST Other STG Bladder Elimination Goals With minimal Assistance  Outcome: Not Progressing Total assist

## 2013-08-16 NOTE — Progress Notes (Signed)
Patient received the 2nd unit of PRBC at 21;45 verified by Ellwood Dense, RN  and Antonietta Breach, RN. Unit # [id number]. Vital signs taken and recorded. No signs and symptoms. No adverse reaction noted. Will continue to monitor.

## 2013-08-16 NOTE — Progress Notes (Signed)
Physical Therapy Session Note  Patient Details  Name: Gabriel [name] MRN: [id number] Date of Birth: [birth year]  Today's Date: 08/16/2013 Time: 0830-0928 Time Calculation (min): 58 min  Short Term Goals: Week 1:  PT Short Term Goal 1 (Week 1): Pt will perform bed mobility at min assist with attention to L arm during rolling PT Short Term Goal 2 (Week 1): Pt will perform squat pivot transfers at mod assist.  PT Short Term Goal 3 (Week 1): Pt will perform w/c mobility using R hemi technique x 50' at min assist to avoid obstacles on L side PT Short Term Goal 4 (Week 1): Pt will perform dynamic standing balance at max assist.  PT Short Term Goal 5 (Week 1): Pt will ambulate x 20' w/ LRAD at max assist   Skilled Therapeutic Interventions/Progress Updates:   Pt received lying in bed asleep, but was easily aroused by voice this morning.  Performed supine to sit with HOB elevated and also with use of L handrail.  Provided manual assist for RUE to reach for L handrail in attempts to self elevate trunk, however pt only able to scoot hips somewhat towards EOB.  Provided assist for hips to scoot at EOB with tactile and manual facilitation at hip and trunk to elevate trunk into sitting.  Continues to require total assist to elevate and maintain trunk in upright alignment.  Pt able to reach for R handrail to prevent LOB L and posteriorly, however unable to maintain grip and he would then place R hand by his side and would push to L.  Assisted with doffing and donning new shirt due to current shirt being damp.  Provided total assist, however pt was able to initiate movement of R UE into arm hole with max cues.  Performed squat pivot transfer x 3 (bed >w/c and w/c <> mat table) at +2 assist and manual assist for forward trunk lean.  Pt unable to control trunk at all and relies on leaning on therapist during transfer.  Performed seated balance activities involving maintaining midline posture with BUE support, trunk  lean to R elbow and pushing back into sitting, trunk leans forwards/backwards in slow controlled manner to initiate abdominals and paraspinal musculature.  Also performed reaching activity to R side and placing them across body to L to address pusher tendencies and also to increase WB through L hip.  Pt continues to c/o intense back pack during session and perseverates on getting additional pain patch.  RN aware and had premedicated prior to session.  Returned pt to room in w/c with all needs in place and quick release belt donned.   Therapy Documentation Precautions:  Precautions Precautions: Fall Precaution Comments: pusher, left hemiparesis with decreased motor planning Restrictions Weight Bearing Restrictions: No General:   Vital Signs: Therapy Vitals Pulse Rate: 82 BP: 138/80 mmHg Pain: Pain Assessment Pain Assessment: 0-10 Pain Score: 10-Worst pain ever Pain Type: Chronic pain Pain Location: Shoulder Pain Orientation: Left Pain Radiating Towards: all over  Pain Descriptors / Indicators: Aching Pain Frequency: Occasional Pain Onset: On-going Patients Stated Pain Goal: 3 Pain Intervention(s): Medication (See eMAR);Repositioned (vicodin 1 po):    See FIM for current functional status  Therapy/Group: Individual Therapy  [name] 08/16/2013, 11:30 AM

## 2013-08-16 NOTE — Progress Notes (Addendum)
Subjective/Complaints: Pt confused, thinks he's at home Pt unaware of new def C/o upper back lower neck pain Review of Systems - Negative except left weakness  Objective: Vital Signs: Blood pressure 138/80, pulse 82, temperature 98.3 F (36.8 C), temperature source Oral, resp. rate 18, height 5\' 10"  (1.778 m), weight 67.994 kg (149 lb 14.4 oz), SpO2 98.00%. [name] Contrast  08/15/2013   CLINICAL DATA:  History of stroke and metastatic lung cancer. Worsening hemiparesis.  EXAM: MRI HEAD WITHOUT AND WITH CONTRAST  TECHNIQUE: Multiplanar, multiecho pulse sequences of the brain and surrounding structures were obtained according to standard protocol without and with intravenous contrast  CONTRAST:  14mL MULTIHANCE GADOBENATE DIMEGLUMINE 529 MG/ML IV SOLN  COMPARISON:  08/07/2013  FINDINGS: There has been extension of the infarction in the right hemisphere since the previous study. 4 cm region of infarction in the insular and right frontal region previously seen now measures 7 cm in size. Involvement of the basal ganglia remains evident. There is new involvement in the right anterior cerebral artery territory in the cingulate gyrus region the. Punctate foci of infarction previously seen in the cerebellum no longer show restricted diffusion with the exception of a single punctate focus which may be new. Previously seen punctate foci of infarction in the left parietal region no longer show restricted diffusion with the exception of a single punctate focus which may be new. There is mild swelling in the region of infarction on the right but no evidence of hemorrhage or shift. No hydrocephalus. No extra-axial collection. Flow appears to be present in the major vessels at the base of the brain. After contrast administration, there is some enhancement in the areas of infarction on the right. Small foci of enhancement are present in the areas of infarction of the left parieto-occipital region.  IMPRESSION:  Considerable extension of the infarction in the right hemisphere increased in size from 4 cm to 7 cm. Infarction is present not only in the right middle cerebral artery territory but also within the anterior cerebral artery territory affecting the cingulate gyrus. Punctate foci of restricted diffusion in the left cerebellum and in the left parietal region could represent new micro embolic infarctions or simply some residual restricted diffusion in areas of infarction previously demonstrated.   Electronically Signed   By: Paulina Fusi M.D.   On: 08/15/2013 09:59   Results for orders placed during the hospital encounter of 08/10/13 (from the past 72 hour(s))  URINALYSIS, ROUTINE W REFLEX MICROSCOPIC     Status: Abnormal   Collection Time    08/14/13  9:29 PM      Result Value Range   Color, Urine YELLOW  YELLOW   APPearance HAZY (*) CLEAR   Specific Gravity, Urine 1.016  1.005 - 1.030   pH 5.0  5.0 - 8.0   Glucose, UA NEGATIVE  NEGATIVE mg/dL   Hgb urine dipstick LARGE (*) NEGATIVE   Bilirubin Urine NEGATIVE  NEGATIVE   Ketones, ur NEGATIVE  NEGATIVE mg/dL   Protein, ur NEGATIVE  NEGATIVE mg/dL   Urobilinogen, UA 0.2  0.0 - 1.0 mg/dL   Nitrite NEGATIVE  NEGATIVE   Leukocytes, UA NEGATIVE  NEGATIVE  URINE MICROSCOPIC-ADD ON     Status: None   Collection Time    08/14/13  9:29 PM      Result Value Range   Squamous Epithelial / LPF RARE  RARE   WBC, UA 0-2  <3 WBC/hpf   RBC / HPF 7-10  <  3 RBC/hpf   Bacteria, UA RARE  RARE  CBC WITH DIFFERENTIAL     Status: Abnormal   Collection Time    08/15/13  7:29 AM      Result Value Range   WBC 8.3  4.0 - 10.5 K/uL   RBC 2.28 (*) 4.22 - 5.81 MIL/uL   Hemoglobin 7.3 (*) 13.0 - 17.0 g/dL   HCT 16.1 (*) 09.6 - 04.5 %   MCV 98.2  78.0 - 100.0 fL   MCH 32.0  26.0 - 34.0 pg   MCHC 32.6  30.0 - 36.0 g/dL   RDW 40.9 (*) 81.1 - 91.4 %   Platelets 13 (*) 150 - 400 K/uL   Comment: CRITICAL VALUE NOTED.  VALUE IS CONSISTENT WITH PREVIOUSLY REPORTED AND  CALLED VALUE.     CONSISTENT WITH PREVIOUS RESULT     REPEATED TO VERIFY   Neutrophils Relative % 76  43 - 77 %   Neutro Abs 6.3  1.7 - 7.7 K/uL   Lymphocytes Relative 17  12 - 46 %   Lymphs Abs 1.4  0.7 - 4.0 K/uL   Monocytes Relative 7  3 - 12 %   Monocytes Absolute 0.6  0.1 - 1.0 K/uL   Eosinophils Relative 0  0 - 5 %   Eosinophils Absolute 0.0  0.0 - 0.7 K/uL   Basophils Relative 0  0 - 1 %   Basophils Absolute 0.0  0.0 - 0.1 K/uL  BASIC METABOLIC PANEL     Status: Abnormal   Collection Time    08/15/13  7:29 AM      Result Value Range   Sodium 132 (*) 135 - 145 mEq/L   Potassium 3.7  3.5 - 5.1 mEq/L   Chloride 98  96 - 112 mEq/L   CO2 26  19 - 32 mEq/L   Glucose, Bld 102 (*) 70 - 99 mg/dL   BUN 10  6 - 23 mg/dL   Creatinine, Ser 7.82  0.50 - 1.35 mg/dL   Calcium 8.3 (*) 8.4 - 10.5 mg/dL   GFR calc non Af Amer >90  >90 mL/min   GFR calc Af Amer >90  >90 mL/min   Comment: (NOTE)     The eGFR has been calculated using the CKD EPI equation.     This calculation has not been validated in all clinical situations.     eGFR's persistently <90 mL/min signify possible Chronic Kidney     Disease.     HEENT: normal Cardio: RRR and no murmur Resp: CTA B/L and unlabored GI: BS positive and non distended Extremity:  Pulses positive and No Edema Skin:   Intact Neuro: Alert/Oriented, Cranial Nerve Abnormalities Left central 7, Normal Sensory, Abnormal Motor 0/5 Left delt , bi, tri, grip, 0 in Left HF, KE, ADF, Abnormal FMC Ataxic/ dec FMC and Dysarthric, severe L neglect Musc/Skel:  No pain to palp over neck adn upper back, no deformity Gen NAD   Assessment/Plan: 1. Functional deficits secondary to Left hemiparesis R thrombotic periopercular CVA which require 3+ hours per day of interdisciplinary therapy in a comprehensive inpatient rehab setting. Physiatrist is providing close team supervision and 24 hour management of active medical problems listed below. Physiatrist and rehab  team continue to assess barriers to discharge/monitor patient progress toward functional and medical goals. FIM: FIM - Bathing Bathing Steps Patient Completed: Chest;Abdomen Bathing: 1: Two helpers  FIM - Upper Body Dressing/Undressing Upper body dressing/undressing steps patient completed: Thread/unthread right sleeve of  pullover shirt/dresss;Pull shirt over trunk;Put head through opening of pull over shirt/dress;Thread/unthread left sleeve of pullover shirt/dress Upper body dressing/undressing: 1: Total-Patient completed less than 25% of tasks FIM - Lower Body Dressing/Undressing Lower body dressing/undressing steps patient completed: Thread/unthread right underwear leg Lower body dressing/undressing: 1: Two helpers  FIM - Toileting Toileting: 1: Two helpers  FIM - Diplomatic Services operational officer Devices: Therapist, music Transfers: 0-Activity did not occur  FIM - Banker Devices: Arm rests Bed/Chair Transfer: 1: Supine > Sit: Total A (helper does all/Pt. < 25%);1: Sit > Supine: Total A (helper does all/Pt. < 25%)  FIM - Locomotion: Wheelchair Distance: 50' Locomotion: Wheelchair: 2: Travels 50 - 149 ft with moderate assistance (Pt: 50 - 74%) FIM - Locomotion: Ambulation Locomotion: Ambulation Assistive Devices:  (handrail in hallway) Ambulation/Gait Assistance: 1: +2 Total assist Locomotion: Ambulation: 1: Two helpers (chair follow)  Comprehension Comprehension Mode: Auditory Comprehension: 5-Understands basic 90% of the time/requires cueing < 10% of the time  Expression Expression Mode: Verbal Expression: 4-Expresses basic 75 - 89% of the time/requires cueing 10 - 24% of the time. Needs helper to occlude trach/needs to repeat words.  Social Interaction Social Interaction: 4-Interacts appropriately 75 - 89% of the time - Needs redirection for appropriate language or to initiate interaction.  Problem Solving Problem  Solving: 3-Solves basic 50 - 74% of the time/requires cueing 25 - 49% of the time  Memory Memory: 2-Recognizes or recalls 25 - 49% of the time/requires cueing 51 - 75% of the time  Medical Problem List and Plan:  1. Thrombotic right periopercular region, subinsular region and lenticular nucleus and right caudate infarct with extension to involve most of R MCA distribution and some ACA and increased left hemiparesis, left neglect- Neuro to f/u, attempted to contact wife cell #, no answer 2. DVT Prophylaxis/Anticoagulation: SCDs. Monitor for any signs of DVT  3. Pain Management/chronic back pain related to metastatic lung cancer: Duragesic patch as prior to admission. Monitor with increased mobility, norco 10/325 for breakthrough pain as well  4. Neuropsych: This patient is capable of making decisions on his own behalf.  5. Dysphagia. Dysphagia 2 nectar thick liquids. Monitor for any signs of aspiration. Followup speech therapy  6. Non-ST elevated myocardial infarction. off aspirin therapy. Followup cardiology services as needed. Low-dose beta blocker has been added  7. Thrombocytopenia. Aspirin has been held  Followup CBC. Hematology services continues to follow, transuse plt if count <10K 8. Metastatic lung cancer. Followup oncology services Dr. Clelia Croft and radiation oncology Dr. Kathrynn Running. Patient received his first radiation treatment 02/14/2013 in addition to 3 cycle of chemotherapy last given 08/04/2013  9. Hyperlipidemia. Lipitor  10.  Urinary retention I/O cath, add Flomax 12.  Anemia, check stool guaic, transfuse 2 Units PRBCs has worsened thrombocytopenia but off all anticoagulants LOS (Days) 6 A FACE TO FACE EVALUATION WAS PERFORMED  Nathanie Ottley E 08/16/2013, 8:45 AM

## 2013-08-16 NOTE — Progress Notes (Signed)
Speech Language Pathology Daily Session Note  Patient Details  Name: Gabriel [name] MRN: [id number] Date of Birth: January 01, 1941  Today's Date: 08/16/2013 Time: 4098-1191 Time Calculation (min): 12 min  Short Term Goals: Week 1: SLP Short Term Goal 1 (Week 1): Patient will consume Dys.2 textures and thin liquids with Min verbal cues to recall and utilize safe swallow strategies to minimize s/s of aspiration. SLP Short Term Goal 2 (Week 1): Patient will perform pharyngeal strengthening exercises with Min verbal cues. SLP Short Term Goal 3 (Week 1): Patient will recall and utilize speech intelligibility strategies during a structured task with Min verbal cues. SLP Short Term Goal 4 (Week 1): Patient will demonstrate moderately-complex problem solving during functional task with Mod verbal cues. SLP Short Term Goal 5 (Week 1): Patient will label 2 physical and 2 cognitive deficits that resulted from CVA with Min question cues. SLP Short Term Goal 6 (Week 1): Patient will request help as needed with Mod question and verbal cues.  Skilled Therapeutic Interventions: Patient and wife seen for education regarding dysphagia education to reinforce self-feeding and use of safe swallow strategies.  Wife at patient's bedside and patient resting in bed.  Wife verbalized understating of current diet and liquid consistency and SLP reinforced that bread textures were not considered Dys.2 textures and that patient should be self-feeding when awake and alert.  SLP also emphasized that patient requires Max assist to identify appropriate items form the menu that are safe for his consumption.  Patient aroused to verbal stimuli, but unable to sustain attention to functional task in bed and refused to get out of bed due to pain.  RN notified and stated she was getting pain medication as well as getting ready to administer a blood transfusion.  As a result, session was ended early.      Pain Pain Assessment Pain  Assessment: No/denies pain  Therapy/Group: Individual Therapy  Charlane Ferretti., CCC-SLP [phone]  Aeon Kessner 08/16/2013, 4:42 PM

## 2013-08-16 NOTE — Plan of Care (Signed)
Problem: RH BLADDER ELIMINATION Goal: RH STG MANAGE BLADDER WITH ASSISTANCE STG Manage Bladder With Minimal Assistance  Outcome: Not Progressing Foley catheter in place due to urinary retention.

## 2013-08-16 NOTE — Progress Notes (Signed)
Occupational Therapy Session Note  Patient Details  Name: Gabriel [name] MRN: [id number] Date of Birth: [birth year]  Today's Date: 08/16/2013 Time: 1001-1100 Time Calculation (min): 59 min  Short Term Goals: Week 1:  OT Short Term Goal 1 (Week 1): Pt will perform LB bathing sit to stand with mod assist. OT Short Term Goal 2 (Week 1): Pt will donn a pullover shirt with supervision. OT Short Term Goal 3 (Week 1): Pt will maintain dynamic sitting balance with no more than min assist when attempting to donn LB clothing. OT Short Term Goal 4 (Week 1): Pt will perform toilet transfer to handicapped toilet or 3:1 with mod assist. OT Short Term Goal 5 (Week 1): Pt will use the LUE for bathing tasks with mod assist.  Skilled Therapeutic Interventions/Progress Updates:    Pt performed bathing and dressing today at the sink.  Pt with evolution of CVA since this therapist saw pt on Friday.  Now with no active movement noted in the LUE or LE when instructed to attempt.  Needed total assist to regain sitting balance throughout session while he was sitting supported in the wheelchair.  Pt with frequent LOB to the left and forward when attempting to reach for objects. Decreased ability to motor plan being able to sit back up after losing balance forward.  He needs mod assist for UB bathing and dressing and total assist +2 for LB selfcare sit to stand this session.  Returned to bed from wheelchair at conclusion of session secondary to back pain.  Pt needing total assist to transfer squat pivot to the left.  Once in bed performed PROM exercises 1 set of 10 reps for the LUE shoulder and elbow flexion as well as wrist flexion/extension and finger flexion/extension.    Therapy Documentation Precautions:  Precautions Precautions: Fall Precaution Comments: pusher, left hemiparesis with decreased motor planning Restrictions Weight Bearing Restrictions: No  Pain: Pain Assessment Pain Assessment: Faces Faces Pain  Scale: Hurts little more Pain Type: Chronic pain Pain Location: Back Pain Intervention(s): RN made aware;Repositioned ADL: See FIM for current functional status  Therapy/Group: Individual Therapy  Jalee Saine OTR/L 08/16/2013, 12:59 PM

## 2013-08-17 ENCOUNTER — Inpatient Hospital Stay (HOSPITAL_COMMUNITY): Payer: Medicare Other | Admitting: Speech Pathology

## 2013-08-17 ENCOUNTER — Inpatient Hospital Stay (HOSPITAL_COMMUNITY): Payer: Medicare Other

## 2013-08-17 ENCOUNTER — Inpatient Hospital Stay (HOSPITAL_COMMUNITY): Payer: Medicare Other | Admitting: Occupational Therapy

## 2013-08-17 ENCOUNTER — Inpatient Hospital Stay (HOSPITAL_COMMUNITY): Payer: Medicare Other | Admitting: *Deleted

## 2013-08-17 ENCOUNTER — Encounter (HOSPITAL_COMMUNITY): Payer: Medicare Other | Admitting: Rehabilitation

## 2013-08-17 LAB — CBC
HCT: 30.9 % — ABNORMAL LOW (ref 39.0–52.0)
Hemoglobin: 10.6 g/dL — ABNORMAL LOW (ref 13.0–17.0)
MCH: 32.1 pg (ref 26.0–34.0)
MCHC: 34.3 g/dL (ref 30.0–36.0)
Platelets: 17 10*3/uL — CL (ref 150–400)
RDW: 19.5 % — ABNORMAL HIGH (ref 11.5–15.5)
WBC: 10.1 10*3/uL (ref 4.0–10.5)

## 2013-08-17 LAB — TYPE AND SCREEN
ABO/RH(D): A POS
Antibody Screen: NEGATIVE
Unit division: 0

## 2013-08-17 MED ORDER — OXYCODONE HCL 5 MG PO TABS
10.0000 mg | ORAL_TABLET | ORAL | Status: DC | PRN
  Administered 2013-08-17 – 2013-08-24 (×5): 10 mg via ORAL
  Filled 2013-08-17 (×5): qty 2

## 2013-08-17 MED ORDER — DICLOFENAC SODIUM 1 % TD GEL
2.0000 g | Freq: Three times a day (TID) | TRANSDERMAL | Status: DC
  Administered 2013-08-18 – 2013-08-24 (×17): 2 g via TOPICAL
  Filled 2013-08-17: qty 100

## 2013-08-17 MED ORDER — SODIUM CHLORIDE 0.9 % IJ SOLN
10.0000 mL | INTRAMUSCULAR | Status: DC | PRN
  Administered 2013-08-17 – 2013-08-22 (×6): 10 mL

## 2013-08-17 NOTE — Progress Notes (Signed)
Physical Therapy Session Note  Patient Details  Name: Gabriel [name] MRN: [id number] Date of Birth: 02-07-1941  Today's Date: 08/17/2013 Time: 4098-1191 Time Calculation (min): 30 min  Short Term Goals: Week 1:  PT Short Term Goal 1 (Week 1): Pt will perform bed mobility at min assist with attention to L arm during rolling PT Short Term Goal 2 (Week 1): Pt will perform squat pivot transfers at mod assist.  PT Short Term Goal 3 (Week 1): Pt will perform w/c mobility using R hemi technique x 50' at min assist to avoid obstacles on L side PT Short Term Goal 4 (Week 1): Pt will perform dynamic standing balance at max assist.  PT Short Term Goal 5 (Week 1): Pt will ambulate x 20' w/ LRAD at max assist   Skilled Therapeutic Interventions/Progress Updates:    Patient received supine in bed, wife present. Session focused on bed mobility, functional transfers, standing tolerance, and seated postural control. Patient supine>sit TotalA with manual facilitation of R UE to reach L bed rail to allow patient to pull himself into L sidelying. Verbal cues for hand placement, advancement of R/L LE into L sidelying. L sidelying>sit TotalA with manual facilitation to advance B LE off bed. While seated EOB, therapist provided posterior trunk support to maintain upright posture, and manual facilitation of placement of R UE on bed farther way from patient to decrease pusher tendencies. Patient +2totalA for lateral scoot on bed toward wheelchair. Bed>wheelchair squat pivot transfer (R side) +2TotalA, verbal cues for hand placement and anterior weight shift. Patient totalA for wheelchair mobility room>gym. Squat pivot transfer wheelchair>mat table (R side) +2total A, verbal cues for hand placement and anterior weight shift. Patient instructed in static sitting balance with therapist seated on both sides for manual facilitation, one therapist maintained R UE in elbow flexion to decrease pusher tendencies. Verbal/tactile cues  for cervical extension, upright posture, and to relax R UE when sitting. Patient instructed in sit<>stand x2 trials +2totalA (3 musketeer), 45seconds and 30 seconds, with verbal/tactile cues for upright posture, cervical extension, and gluteal contraction. Patient mat table>wheelchair squat pivot transfer(L side) +2totalA, verbal cues for hand placement and anterior weight shift. Ended session with patient seated in wheelchair with PT.   Therapy Documentation Precautions:  Precautions Precautions: Fall Precaution Comments: pusher, left hemiparesis with decreased motor planning Restrictions Weight Bearing Restrictions: No Pain: Pain Assessment Pain Assessment: No/denies pain Pain Score: 0-No pain Mobility:   Locomotion :    Trunk/Postural Assessment :    Balance:   Exercises:   Other Treatments:    See FIM for current functional status  Therapy/Group: Co-Treatment with PT.  Gabriel [name] 08/17/2013, 4:38 PM

## 2013-08-17 NOTE — Progress Notes (Signed)
Social Work Patient ID: Gabriel [name], male   DOB: [birth year], 72 y.o.   MRN: [id number] Met with pt and wife to discuss team conference goals and discuss Palliative care consult.  Wife aware they will be contacting her and  Will set up a meeting.  Both are aware pt is too much care for her since his other stroke.  She has a home right next to [facility] and would like  To pursue this when he is ready to be moved.  Will work on discharge and provide support to both.

## 2013-08-17 NOTE — Progress Notes (Addendum)
1200; Pt. c/o "bad" back pain; states Norco not holding 6 hrs. Harvel Ricks PAC made aware. K-pad obtained.  At 1850 pt continues to c/o back pain.  Marissa Nestle, PAC aware; Voltaren Gel ordered, Oxy IR 10mg  given;  1920 pt asleep, monitor.  Poor po intake, minimal  Fluids; snacks and nectar flds offered frequently, on water protocol, few sips.  LBM 10/18; to receive sorbitol at HS.

## 2013-08-17 NOTE — Plan of Care (Signed)
Problem: RH Stairs Goal: LTG Patient will ambulate up and down stairs w/assist (PT) LTG: Patient will ambulate up and down # of stairs with assistance (PT)  May need to D/C this goal if plan changes to SNF.

## 2013-08-17 NOTE — Progress Notes (Addendum)
Physical Therapy Session Note  Patient Details  Name: Gabriel [name] MRN: [id number] Date of Birth: 1941/07/26  Today's Date: 08/17/2013 Time: 1001-1030 and 1400-1430 Time Calculation (min): 29 min and 30 mins  Short Term Goals: Week 1:  PT Short Term Goal 1 (Week 1): Pt will perform bed mobility at min assist with attention to L arm during rolling PT Short Term Goal 2 (Week 1): Pt will perform squat pivot transfers at mod assist.  PT Short Term Goal 3 (Week 1): Pt will perform w/c mobility using R hemi technique x 50' at min assist to avoid obstacles on L side PT Short Term Goal 4 (Week 1): Pt will perform dynamic standing balance at max assist.  PT Short Term Goal 5 (Week 1): Pt will ambulate x 20' w/ LRAD at max assist   Skilled Therapeutic Interventions/Progress Updates:   AM session:  Pt received in bed this morning, and was agreeable to sitting at EOB for bathing and dressing session co-treatment with OT.  Performed supine to L SL at total assist and requires +2 assist for SL to sitting position.  Requires max verbal and manual assist to turn head to the L in order to locate bed rail during bed mobility.  Also requires hand over hand for RUE to initiate reaching for bed rail.  He was able to initiate RLE out of bed, however requires total assist for LLE out of bed and to elevate trunk into sitting.  Once in sitting requires total assist to maintain balance.  Did note some intermittent trunk activation and could provide max assist.  Provided manual facilitation at paraspinals and chest for upright posture with visual target for pt in order to achieve more upright posture.  He was unable to maintain this posture on his own during session.  Performed several reaching activities to activate trunk musculature and to attend to L side during bathing activity.  Again, pt requires hand over hand cuing in order to facilitate/initiate movement to complete task.  Provided cues for pt to bring his R  shoulder to therapists L shoulder in order to decrease pusher tendencies.  Performed standing x 2 reps with +2 assist in order to remove clothing and don new clothing following bathing.  Requires assist at L knee to prevent buckling with therapist on R side as well under pts R axilla for increased trunk support.  Max verbal and visual cues for upright posture throughout.  Note he is able to verbalize what task he is to perform, however unable to motor plan and initiate task in timely manner.  See OT note for further details about balance and mobility during session.    PM session:  Skilled co-treat with PT during this session.  See PT note for first half of session.  Once pt back in chair with +2 assist squat pivot to pts L side, performing seated reaching activities to pts R and L in attempts to activate abdominal musculature for forward flexion and then to control return to chair with activation of back extensors.  Pt continually states "I can't", and note that he continues to only extend arm during activity instead of activation abdominals.  Then provided hand over hand assist to initiate activation in that manner with improved technique noted.  Continued to have decreased ability to control trunk extension, however did note slight improvement.  Also placed mirror in front of pt for visual feedback to allow pt to attain upright posture.  Note cervical extension improved immediately with  use of mirror, however he was unable to maintain head in upright alignment.  Continue to have pt reach to the R in chair by sliding R hand along mat table, however pusher syndrome very severe and even seated in chair, he pushes to the L.  Provided manual facilitation of R lateral lean and then provided cues for pt to regain upright posture following lean.  He was able to return to sitting position, however still with lateral lean to L.  Pt left in w/c with half tray lap table and pillow in place for LUE, quick release belt  donned, and waiting for next OT session.    Therapy Documentation Precautions:  Precautions Precautions: Fall Precaution Comments: pusher, left hemiparesis with decreased motor planning Restrictions Weight Bearing Restrictions: No   Pain: Pain Assessment Pain Assessment: Faces Pain Score: 6  Faces Pain Scale: Hurts little more Pain Type: Chronic pain Pain Location: Back Pain Descriptors / Indicators: Aching Pain Onset: On-going Patients Stated Pain Goal: 3 Pain Intervention(s): Medication (See eMAR);Repositioned Multiple Pain Sites: No   See FIM for current functional status  Therapy/Group: Co-Treatment  Trask Vosler, Meribeth Mattes 08/17/2013, 11:57 AM

## 2013-08-17 NOTE — Progress Notes (Signed)
Occupational Therapy Session Note  Patient Details  Name: Gabriel [name] MRN: [id number] Date of Birth: [birth year]  Today's Date: 08/17/2013 Time: 1030-1104 Time Calculation (min): 34 min  Short Term Goals: Week 1:  OT Short Term Goal 1 (Week 1): Pt will perform LB bathing sit to stand with mod assist. OT Short Term Goal 2 (Week 1): Pt will donn a pullover shirt with supervision. OT Short Term Goal 3 (Week 1): Pt will maintain dynamic sitting balance with no more than min assist when attempting to donn LB clothing. OT Short Term Goal 4 (Week 1): Pt will perform toilet transfer to handicapped toilet or 3:1 with mod assist. OT Short Term Goal 5 (Week 1): Pt will use the LUE for bathing tasks with mod assist.  Skilled Therapeutic Interventions/Progress Updates:    Pt performed supine to sit EOB with total assist.  Sits statically at EOB with total assist to attempt bathing and dressing.  He continues to need max instructional cueing to sequence through the bathing task.  He demonstrates decreased ability to physically initiate tasks, even though he can verbally repeat instructions when he's told.  Therapist had to help lift his hand from his knee and then he would attempt to reach for the washcloth to wash himself.  Maintains posterior pelvic tilt and cervical flexion in sitting with rounded shoulders.  Needs total assist to achieve anterior pelvic tilt and maintain.  Positioned water for bathing to help increase upright posture so pt would have to scan slightly to the left and reach up with his right hand.  He needs total assist +2 for sit to stand and cannot achieve full upright standing when therapists were assisting with LB selfcare tasks.  Performed dressing with overall max assist to place his LEs in his pants and brief.  Therapist assisted with crossing the LLE over the right knee to get them started and pt attempted to hold the clothing and reaching toward his feet but needs total assist to keep  from falling over.  Transferred to wheelchair with conclusion of dressing with total assist +2.  Discussed pt's progress with wife and helped position pt better in the wheelchair with 1/2 lap tray to help support the LUE and safety belt in place.  Encouraged pt to watch TV in order to help increase cervical extension and positioning.  Therapy Documentation Precautions:  Precautions Precautions: Fall Precaution Comments: pusher, left hemiparesis with decreased motor planning Restrictions Weight Bearing Restrictions: No Pain: Pain Assessment Pain Assessment: Faces Pain Score: 6  Faces Pain Scale: Hurts little more Pain Type: Chronic pain Pain Location: Back Pain Descriptors / Indicators: Aching Pain Onset: On-going Patients Stated Pain Goal: 3 Pain Intervention(s): Medication (See eMAR);Repositioned Multiple Pain Sites: No ADL: See FIM for current functional status  Therapy/Group: Individual Therapy  Gabriel [name] OTR/L 08/17/2013, 11:48 AM

## 2013-08-17 NOTE — Progress Notes (Signed)
Speech Language Pathology Daily Session Note  Patient Details  Name: Gabriel [name] MRN: [id number] Date of Birth: 1941-04-30  Today's Date: 08/17/2013 Time: 1150-1210 Time Calculation (min): 20 min  Short Term Goals: Week 1: SLP Short Term Goal 1 (Week 1): Patient will consume Dys.2 textures and thin liquids with Min verbal cues to recall and utilize safe swallow strategies to minimize s/s of aspiration. SLP Short Term Goal 2 (Week 1): Patient will perform pharyngeal strengthening exercises with Min verbal cues. SLP Short Term Goal 3 (Week 1): Patient will recall and utilize speech intelligibility strategies during a structured task with Min verbal cues. SLP Short Term Goal 4 (Week 1): Patient will demonstrate moderately-complex problem solving during functional task with Mod verbal cues. SLP Short Term Goal 5 (Week 1): Patient will label 2 physical and 2 cognitive deficits that resulted from CVA with Min question cues. SLP Short Term Goal 6 (Week 1): Patient will request help as needed with Mod question and verbal cues.  Skilled Therapeutic Interventions: Pt participated in co-treatment with OT in diners club with focus on dysphagia and cognitive goals. SLP facilitated session with skilled observation of diet tolerance with no overt s/s of aspiration dys. 2 textures and nectar thick liquids. Pt required Min verbal cues for clearance of pocketed food. He also required Min cues to attend to left visual field. Continue plan of care.    FIM:  Comprehension Comprehension Mode: Auditory Comprehension: 5-Follows basic conversation/direction: With no assist Expression Expression Mode: Verbal Expression: 6-Expresses complex ideas: With extra time/assistive device Social Interaction Social Interaction: 5-Interacts appropriately 90% of the time - Needs monitoring or encouragement for participation or interaction. Problem Solving Problem Solving: 3-Solves basic 50 - 74% of the time/requires cueing  25 - 49% of the time Memory Memory: 2-Recognizes or recalls 25 - 49% of the time/requires cueing 51 - 75% of the time FIM - Eating Eating Activity: 5: Set-up assist for open containers;1: Helper feeds patient;5: Needs verbal cues/supervision  Pain Pain Assessment Pain Assessment: No/denies pain Pain Score: 6  Faces Pain Scale: Hurts little more Pain Type: Chronic pain Pain Location: Back Pain Descriptors / Indicators: Aching Pain Onset: On-going Patients Stated Pain Goal: 3 Pain Intervention(s): Medication (See eMAR);Repositioned Multiple Pain Sites: No  Therapy/Group: Individual Therapy  Maxcine Ham 08/17/2013, 12:57 PM  Maxcine Ham, M.A. CCC-SLP [phone]

## 2013-08-17 NOTE — Progress Notes (Signed)
Speech Language Pathology Daily Session Note  Patient Details  Name: Gabriel [name] MRN: [id number] Date of Birth: [birth year]  Today's Date: 08/17/2013 Time: 0830-0900 Time Calculation (min): 30 min  Short Term Goals: Week 1: SLP Short Term Goal 1 (Week 1): Patient will consume Dys.2 textures and thin liquids with Min verbal cues to recall and utilize safe swallow strategies to minimize s/s of aspiration. SLP Short Term Goal 2 (Week 1): Patient will perform pharyngeal strengthening exercises with Min verbal cues. SLP Short Term Goal 3 (Week 1): Patient will recall and utilize speech intelligibility strategies during a structured task with Min verbal cues. SLP Short Term Goal 4 (Week 1): Patient will demonstrate moderately-complex problem solving during functional task with Mod verbal cues. SLP Short Term Goal 5 (Week 1): Patient will label 2 physical and 2 cognitive deficits that resulted from CVA with Min question cues. SLP Short Term Goal 6 (Week 1): Patient will request help as needed with Mod question and verbal cues.  Skilled Therapeutic Interventions: Skilled treatment session focused on addressing dysphagia and dysarthria goals.  SLP facilitated session with breakfast of Dys.1 textures and nectar-thick liquids due to patient refusal to consume Dys.2 textures.  Patietn required MAx verbal cues to perform 2 swallows, which prevented any overt s/s of aspiration during session.  SLP also introduced and provided education regarding speech intelligibility strategies.  Continue with current plan of care.     FIM:  Comprehension Comprehension Mode: Auditory Comprehension: 5-Follows basic conversation/direction: With no assist Expression Expression Mode: Verbal Expression: 6-Expresses complex ideas: With extra time/assistive device Social Interaction Social Interaction: 5-Interacts appropriately 90% of the time - Needs monitoring or encouragement for participation or interaction. Problem  Solving Problem Solving: 3-Solves basic 50 - 74% of the time/requires cueing 25 - 49% of the time Memory Memory: 2-Recognizes or recalls 25 - 49% of the time/requires cueing 51 - 75% of the time FIM - Eating Eating Activity: 5: Set-up assist for open containers;1: Helper feeds patient;5: Needs verbal cues/supervision  Pain Pain Assessment Pain Assessment: 0-10 Pain Score: 6  Pain Type: Chronic pain Pain Location: Back Pain Descriptors / Indicators: Aching Pain Onset: On-going Patients Stated Pain Goal: 3 Pain Intervention(s): RN made aware;Repositioned Multiple Pain Sites: No  Therapy/Group: Individual Therapy  Charlane Ferretti., CCC-SLP [phone]  Johnney Scarlata 08/17/2013, 9:13 AM

## 2013-08-17 NOTE — Progress Notes (Signed)
Occupational Therapy Session Note  Patient Details  Name: Moyses Pavey MRN: [id number] Date of Birth: 1940/12/16  Today's Date: 08/17/2013 Time: 1130-1150 Time Calculation (min): 20 min  Skilled Therapeutic Interventions: [employer] with SLP.  Focus on use of  Left UE as gross stabilizer, attention to environment and body with mod A, sustained attention with min A in a minimally distracting environment, encouragement of PO intake with verbal cues to prevent food pocketing in left cheek and to swallow each bite before taking additional food.  RN notified of patient's request for use of heating k-pad on back to relieve pain.  Pain: Pain Assessment Pain Assessment: No/denies pain Pain Score: 6  Faces Pain Scale: Hurts little more Pain Type: Chronic pain Pain Location: Back Pain Descriptors / Indicators: Aching Pain Onset: On-going Patients Stated Pain Goal: 3 Pain Intervention(s): Medication (See eMAR);Repositioned Multiple Pain Sites: No  Therapy/Group: Group Therapy  Veronia Laprise 08/17/2013, 1:01 PM

## 2013-08-17 NOTE — Patient Care Conference (Signed)
Inpatient RehabilitationTeam Conference and Plan of Care Update Date: 08/17/2013   Time: 11:50 AM    Patient Name: Gabriel [name]      Medical Record Number: [id number]  Date of Birth: 04/12/1941 Sex: Male         Room/Bed: 4W22C/4W22C-01 Payor Info: Payor: Advertising copywriter MEDICARE / Plan: [employer] MEDICARE COMPLETE / Product Type: *No Product type* /    Admitting Diagnosis: CVA MI LUNG CA  Admit Date/Time:  08/10/2013  3:59 PM Admission Comments: No comment available   Primary Diagnosis:  CVA (cerebral infarction) Principal Problem: CVA (cerebral infarction)  Patient Active Problem List   Diagnosis Date Noted  . CVA (cerebral infarction) 08/11/2013  . Thrombocytopenia 08/10/2013  . Essential hypertension, benign 08/10/2013  . Encounter for long-term (current) use of medications 08/10/2013  . Arterial ischemic stroke, MCA (middle cerebral artery), right, acute 08/10/2013  . Stroke 08/07/2013  . NSTEMI (non-ST elevated myocardial infarction) 08/06/2013  . Hypertension 02/09/2013  . Pain of left leg 02/09/2013  . Lung mass 02/09/2013  . Soft tissue mass 02/09/2013  . Malignant tumor of vertebral column 02/09/2013    Expected Discharge Date:    Team Members Present: Physician leading conference: Dr. Claudette Laws Social Worker Present: Dossie Der, LCSW;Jenny Prevatt, LCSW Nurse Present: Other (comment) Kandra Nicolas) PT Present: Edman Circle, PT;Emily Marya Amsler, PT OT Present: Rosalio Loud, OT;Kris Elise Benne, OT SLP Present: Fae Pippin, SLP     Current Status/Progress Goal Weekly Team Focus  Medical   Extension of R MCA infarct to include ACA, progressive metastatic lung CA  establish goals sof care  palliative consult   Bowel/Bladder   Foley catheter in place, LBM 08/13/13  To be continent of bowel and bladder,.  Monitor any signs and symptoms of diarrhea or constipation.   Swallow/Nutrition/ Hydration   Dys.2 textures and nectar-thick liquids  with full supervision   least restrictive p.o. intake   trials of Dys.3 textures    ADL's   Pt is currently mod assist for UB selfcare and total +2 for LB selfcare.  He needs total assist as well for transfers at this time.  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  Has regressed to [place] stage I in the left arm and hand.  downgraded to mod assist level for functional transfers and LB selfcare and min assist for UB selfcare  sitting balance, standing balance, neuromuscular re-education, selfcare retraining   Mobility   Pt is currently total assist for bed mobility, total assist to maintain upright sitting balance, +2 for transfers, have not attempted standing at this time due to safety issues since extension noted on MRI.  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    downgraded goals to mod assist, may D/C stair goal, depending on D/C plan   sitting balance, transfers, standing balance, NMR to LUE/LE, D/C planning, pt/family education   Communication   Min assist  Supervision   increase use of compensatory strategies   Safety/Cognition/ Behavioral Observations  Mod-Max assist   Supervision   increase awareness, self-monitoring and correcting   Pain   c/o pain to back and left shoulder. Hydrocodone 10/325 mg given.  <3  Monitor any onset of pain and assess effectiveness of PRN meds.   Skin   skin tear to left shoulder with Tegaderm  No new skin breakdown/ infection  Assess skin q shift      *See Care Plan and progress notes for long and short-term goals.  Barriers to Discharge: heavy physical assist    Possible Resolutions to Barriers:  ?SNF    Discharge Planning/Teaching Needs:  Wife wants to take him home now with extension of CVA unsure if she can manage him at home.  Aware he will require 24 hr care      Team Discussion:  Palliative care consulted to emet with pt and wife.  Pt doing better today-transfusion yesterday.  Increasing pain meds for  back pain.  Discussing SNF with wife and pt  Revisions to Treatment Plan:  Downgraded goals to mod-assist level- due to extension of CVA 10/20   Continued Need for Acute Rehabilitation Level of Care: The patient requires daily medical management by a physician with specialized training in physical medicine and rehabilitation for the following conditions: Daily direction of a multidisciplinary physical rehabilitation program to ensure safe treatment while eliciting the highest outcome that is of practical value to the patient.: Yes Daily medical management of patient stability for increased activity during participation in an intensive rehabilitation regime.: Yes Daily analysis of laboratory values and/or radiology reports with any subsequent need for medication adjustment of medical intervention for : Neurological problems;Other  Lucy Chris 08/17/2013, 12:47 PM

## 2013-08-17 NOTE — Progress Notes (Signed)
Occupational Therapy Weekly Progress Note  Patient Details  Name: Gabriel [name] MRN: [id number] Date of Birth: 30-Mar-1941  Today's Date: 08/17/2013  Patient has met 0 of 5 short term goals.  Mr. Krager unfortunately suffered a second stroke during his admission on our rehab unit.  Because of the severity of the CVA he has actually regressed in all of his selfcare tasks, functional transfers.  Currently he needs total assist + 2 for all squat pivot transfers as well as standing when attempting LB selfcare tasks.  His left inattention has worsened as well as his ability to initiate any functional tasks with even the right hand.  Mr. Doyle needs total assist for all bed mobility and for static sitting balance EOB.  He demonstrates no functional movement in either the LLE and RLE at this time and exhibits pusher strategies to the left side in both sitting and standing.  His discharge plan has changed secondary to this unfortunate event and family is pursuing SNF placement.  Recommend continued OT treatment to help reduce burden of care and increase pt's overall disposition and performance of basic selfcare tasks.    Patient continues to demonstrate the following deficits: decreased balance, decreased attention, decreased spatial awareness, decreased RUE and RLE functional use, and therefore will continue to benefit from skilled OT intervention to enhance overall performance with BADL.  Patient not progressing toward long term goals.  See goal revision..  Plan of care revisions: pt plans to discharge to SNF.  OT Short Term Goals Week 2:  OT Short Term Goal 1 (Week 2): Pt will maintain sitting balance statically EOB with max assist in preparation for selfcare tasks.   OT Short Term Goal 2 (Week 2): Pt will initiate and complete UB bathing with no more than min assist and min instructional cueing.   OT Short Term Goal 3 (Week 2): Pt will perform LB bathing with max assist sit to stand.  OT Short Term  Goal 4 (Week 2): Pt will position the LUE in his lap with no more than mod instructional cueing before or after a transitional movement.    Therapy Documentation Precautions:  Precautions Precautions: Fall Precaution Comments: pusher, left hemiparesis with decreased motor planning Restrictions Weight Bearing Restrictions: No  Pain: Pain Assessment Pain Assessment: Faces Faces Pain Scale: Hurts little more Pain Type: Chronic pain Pain Location: Back Pain Intervention(s): Repositioned;Heat applied ADL: See FIM for current functional status  Therapy/Group: Individual Therapy  Analyah Mcconnon OTR/L 08/17/2013, 4:05 PM

## 2013-08-17 NOTE — Progress Notes (Signed)
I have reviewed and agree with the treatment as reflected in this note. Gotham Raden, PT DPT  

## 2013-08-17 NOTE — Progress Notes (Signed)
Occupational Therapy Session Note  Patient Details  Name: Gabriel [name] MRN: [id number] Date of Birth: December 05, 1940  Today's Date: 08/17/2013 Time: 7829-5621 Time Calculation (min): 31 min  Skilled Therapeutic Interventions/Progress Updates:    Pt worked on transfer from wheelchair back to the edge of the bed with total assist to the left side.  In sitting pt needs total assist to maintain static sitting balance and maintains cervical flexion and posterior pelvic tilt.  He is currently unable to initiate trunk movement without total assist to correct LOB, but is aware and can state which way he is falling.  Mr. Loren still demonstrates increased pusher tendencies to the left side and attempts to keep his RUE on the bed and extended during session for stabilization, unaware that he is pushing himself over to the left.  Returned to supine after sitting EOB for approximately 15 mins.  Total assist needed to transition from sitting to supine.  Placed heating pad on his back to help decrease pain and repositioned his LUE and LLE.  Wife present for session as well.    Therapy Documentation Precautions:  Precautions Precautions: Fall Precaution Comments: pusher, left hemiparesis with decreased motor planning Restrictions Weight Bearing Restrictions: No  Pain: Pain Assessment Pain Assessment: Faces Faces Pain Scale: Hurts little more Pain Type: Chronic pain Pain Location: Back Pain Intervention(s): Repositioned;Heat applied ADL: See FIM for current functional status  Therapy/Group: Individual Therapy  Jameika Kinn OTR/L 08/17/2013, 3:21 PM

## 2013-08-17 NOTE — Progress Notes (Signed)
Subjective/Complaints: Oriented to [facility] T spine xray, no metastatic lesions or new fractures On Fentanyl C/o upper back lower neck pain Review of Systems - Negative except left weakness  Objective: Vital Signs: Blood pressure 154/80, pulse 84, temperature 98 F (36.7 C), temperature source Oral, resp. rate 18, height 5\' 10"  (1.778 m), weight 67.994 kg (149 lb 14.4 oz), SpO2 98.00%. Dg Thoracic Spine 2 View  08/16/2013   CLINICAL DATA:  Complains of upper back pain. Limited movement in upper extremities.  EXAM: THORACIC SPINE - 2 VIEW  COMPARISON:  Chest CT 02/09/2013 and chest radiograph 08/06/2013  FINDINGS: AP, lateral and swimmer's view of the thoracic spine were obtained. There is a right subclavian Port-A-Cath and the tip is in the SVC region. Again noted is a vertebral body height loss at T5. Findings are consistent with a compression fracture at this level. Mild degenerative changes in the lower cervical spine. Bone detail at T1 is limited on the lateral views. No clear evidence for a new vertebral body compression fracture. Prior CT demonstrated a lucent lesion involving the T12 vertebral body which is not well characterized on this examination. The configuration of the T12 vertebral body has not significantly changed.  IMPRESSION: There is an old compression fracture at T5.  No evidence for a new compression deformity. Please note that evaluation of the T1 vertebral body on the lateral view is very limited. If there is concern for disease in this area, consider further evaluation with CT or MRI.   Electronically Signed   By: Richarda Overlie M.D.   On: 08/16/2013 13:59   Results for orders placed during the hospital encounter of 08/10/13 (from the past 72 hour(s))  URINALYSIS, ROUTINE W REFLEX MICROSCOPIC     Status: Abnormal   Collection Time    08/14/13  9:29 PM      Result Value Range   Color, Urine YELLOW  YELLOW   APPearance HAZY (*) CLEAR   Specific Gravity, Urine 1.016  1.005 -  1.030   pH 5.0  5.0 - 8.0   Glucose, UA NEGATIVE  NEGATIVE mg/dL   Hgb urine dipstick LARGE (*) NEGATIVE   Bilirubin Urine NEGATIVE  NEGATIVE   Ketones, ur NEGATIVE  NEGATIVE mg/dL   Protein, ur NEGATIVE  NEGATIVE mg/dL   Urobilinogen, UA 0.2  0.0 - 1.0 mg/dL   Nitrite NEGATIVE  NEGATIVE   Leukocytes, UA NEGATIVE  NEGATIVE  URINE MICROSCOPIC-ADD ON     Status: None   Collection Time    08/14/13  9:29 PM      Result Value Range   Squamous Epithelial / LPF RARE  RARE   WBC, UA 0-2  <3 WBC/hpf   RBC / HPF 7-10  <3 RBC/hpf   Bacteria, UA RARE  RARE  CBC WITH DIFFERENTIAL     Status: Abnormal   Collection Time    08/15/13  7:29 AM      Result Value Range   WBC 8.3  4.0 - 10.5 K/uL   RBC 2.28 (*) 4.22 - 5.81 MIL/uL   Hemoglobin 7.3 (*) 13.0 - 17.0 g/dL   HCT 16.1 (*) 09.6 - 04.5 %   MCV 98.2  78.0 - 100.0 fL   MCH 32.0  26.0 - 34.0 pg   MCHC 32.6  30.0 - 36.0 g/dL   RDW 40.9 (*) 81.1 - 91.4 %   Platelets 13 (*) 150 - 400 K/uL   Comment: CRITICAL VALUE NOTED.  VALUE IS CONSISTENT WITH PREVIOUSLY  REPORTED AND CALLED VALUE.     CONSISTENT WITH PREVIOUS RESULT     REPEATED TO VERIFY   Neutrophils Relative % 76  43 - 77 %   Neutro Abs 6.3  1.7 - 7.7 K/uL   Lymphocytes Relative 17  12 - 46 %   Lymphs Abs 1.4  0.7 - 4.0 K/uL   Monocytes Relative 7  3 - 12 %   Monocytes Absolute 0.6  0.1 - 1.0 K/uL   Eosinophils Relative 0  0 - 5 %   Eosinophils Absolute 0.0  0.0 - 0.7 K/uL   Basophils Relative 0  0 - 1 %   Basophils Absolute 0.0  0.0 - 0.1 K/uL  BASIC METABOLIC PANEL     Status: Abnormal   Collection Time    08/15/13  7:29 AM      Result Value Range   Sodium 132 (*) 135 - 145 mEq/L   Potassium 3.7  3.5 - 5.1 mEq/L   Chloride 98  96 - 112 mEq/L   CO2 26  19 - 32 mEq/L   Glucose, Bld 102 (*) 70 - 99 mg/dL   BUN 10  6 - 23 mg/dL   Creatinine, Ser 1.61  0.50 - 1.35 mg/dL   Calcium 8.3 (*) 8.4 - 10.5 mg/dL   GFR calc non Af Amer >90  >90 mL/min   GFR calc Af Amer >90  >90  mL/min   Comment: (NOTE)     The eGFR has been calculated using the CKD EPI equation.     This calculation has not been validated in all clinical situations.     eGFR's persistently <90 mL/min signify possible Chronic Kidney     Disease.  TYPE AND SCREEN     Status: None   Collection Time    08/16/13 12:15 PM      Result Value Range   ABO/RH(D) A POS     Antibody Screen NEG     Sample Expiration 08/19/2013     Unit Number [id number]     Blood Component Type RED CELLS,LR     Unit division 00     Status of Unit ISSUED,FINAL     Transfusion Status OK TO TRANSFUSE     Crossmatch Result Compatible     Unit Number [id number]     Blood Component Type RED CELLS,LR     Unit division 00     Status of Unit ISSUED,FINAL     Transfusion Status OK TO TRANSFUSE     Crossmatch Result Compatible    PREPARE RBC (CROSSMATCH)     Status: None   Collection Time    08/16/13 12:15 PM      Result Value Range   Order Confirmation ORDER PROCESSED BY BLOOD BANK    ABO/RH     Status: None   Collection Time    08/16/13 12:15 PM      Result Value Range   ABO/RH(D) A POS    CBC     Status: Abnormal   Collection Time    08/17/13  3:40 AM      Result Value Range   WBC 10.1  4.0 - 10.5 K/uL   RBC 3.30 (*) 4.22 - 5.81 MIL/uL   Hemoglobin 10.6 (*) 13.0 - 17.0 g/dL   Comment: POST TRANSFUSION SPECIMEN   HCT 30.9 (*) 39.0 - 52.0 %   MCV 93.6  78.0 - 100.0 fL   MCH 32.1  26.0 - 34.0 pg   MCHC 34.3  30.0 - 36.0 g/dL   RDW 16.1 (*) 09.6 - 04.5 %   Platelets 17 (*) 150 - 400 K/uL   Comment: DELTA CHECK NOTED     SPECIMEN CHECKED FOR CLOTS     REPEATED TO VERIFY     CRITICAL VALUE NOTED.  VALUE IS CONSISTENT WITH PREVIOUSLY REPORTED AND CALLED VALUE.     CONSISTENT WITH PREVIOUS RESULT     HEENT: normal Cardio: RRR and no murmur Resp: CTA B/L and unlabored GI: BS positive and non distended Extremity:  Pulses positive and No Edema Skin:   Intact Neuro: Alert/Oriented, Cranial Nerve  Abnormalities Left central 7, Normal Sensory, Abnormal Motor 0/5 Left delt , bi, tri, grip, 0 in Left HF, KE, ADF, Abnormal FMC Ataxic/ dec FMC and Dysarthric, severe L neglect Musc/Skel:  No pain to palp over neck adn upper back, no deformity Gen NAD   Assessment/Plan: 1. Functional deficits secondary to Left hemiparesis R thrombotic periopercular CVA which require 3+ hours per day of interdisciplinary therapy in a comprehensive inpatient rehab setting. Physiatrist is providing close team supervision and 24 hour management of active medical problems listed below. Physiatrist and rehab team continue to assess barriers to discharge/monitor patient progress toward functional and medical goals. Team conference today please see physician documentation under team conference tab, met with team face-to-face to discuss problems,progress, and goals. Formulized individual treatment plan based on medical history, underlying problem and comorbidities. FIM: FIM - Bathing Bathing Steps Patient Completed: Chest;Left Arm;Abdomen;Right upper leg;Left upper leg Bathing: 1: Two helpers  FIM - Upper Body Dressing/Undressing Upper body dressing/undressing steps patient completed: Put head through opening of pull over shirt/dress Upper body dressing/undressing: 2: Max-Patient completed 25-49% of tasks FIM - Lower Body Dressing/Undressing Lower body dressing/undressing steps patient completed: Thread/unthread right underwear leg Lower body dressing/undressing: 1: Two helpers  FIM - Toileting Toileting: 0: Activity did not occur  FIM - Diplomatic Services operational officer Devices: Grab bars Toilet Transfers: 0-Activity did not occur  FIM - Banker Devices: Arm rests;Bed rails Bed/Chair Transfer: 3: Supine > Sit: Mod A (lifting assist/Pt. 50-74%/lift 2 legs;3: Sit > Supine: Mod A (lifting assist/Pt. 50-74%/lift 2 legs);3: Bed > Chair or W/C: Mod A (lift or lower  assist);3: Chair or W/C > Bed: Mod A (lift or lower assist)  FIM - Locomotion: Wheelchair Distance: 50' Locomotion: Wheelchair: 2: Travels 50 - 149 ft with moderate assistance (Pt: 50 - 74%) FIM - Locomotion: Ambulation Locomotion: Ambulation Assistive Devices:  (handrail in hallway) Ambulation/Gait Assistance: 1: +2 Total assist Locomotion: Ambulation: 1: Two helpers (chair follow)  Comprehension Comprehension Mode: Auditory Comprehension: 5-Follows basic conversation/direction: With no assist  Expression Expression Mode: Verbal Expression: 6-Expresses complex ideas: With extra time/assistive device  Social Interaction Social Interaction: 5-Interacts appropriately 90% of the time - Needs monitoring or encouragement for participation or interaction.  Problem Solving Problem Solving: 3-Solves basic 50 - 74% of the time/requires cueing 25 - 49% of the time  Memory Memory: 2-Recognizes or recalls 25 - 49% of the time/requires cueing 51 - 75% of the time  Medical Problem List and Plan:  1. 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 DVT Prophylaxis/Anticoagulation: SCDs. Monitor for any signs of DVT  3. Pain Management/chronic back pain related to metastatic lung cancer: Duragesic patch increased to home dose of  but now is more susceptible to SE given large CVA  norco 10/325 for breakthrough pain as well  4. Neuropsych: This patient is capable of making decisions on his own behalf.  5. Dysphagia. Dysphagia 2 nectar thick liquids. Monitor for any signs of aspiration. Followup speech therapy  6. Non-ST elevated myocardial infarction. off aspirin therapy. Followup cardiology services as needed. Low-dose beta blocker has been added  7. Thrombocytopenia. Aspirin has been held  Followup CBC. Hematology  services continues to follow, transuse plt if count <10K 8. Metastatic lung cancer. Followup oncology services Dr. Clelia Croft and radiation oncology Dr. Kathrynn Running. Patient received his first radiation treatment 02/14/2013 in addition to 3 cycle of chemotherapy last given 08/04/2013  9. Hyperlipidemia. Lipitor  10.  Urinary retention I/O cath, add Flomax 12.  Anemia, check stool guaic, transfused 2 Units PRBCs f/u Hgb improved 13. End of life issues/palliative care. Discussed the case with oncology. Prognosis is guarded. Expect less than 6 month life expectancy. Will involve palliative care to go her course of treatment as well as set up for hospice. Discussed reason for palliative consult with patient states he understood me and shook my hand LOS (Days) 7 A FACE TO FACE EVALUATION WAS PERFORMED  KIRSTEINS,ANDREW E 08/17/2013, 10:26 AM

## 2013-08-18 ENCOUNTER — Inpatient Hospital Stay (HOSPITAL_COMMUNITY): Payer: Medicare Other | Admitting: Rehabilitation

## 2013-08-18 ENCOUNTER — Inpatient Hospital Stay (HOSPITAL_COMMUNITY): Payer: Medicare Other | Admitting: Occupational Therapy

## 2013-08-18 ENCOUNTER — Inpatient Hospital Stay (HOSPITAL_COMMUNITY): Payer: Medicare Other | Admitting: Speech Pathology

## 2013-08-18 NOTE — Progress Notes (Signed)
Physical Therapy Weekly Progress Note  Patient Details  Name: Gabriel [name] MRN: [id number] Date of Birth: 1941/01/11  Today's Date: 08/18/2013 Time:  -     Patient has met 0 of 5 short term goals.  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 Because of the severity of the CVA he has actually regressed in all mobility, balance, and functional transfers. Currently he needs total assist for bed mobility and sitting balance, as well as total assist + 2 for all squat pivot transfers as well as standing. His left inattention has worsened as well as his ability to initiate any functional tasks with even the right hand. He demonstrates no functional movement in either the LLE and LUE at this time and exhibits pusher strategies to the left side in both sitting and standing. His discharge plan has changed secondary to this unfortunate event and family is pursuing SNF placement. Recommend continued PT treatment to help reduce burden of care and increase pt's overall disposition and performance of basic mobility tasks.    Patient continues to demonstrate the following deficits: decreased attention to L, decreased functional use of LUE/LLE, decreased initiation, decreased awareness of deficits, increased pain, decreased safety, decreased sitting static and dynamic balance and therefore will continue to benefit from skilled PT intervention to enhance overall performance with activity tolerance, balance, postural control, ability to compensate for deficits, functional use of  left upper extremity and left lower extremity, attention, awareness, coordination and knowledge of precautions.  Patient not progressing toward long term goals.  See goal revision..  Plan of care revisions: Pt now planning to D/C SNF due to second stroke while on in patient rehab.  .  PT Short Term Goals Week 1:  PT Short Term Goal 1 (Week 1): Pt will perform bed mobility at min assist with  attention to L arm during rolling PT Short Term Goal 1 - Progress (Week 1): Not met PT Short Term Goal 2 (Week 1): Pt will perform squat pivot transfers at mod assist.  PT Short Term Goal 2 - Progress (Week 1): Not met PT Short Term Goal 3 (Week 1): Pt will perform w/c mobility using R hemi technique x 50' at min assist to avoid obstacles on L side PT Short Term Goal 3 - Progress (Week 1): Not met PT Short Term Goal 4 (Week 1): Pt will perform dynamic standing balance at max assist.  PT Short Term Goal 4 - Progress (Week 1): Not met PT Short Term Goal 5 (Week 1): Pt will ambulate x 20' w/ LRAD at max assist  PT Short Term Goal 5 - Progress (Week 1): Not met Week 2:  PT Short Term Goal 1 (Week 2): Pt will perform bed mobility with attention to L side at max assist and mod cuing PT Short Term Goal 2 (Week 2): Pt will perform static sitting balance x 3 mins consistently at mod assist with head maintained at midline PT Short Term Goal 3 (Week 2): Pt will perform squat pivot transfer at max assist to L or R side.  PT Short Term Goal 4 (Week 2): Pt will perform sit <> stand at max assist  Skilled Therapeutic Interventions/Progress Updates:   Pt received sitting in reclining w/c this morning and reluctantly agreeable to therapy.  Assisted to gym and set up for transfer to mat table.  Pt able to state that brakes needed to be locked and that arm rest should be moved, however was  unable to remember that lap table should removed.  He was able to initiate moving LUE with RUE, however grabbed extremity by thumb.  Provided hand over hand cuing closer to pts elbow to increase assist.  Performed squat pivot transfer to/from w/c/mat table at +2 assist with max manual facilitation for increased forward trunk lean, forward weight shift, lateral weight shifts to scoot hips to front of chair, and at L LE to increase WB during transfer.  Once seated at [facility] performed sitting balance activity for increased abdominal,  traverse abdominal and erector musculature activation.  First task included reaching R for cones placed on hi/lo table (higher target) and progressed ring toss (lower target).  Activity also included retrieving or placing object to his L for increased weight bearing through L hip and increased attention L.  Pt requires max to total assist throughout activity, but did note activation of musculature at times.  Requires two reclined rest breaks on wedge due to increased back pain during session.  Pt returned to chair as mentioned above and returned to room with L half lap tray in place, chair reclined, quick release belt in place and all needs in reach.    Note pt still very unaware of deficits during mobility and states "I could do this if my bad didn't hurt so bad."    Therapy Documentation Precautions:  Precautions Precautions: Fall Precaution Comments: pusher, left hemiparesis with decreased motor planning Restrictions Weight Bearing Restrictions: No   Vital Signs: Therapy Vitals Temp: 98.1 F (36.7 C) Temp src: Oral Pulse Rate: 89 Resp: 18 BP: 125/63 mmHg Patient Position, if appropriate: Lying Oxygen Therapy SpO2: 97 % O2 Device: None (Room air) Pain: Pt with increased c/o back pain during session.  Provided rest breaks during activity to decrease pain.  See FIM for current functional status  Therapy/Group: Co-Treatment  Hadi Dubin, Meribeth Mattes 08/18/2013, 8:11 AM

## 2013-08-18 NOTE — Progress Notes (Signed)
Speech Language Pathology Weekly Progress Note  Patient Details  Name: Rilee Wendling MRN: [id number] Date of Birth: [birth year]  Today's Date: 08/18/2013 Time: 0835-0900 Time Calculation (min): 25 min  Short Term Goals: Week 1: SLP Short Term Goal 1 (Week 1): Patient will consume Dys.2 textures and thin liquids with Min verbal cues to recall and utilize safe swallow strategies to minimize s/s of aspiration. SLP Short Term Goal 1 - Progress (Week 1): Updated due to goal met SLP Short Term Goal 2 (Week 1): Patient will perform pharyngeal strengthening exercises with Min verbal cues. SLP Short Term Goal 2 - Progress (Week 1): Progressing toward goal SLP Short Term Goal 3 (Week 1): Patient will recall and utilize speech intelligibility strategies during a structured task with Min verbal cues. SLP Short Term Goal 3 - Progress (Week 1): Progressing toward goal SLP Short Term Goal 4 (Week 1): Patient will demonstrate moderately-complex problem solving during functional task with Mod verbal cues. SLP Short Term Goal 4 - Progress (Week 1): Discontinued (comment) (unrealistic given cva extension) SLP Short Term Goal 5 (Week 1): Patient will label 2 physical and 2 cognitive deficits that resulted from CVA with Min question cues. SLP Short Term Goal 5 - Progress (Week 1): Progressing toward goal SLP Short Term Goal 6 (Week 1): Patient will request help as needed with Mod question and verbal cues. SLP Short Term Goal 6 - Progress (Week 1): Met Week 2: SLP Short Term Goal 1 (Week 2): Patient will consume Dys.3 textures and nectar-thick liquids with Min verbal cues to recall and utilize safe swallow strategies to minimize s/s of aspiration. SLP Short Term Goal 2 (Week 2): Patient will perform pharyngeal strengthening exercises with Min verbal cues. SLP Short Term Goal 3 (Week 2): Patient will recall and utilize speech intelligibility strategies during a structured task with Min verbal cues. SLP Short Term  Goal 4 (Week 2): Patient will label 2 physical and 2 cognitive deficits that resulted from CVA with Min question cues. SLP Short Term Goal 5 (Week 2): Patient will request help as needed with Min question and verbal cues.  Weekly Progress Updates: Patient met 1 out of 6 short term goals this reporting period due to gains in patient requesting help as needed.  Other goals were modified or continued due to extension of patient's CVA this week.  Additionally, his medical prognosis and severity of deficits goals for problem solving will be modified to address basic activities.  Patient continues to require skilled SLP services to address these deficits as well as to continue to work toward least restrictive p.o. intake.  Palliative care consulted and SLP will modify goals again if needed based upon goals outlined.     SLP Intensity: Minumum of 1-2 x/day, 30 to 90 minutes SLP Frequency: 5 out of 7 days SLP Duration/Estimated Length of Stay: TBD SLP Treatment/Interventions: Cognitive remediation/compensation;Cueing hierarchy;Dysphagia/aspiration precaution training;Functional tasks;Internal/external aids;Oral motor exercises;Patient/family education;Speech/Language facilitation;Therapeutic Exercise;Therapeutic Activities  Daily Session  Skilled Intervention:  Skilled treatment session focused on addressing dysphagia goals. SLP facilitated session with set-up and Mod cues to complete oral care via suctioning as part of the water protocol prep procedures.  Patient consumed 4oz. water via cup with Mod cues to utilize 2 swallows, which were effective at preventing overt s/s of aspiration.  SLP also facilitated session with trials of Dys.3 textures with Min-Mod cues to recall and utilize lingual sweep and 2 swallows.  Continue with current plan of care and recommend SLP consider trial tray of  Dys.3 textures.  FIM:  Comprehension Comprehension Mode: Auditory Comprehension: 5-Follows basic  conversation/direction: With no assist Expression Expression Mode: Verbal Social Interaction Social Interaction: 5-Interacts appropriately 90% of the time - Needs monitoring or encouragement for participation or interaction. Problem Solving Problem Solving: 3-Solves basic 50 - 74% of the time/requires cueing 25 - 49% of the time Memory Memory: 2-Recognizes or recalls 25 - 49% of the time/requires cueing 51 - 75% of the time FIM - Eating Eating Activity: 5: Set-up assist for open containers;5: Needs verbal cues/supervision General  Amount of Missed SLP Time (min): 5 Minutes Pain Pain Assessment Pain Assessment: No/denies pain Pain Score: 5  Faces Pain Scale: Hurts little more Pain Type: Chronic pain Pain Location: Back Pain Orientation: Lower Pain Descriptors / Indicators: Aching Pain Frequency: Constant Pain Onset: On-going Patients Stated Pain Goal: 3 Pain Intervention(s): Medication (See eMAR) (Requested for Tyl.)  Therapy/Group: Individual Therapy  Charlane Ferretti., CCC-SLP [phone]  Orma Cheetham 08/18/2013, 4:26 PM

## 2013-08-18 NOTE — Progress Notes (Signed)
Occupational Therapy Note  Patient Details  Name: Gabriel [name] MRN: [id number] Date of Birth: 12-Oct-1941 Today's Date: 08/18/2013  [employer] with SLP  No c/o pain  1145-1205 Pt with limited PO intake but did not demonstrate any overt s/s of aspiration and required Min verbal and question cues for clearance of pocketed food and mod verbal cues for utilization of multiple swallows. He also required Min cues to attend to left visual field and to attend to left anterior spillage. Continue plan of care.     [name] [facility] 08/18/2013, 4:18 PM

## 2013-08-18 NOTE — Progress Notes (Signed)
Palliative Medicine Team consult to affirm goals of care, discuss hospice options requested by Dr Larna Daughters, writer stopped at room patient working with OT, spoke with wife Hilda Lias on c: [phone], meeting scheduled for tomorrow Friday 08/19/13 @ 12 noon, she voiced she and her husband had talked about this meeting and she would let him know today about scheduled time.   Valente David, RN 08/18/2013, 9:42 AM Palliative Medicine Team RN Liaison [phone]

## 2013-08-18 NOTE — Progress Notes (Signed)
Occupational Therapy Session Note  Patient Details  Name: Samel Bruna MRN: [id number] Date of Birth: 1941/06/25  Today's Date: 08/18/2013 Time: 1350-1420 Time Calculation (min): 30 min  Skilled Therapeutic Interventions/Progress Updates:    Provided handout to pt's wife and performed education on PROM to the LUE including the shoulder, elbow, wrist, and hand.  Pt's wife able to return demonstrate safe assistance of exercise.  Also provided education on positioning for pt in the bed as well.  Pt with increased head rotation to the right and relaxing in extension during session.  Therapist also performed gentle stretching to the sternocleidomastoid on the right side as well as performing passive rotation to the left side.  Also positioned rolled up towel on the right side of his head to promote positioning to midline and slightly to the left.    Therapy Documentation Precautions:  Precautions Precautions: Fall Precaution Comments: pusher, left hemiparesis with decreased motor planning Restrictions Weight Bearing Restrictions: No  Pain: Pain Assessment Pain Assessment: 0-10 Pain Score: 5  Faces Pain Scale: Hurts little more Pain Type: Chronic pain Pain Location: Back Pain Orientation: Lower Pain Descriptors / Indicators: Aching Pain Frequency: Constant Pain Onset: On-going Patients Stated Pain Goal: 3 Pain Intervention(s): Medication (See eMAR) (Requested for Tyl.) ADL: See FIM for current functional status  Therapy/Group: Individual Therapy  Shevelle Smither OTR/L 08/18/2013, 3:50 PM

## 2013-08-18 NOTE — Progress Notes (Signed)
Speech Language Pathology Daily Session Note  Patient Details  Name: Gabriel [name] MRN: [id number] Date of Birth: 1941-09-30  Today's Date: 08/18/2013 Time: 1130-1145 Time Calculation (min): 15 min  Short Term Goals: Week 1: SLP Short Term Goal 1 (Week 1): Patient will consume Dys.2 textures and thin liquids with Min verbal cues to recall and utilize safe swallow strategies to minimize s/s of aspiration. SLP Short Term Goal 2 (Week 1): Patient will perform pharyngeal strengthening exercises with Min verbal cues. SLP Short Term Goal 3 (Week 1): Patient will recall and utilize speech intelligibility strategies during a structured task with Min verbal cues. SLP Short Term Goal 4 (Week 1): Patient will demonstrate moderately-complex problem solving during functional task with Mod verbal cues. SLP Short Term Goal 5 (Week 1): Patient will label 2 physical and 2 cognitive deficits that resulted from CVA with Min question cues. SLP Short Term Goal 6 (Week 1): Patient will request help as needed with Mod question and verbal cues.  Skilled Therapeutic Interventions: Pt participated in co-treatment with OT in diners club with focus on dysphagia and cognitive goals. SLP facilitated session with skilled observation of diet tolerance with Dys. 2 textures and nectar-thick liquids. Pt with limited PO intake but did not demonstrate any overt s/s of aspiration and required Min verbal and question cues for clearance of pocketed food and mod verbal cues for utilization of multiple swallows. He also required Min cues to attend to left visual field and to attend to left anterior spillage. Continue plan of care.    FIM:  Comprehension Comprehension Mode: Auditory Comprehension: 5-Follows basic conversation/direction: With no assist Expression Expression Mode: Verbal Social Interaction Social Interaction: 5-Interacts appropriately 90% of the time - Needs monitoring or encouragement for participation or  interaction. Problem Solving Problem Solving: 3-Solves basic 50 - 74% of the time/requires cueing 25 - 49% of the time Memory Memory: 2-Recognizes or recalls 25 - 49% of the time/requires cueing 51 - 75% of the time FIM - Eating Eating Activity: 5: Set-up assist for open containers;5: Needs verbal cues/supervision  Pain Pain Assessment Pain Assessment: No/denies pain  Therapy/Group: Group Therapy  Sari Cogan 08/18/2013, 3:40 PM

## 2013-08-18 NOTE — Progress Notes (Signed)
Subjective/Complaints: Oriented to [facility] Back pain improving Review of Systems - Negative except left weakness  Objective: Vital Signs: Blood pressure 137/76, pulse 87, temperature 98.4 F (36.9 C), temperature source Axillary, resp. rate 18, height 5\' 10"  (1.778 m), weight 63.095 kg (139 lb 1.6 oz), SpO2 97.00%. No results found. Results for orders placed during the hospital encounter of 08/10/13 (from the past 72 hour(s))  TYPE AND SCREEN     Status: None   Collection Time    08/16/13 12:15 PM      Result Value Range   ABO/RH(D) A POS     Antibody Screen NEG     Sample Expiration 08/19/2013     Unit Number [id number]     Blood Component Type RED CELLS,LR     Unit division 00     Status of Unit ISSUED,FINAL     Transfusion Status OK TO TRANSFUSE     Crossmatch Result Compatible     Unit Number [id number]     Blood Component Type RED CELLS,LR     Unit division 00     Status of Unit ISSUED,FINAL     Transfusion Status OK TO TRANSFUSE     Crossmatch Result Compatible    PREPARE RBC (CROSSMATCH)     Status: None   Collection Time    08/16/13 12:15 PM      Result Value Range   Order Confirmation ORDER PROCESSED BY BLOOD BANK    ABO/RH     Status: None   Collection Time    08/16/13 12:15 PM      Result Value Range   ABO/RH(D) A POS    CBC     Status: Abnormal   Collection Time    08/17/13  3:40 AM      Result Value Range   WBC 10.1  4.0 - 10.5 K/uL   RBC 3.30 (*) 4.22 - 5.81 MIL/uL   Hemoglobin 10.6 (*) 13.0 - 17.0 g/dL   Comment: POST TRANSFUSION SPECIMEN   HCT 30.9 (*) 39.0 - 52.0 %   MCV 93.6  78.0 - 100.0 fL   MCH 32.1  26.0 - 34.0 pg   MCHC 34.3  30.0 - 36.0 g/dL   RDW 30.8 (*) 65.7 - 84.6 %   Platelets 17 (*) 150 - 400 K/uL   Comment: DELTA CHECK NOTED     SPECIMEN CHECKED FOR CLOTS     REPEATED TO VERIFY     CRITICAL VALUE NOTED.  VALUE IS CONSISTENT WITH PREVIOUSLY REPORTED AND CALLED VALUE.     CONSISTENT WITH PREVIOUS RESULT     HEENT:  normal Cardio: RRR and no murmur Resp: CTA B/L and unlabored GI: BS positive and non distended Extremity:  Pulses positive and No Edema Skin:   Intact Neuro: Alert/Oriented, Cranial Nerve Abnormalities Left central 7, Normal Sensory, Abnormal Motor 0/5 Left delt , bi, tri, grip, 0 in Left HF, KE, ADF, Abnormal FMC Ataxic/ dec FMC and Dysarthric, severe L neglect Musc/Skel:  No pain to palp over neck adn upper back, no deformity Gen NAD   Assessment/Plan: 1. Functional deficits secondary to Left hemiparesis R thrombotic periopercular CVA which require 3+ hours per day of interdisciplinary therapy in a comprehensive inpatient rehab setting. Physiatrist is providing close team supervision and 24 hour management of active medical problems listed below. Physiatrist and rehab team continue to assess barriers to discharge/monitor patient progress toward functional and medical goals. Marland Kitchen FIM: FIM - Bathing Bathing Steps Patient Completed: Chest;Left Arm;Abdomen;Right upper  leg;Left upper leg Bathing: 1: Two helpers  FIM - Upper Body Dressing/Undressing Upper body dressing/undressing steps patient completed: Thread/unthread right sleeve of pullover shirt/dresss Upper body dressing/undressing: 2: Max-Patient completed 25-49% of tasks FIM - Lower Body Dressing/Undressing Lower body dressing/undressing steps patient completed: Thread/unthread right underwear leg Lower body dressing/undressing: 1: Two helpers  FIM - Toileting Toileting: 0: Activity did not occur  FIM - Diplomatic Services operational officer Devices: Therapist, music Transfers: 0-Activity did not occur  FIM - Banker Devices: Arm rests Bed/Chair Transfer: 1: Two helpers  FIM - Locomotion: Wheelchair Distance: 50' Locomotion: Wheelchair: 0: Activity did not occur FIM - Locomotion: Ambulation Locomotion: Ambulation Assistive Devices:  (handrail in hallway) Ambulation/Gait  Assistance: 1: +2 Total assist Locomotion: Ambulation: 0: Activity did not occur  Comprehension Comprehension Mode: Auditory Comprehension: 5-Follows basic conversation/direction: With no assist  Expression Expression Mode: Verbal Expression: 6-Expresses complex ideas: With extra time/assistive device  Social Interaction Social Interaction: 5-Interacts appropriately 90% of the time - Needs monitoring or encouragement for participation or interaction.  Problem Solving Problem Solving: 3-Solves basic 50 - 74% of the time/requires cueing 25 - 49% of the time  Memory Memory: 2-Recognizes or recalls 25 - 49% of the time/requires cueing 51 - 75% of the time  Medical Problem List and Plan:  1. 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 DVT Prophylaxis/Anticoagulation: SCDs. Monitor for any signs of DVT  3. Pain Management/chronic back pain related to metastatic lung cancer: Duragesic patch increased to home dose of but now is more susceptible to SE given large CVA  norco 10/325 for breakthrough pain as well  4. Neuropsych: This patient is capable of making decisions on his own behalf.  5. Dysphagia. Dysphagia 2 nectar thick liquids. Monitor for any signs of aspiration. Followup speech therapy  6. Non-ST elevated myocardial infarction. off aspirin therapy. Followup cardiology services as needed. Low-dose beta blocker has been added  7. Thrombocytopenia. Aspirin has been held  Followup CBC. Hematology services continues to follow, transuse plt if count <10K 8. Metastatic lung cancer. Followup oncology services Dr. Clelia Croft and radiation oncology Dr. Kathrynn Running. Patient received his first radiation treatment 02/14/2013 in addition to 3 cycle of chemotherapy last given 08/04/2013  9. Hyperlipidemia. Lipitor  10.   Urinary retention I/O cath, add Flomax 12.  Anemia, check stool guaic, transfused 2 Units PRBCs f/u Hgb improved 13. End of life issues/palliative care. Discussed the case with oncology. Prognosis is guarded. Expect less than 6 month life expectancy. Palliative mtg in am   LOS (Days) 8 A FACE TO FACE EVALUATION WAS PERFORMED  KIRSTEINS,ANDREW E 08/18/2013, 5:25 PM

## 2013-08-18 NOTE — Progress Notes (Signed)
Occupational Therapy Session Note  Patient Details  Name: Gabriel [name] MRN: [id number] Date of Birth: 1941-10-12  Today's Date: 08/18/2013 Time: 0903-1001 Time Calculation (min): 58 min  Short Term Goals: Week 2:  OT Short Term Goal 1 (Week 2): Pt will maintain sitting balance statically EOB with max assist in preparation for selfcare tasks.   OT Short Term Goal 2 (Week 2): Pt will initiate and complete UB bathing with no more than min assist and min instructional cueing.   OT Short Term Goal 3 (Week 2): Pt will perform LB bathing with max assist sit to stand.  OT Short Term Goal 4 (Week 2): Pt will position the LUE in his lap with no more than mod instructional cueing before or after a transitional movement.    Skilled Therapeutic Interventions/Progress Updates:    Pt performed bathing and dressing during session.  He needed total assist for transfer from supine to sit EOB and to maintain sitting balance while attempting UB bathing and dressing.  He attempted to initiate forward trunk flexion for reaching the washcloth but still needed total assist.  Demonstrates pushing to the left in sitting so therapist had pt take breaks and come down on to his right forearm to help decrease this.  When supported he is able to wash his abdomen and chest as well as part of his left arm.  He also could wash his upper legs and part of his lower legs if therapist helped him cross the LLE over the right and provided total assist for trunk support.  Pt returned to supine position for washing peri area and donning pants/ brief.  Pt with head rotation to the right in supine and needs mod facilitation to turn to the left for locating the bed rail and initiating rolling.  He needed total assist for rolling to the right and max assist to roll to the left.  Transferred pt to reclining wheelchair with total assist at end of session to help support his trunk.  Therapy Documentation Precautions:  Precautions Precautions:  Fall Precaution Comments: pusher, left hemiparesis with decreased motor planning Restrictions Weight Bearing Restrictions: No  Pain: Pain Assessment Pain Assessment: No/denies pain Pain Score: 0-No pain ADL: See FIM for current functional status  Therapy/Group: Individual Therapy  Charliene Inoue OTR/L 08/18/2013, 12:12 PM

## 2013-08-19 ENCOUNTER — Inpatient Hospital Stay (HOSPITAL_COMMUNITY): Payer: Medicare Other | Admitting: Rehabilitation

## 2013-08-19 ENCOUNTER — Inpatient Hospital Stay (HOSPITAL_COMMUNITY): Payer: Medicare Other

## 2013-08-19 ENCOUNTER — Inpatient Hospital Stay (HOSPITAL_COMMUNITY): Payer: Medicare Other | Admitting: Speech Pathology

## 2013-08-19 ENCOUNTER — Inpatient Hospital Stay (HOSPITAL_COMMUNITY): Payer: Medicare Other | Admitting: Occupational Therapy

## 2013-08-19 DIAGNOSIS — I6330 Cerebral infarction due to thrombosis of unspecified cerebral artery: Secondary | ICD-10-CM

## 2013-08-19 DIAGNOSIS — I6350 Cerebral infarction due to unspecified occlusion or stenosis of unspecified cerebral artery: Secondary | ICD-10-CM

## 2013-08-19 DIAGNOSIS — D6959 Other secondary thrombocytopenia: Secondary | ICD-10-CM

## 2013-08-19 DIAGNOSIS — C412 Malignant neoplasm of vertebral column: Secondary | ICD-10-CM

## 2013-08-19 DIAGNOSIS — I214 Non-ST elevation (NSTEMI) myocardial infarction: Secondary | ICD-10-CM

## 2013-08-19 DIAGNOSIS — C399 Malignant neoplasm of lower respiratory tract, part unspecified: Secondary | ICD-10-CM

## 2013-08-19 NOTE — Progress Notes (Signed)
Occupational Therapy Session Note  Patient Details  Name: Gabriel [name] MRN: [id number] Date of Birth: 1941/09/13  Today's Date: 08/19/2013 Time: 4098-1191 Time Calculation (min): 31 min  Short Term Goals: Week 2:  OT Short Term Goal 1 (Week 2): Pt will maintain sitting balance statically EOB with max assist in preparation for selfcare tasks.   OT Short Term Goal 2 (Week 2): Pt will initiate and complete UB bathing with no more than min assist and min instructional cueing.   OT Short Term Goal 3 (Week 2): Pt will perform LB bathing with max assist sit to stand.  OT Short Term Goal 4 (Week 2): Pt will position the LUE in his lap with no more than mod instructional cueing before or after a transitional movement.    Skilled Therapeutic Interventions/Progress Updates:    Pt worked on bed mobility during session.  Currently needs max assist for rolling to the left side with max instructional cueing for sequencing bending up the right knee, turning his head to the left, and reaching for the rail.  Pt with increased fear of rolling and tends to push on the rail once he reaches sidelying.  Pt needs mod assist to perform cervical rotation to the left to locate the rail as well.  He needs total assist to roll to the right including max instructional cueing for sequencing the right arm and leg.   Sat EOB with total assist as well.  Had pt work on reaching laterally to the right to activate his trunk.  Encouraged pt to reach down to the foot of the bed for 10 repetitions and then back to midline.  Pt still pushing with the RUE in sitting if he is allowed to place his hand with the palm down in his lap on beside him on the bed.  Also worked on forward trunk flexion reaching for washcloth but needed total assist to regain balance once he reached forward.  Returned to supine at end of the session and positioned pillows and rolled up towel on the right side to help position his head to the left as he tends to keep  it rotated to the right and laterally flexed.  Therapy Documentation Precautions:  Precautions Precautions: Fall Precaution Comments: pusher, left hemiparesis with decreased motor planning Restrictions Weight Bearing Restrictions: No  Pain: Pain Assessment Pain Assessment: No/denies pain  See FIM for current functional status  Therapy/Group: Individual Therapy  Navon Kotowski OTR/L 08/19/2013, 3:33 PM

## 2013-08-19 NOTE — Progress Notes (Signed)
Physical Therapy Session Note  Patient Details  Name: Gabriel [name] MRN: [id number] Date of Birth: November 23, 1940  Today's Date: 08/19/2013 Time: 7829-5621 Time Calculation (min): 35 min  Short Term Goals: Week 2:  PT Short Term Goal 1 (Week 2): Pt will perform bed mobility with attention to L side at max assist and mod cuing PT Short Term Goal 2 (Week 2): Pt will perform static sitting balance x 3 mins consistently at mod assist with head maintained at midline PT Short Term Goal 3 (Week 2): Pt will perform squat pivot transfer at max assist to L or R side.  PT Short Term Goal 4 (Week 2): Pt will perform sit <> stand at max assist  Skilled Therapeutic Interventions/Progress Updates:   Pt lying in bed, having just gotten back from xray.  Agreeable to bed level exercises and getting into recliner at end of session to allow wife to shave him.  Performed 1 set of 10 reps PROM on LLE; hip and knee flex, ankle DF/PF, and hip IR and ER.  Following reach ROM with stretch x 30 secs each.  Provided education for pt, however expect little carryover and will discuss with wife when she is present.  Performed supine BLE bridging x 10 reps with assist for placement of LLE.  Performed passive stretching to pts R SCM muscle and also R levator/upper traps x 30 secs each.  Performed supine to L SL at max assist as pt did initiate reaching for rail today and also managed RLE with cues only.  Requires total assist for SL to sit and max cues for releasing grip from handrail during transition.  Once in sitting, requires total to mod assist to maintain upright posture.  Note he was able to reach for R handrail to decrease pusher tendencies, however was unable to sustain and quickly requires total assist to maintain upright posture.  Wife arrived during session and had her don pts shoes in preparation for transfer.  Set up drop arm recliner for transfer to pts R.  Again requires total assist for transfer with wife assisting to  keep pts R hand from pushing on chair, preventing transfer.  Pt positioned in chair with legs up, reclined, pillows placed on L side for support, also placed on R side to decrease pushing to L.  Pillows placed under buttocks prior to transfer and behind back following transfer.  All needs in reach and quick release belt donned.   Therapy Documentation Precautions:  Precautions Precautions: Fall Precaution Comments: pusher, left hemiparesis with decreased motor planning Restrictions Weight Bearing Restrictions: No General: Amount of Missed PT Time (min): 10 Minutes Missed Time Reason: X-Ray Vital Signs: Therapy Vitals Temp: 97.6 F (36.4 C) Temp src: Oral Pulse Rate: 84 Pain: Pain Assessment Pain Assessment: 0-10 Pain Score: 4  Pain Type: Chronic pain Pain Location: Back Pain Orientation: Lower Pain Descriptors / Indicators: Aching Pain Onset: On-going Patients Stated Pain Goal: 3 Pain Intervention(s): Medication (See eMAR) Multiple Pain Sites: No PAINAD (Pain Assessment in Advanced Dementia) Breathing: normal  See FIM for current functional status  Therapy/Group: Individual Therapy  [name] 08/19/2013, 12:32 PM

## 2013-08-19 NOTE — Progress Notes (Signed)
Speech Language Pathology Daily Session Note  Patient Details  Name: Gabriel [name] MRN: [id number] Date of Birth: Jul 01, 1941  Today's Date: 08/19/2013 Time:0830-0900,  4098-1191 Time Calculation (min): 30 min  Short Term Goals: Week 2: SLP Short Term Goal 1 (Week 2): Patient will consume Dys.3 textures and nectar-thick liquids with Min verbal cues to recall and utilize safe swallow strategies to minimize s/s of aspiration. SLP Short Term Goal 2 (Week 2): Patient will perform pharyngeal strengthening exercises with Min verbal cues. SLP Short Term Goal 3 (Week 2): Patient will recall and utilize speech intelligibility strategies during a structured task with Min verbal cues. SLP Short Term Goal 4 (Week 2): Patient will label 2 physical and 2 cognitive deficits that resulted from CVA with Min question cues. SLP Short Term Goal 5 (Week 2): Patient will request help as needed with Min question and verbal cues.    Skilled Therapeutic Interventions: [phone] (Visit 1) Skilled treatment focused on addressing dysphagia goals, with SLP providing supervision, setup, and verbal cues to direct patient in initiation and maintaining intake of breakfast meal, and to utilize swallow safety strategies (swallow twice after each bite). Patient consumed approximately 25% of meal. Mild anterior spillage of nectar thick liquids noted post swallow, and patient required verbal cues to clear throat and reswallow, but no coughing noted, and patient appears to be tolerating solid and liquid consistencies well.   1308-6578 (Visit 2) Skilled treatment focused on addressing dysphagia goals during lunch meal in a small group (3 patients) setting. Patient required setup assistance, prompts and encouragement to initiate and maintain consumption of meal, and verbal and tactile cues to slow down rate of intake with solid textures, swallow two times with each bite of solids, and to manage anterior spillage and residuals  remaining in oral cavity post swallows. Patient exhibited intermittent wet/gurgled vocal quality, which cleared when patient performed cued throat clear/cough and re-swallow.  FIM:  Comprehension Comprehension Mode: Auditory Comprehension: 5-Understands basic 90% of the time/requires cueing < 10% of the time Expression Expression Mode: Verbal Expression: 5-Expresses basic needs/ideas: With extra time/assistive device Social Interaction Social Interaction: 4-Interacts appropriately 75 - 89% of the time - Needs redirection for appropriate language or to initiate interaction. Problem Solving Problem Solving: 3-Solves basic 50 - 74% of the time/requires cueing 25 - 49% of the time Memory Memory: 2-Recognizes or recalls 25 - 49% of the time/requires cueing 51 - 75% of the time FIM - Eating Eating Activity: 5: Set-up assist for open containers;5: Needs verbal cues/supervision  Pain Pain Assessment Pain Assessment: 0-10 Pain Score: 5  Pain Type: Chronic pain Pain Location: Back Pain Orientation: Lower Pain Descriptors / Indicators: Aching Pain Onset: On-going Pain Intervention(s): RN made aware  Therapy/Group: Individual Therapy and Dysphagia Group  Pablo Lawrence 08/19/2013, 3:56 PM  Angela Nevin, MA, CCC-SLP [facility] Speech-Language Pathologist

## 2013-08-19 NOTE — Progress Notes (Signed)
Occupational Therapy Note  Patient Details  Name: Gabriel [name] MRN: [id number] Date of Birth: [birth year] Today's Date: 08/19/2013  [employer] with SLP [phone]  Self feeding group with focus on increasing Po intake with smaller size plate for encouragement, attention to left,etc. Pt required max cuing to maintain swallowing precautions of slow pace and 2 swallows after each bite. Pt with c/o being uncomfortable in chair - needing to be repositioned 4 -5 times for better comfort to attend to meal.    Adan Sis 08/19/2013, 2:50 PM

## 2013-08-19 NOTE — Plan of Care (Signed)
Problem: RH BOWEL ELIMINATION Goal: RH STG MANAGE BOWEL WITH ASSISTANCE STG Manage Bowel with minimal Assistance.  Outcome: Not Progressing Total assist Goal: RH STG MANAGE BOWEL W/MEDICATION W/ASSISTANCE STG Manage Bowel with Medication with minimal Assistance.  Outcome: Not Progressing Total assist  Problem: RH BLADDER ELIMINATION Goal: RH OTHER STG BLADDER ELIMINATION GOALS W/ASSIST Other STG Bladder Elimination Goals With minimal Assistance  Outcome: Not Progressing Foley in place

## 2013-08-19 NOTE — Progress Notes (Signed)
Social Work Patient ID: Gabriel [name], male   DOB: 06/30/1941, 72 y.o.   MRN: [id number] Met with wife and her daughter to discuss discharge plan.  Wife is still wanting to pursue [facility] SN[facility] since she has had family members there and it is right next Door to her home.  She has met with Palliative care MD and other family members to meet with them Sunday for questions and clarity.  Will continue to work on discharge plan, wife Agreeable for this worker to send FL2 to [facility] since usually a waiting list to get in there.

## 2013-08-19 NOTE — Progress Notes (Signed)
Occupational Therapy Session Note  Patient Details  Name: Gabriel [name] MRN: [id number] Date of Birth: December 30, 1940  Today's Date: 08/19/2013 Time: 0903-1000 Time Calculation (min): 57 min  Short Term Goals: Week 2:  OT Short Term Goal 1 (Week 2): Pt will maintain sitting balance statically EOB with max assist in preparation for selfcare tasks.   OT Short Term Goal 2 (Week 2): Pt will initiate and complete UB bathing with no more than min assist and min instructional cueing.   OT Short Term Goal 3 (Week 2): Pt will perform LB bathing with max assist sit to stand.  OT Short Term Goal 4 (Week 2): Pt will position the LUE in his lap with no more than mod instructional cueing before or after a transitional movement.    Skilled Therapeutic Interventions/Progress Updates:    Pt performed bathing and dressing during session. He needed total assist for transfer from supine to sit EOB and to maintain sitting balance while attempting UB bathing and dressing. He attempted to initiate forward trunk flexion for reaching the washcloth but still needed total assist. Demonstrates pushing to the left in sitting so therapist had pt take breaks and come down on to his right forearm to help decrease this. When supported he is able to wash his abdomen and chest as well as part of his left arm. He also could wash his upper legs and part of his lower legs if therapist helped him cross the LLE over the right and provided total assist for trunk support. Pt returned to supine position for washing peri area and donning pants/ brief. Pt with head rotation to the right in supine and needs mod facilitation to turn to the left for locating the bed rail and initiating rolling. He needed total assist for rolling to the right and max assist to roll to the left.  Positioned pillows and towel roll on the right side of his head to assist with positioning cervical rotation at midline.     Therapy Documentation Precautions:   Precautions Precautions: Fall Precaution Comments: pusher, left hemiparesis with decreased motor planning Restrictions Weight Bearing Restrictions: No  Pain: Pain Assessment Pain Assessment: 0-10 Pain Score: 4  Pain Type: Chronic pain Pain Location: Back Pain Orientation: Lower Pain Descriptors / Indicators: Aching Pain Onset: On-going Patients Stated Pain Goal: 3 Pain Intervention(s): Medication (See eMAR) Multiple Pain Sites: No PAINAD (Pain Assessment in Advanced Dementia) Breathing: normal ADL: See FIM for current functional status  Therapy/Group: Individual Therapy  Jackelyn Illingworth OTR/L 08/19/2013, 12:16 PM

## 2013-08-19 NOTE — Progress Notes (Signed)
Subjective/Complaints: Oriented to [facility] Upper Back pain improving, now c/o low back pain.  Kpad on upper back Review of Systems - Negative except left weakness  Objective: Vital Signs: Blood pressure 132/67, pulse 84, temperature 98.3 F (36.8 C), temperature source Oral, resp. rate 16, height 5\' 10"  (1.778 m), weight 63.095 kg (139 lb 1.6 oz), SpO2 97.00%. No results found. Results for orders placed during the hospital encounter of 08/10/13 (from the past 72 hour(s))  TYPE AND SCREEN     Status: None   Collection Time    08/16/13 12:15 PM      Result Value Range   ABO/RH(D) A POS     Antibody Screen NEG     Sample Expiration 08/19/2013     Unit Number [id number]     Blood Component Type RED CELLS,LR     Unit division 00     Status of Unit ISSUED,FINAL     Transfusion Status OK TO TRANSFUSE     Crossmatch Result Compatible     Unit Number [id number]     Blood Component Type RED CELLS,LR     Unit division 00     Status of Unit ISSUED,FINAL     Transfusion Status OK TO TRANSFUSE     Crossmatch Result Compatible    PREPARE RBC (CROSSMATCH)     Status: None   Collection Time    08/16/13 12:15 PM      Result Value Range   Order Confirmation ORDER PROCESSED BY BLOOD BANK    ABO/RH     Status: None   Collection Time    08/16/13 12:15 PM      Result Value Range   ABO/RH(D) A POS    CBC     Status: Abnormal   Collection Time    08/17/13  3:40 AM      Result Value Range   WBC 10.1  4.0 - 10.5 K/uL   RBC 3.30 (*) 4.22 - 5.81 MIL/uL   Hemoglobin 10.6 (*) 13.0 - 17.0 g/dL   Comment: POST TRANSFUSION SPECIMEN   HCT 30.9 (*) 39.0 - 52.0 %   MCV 93.6  78.0 - 100.0 fL   MCH 32.1  26.0 - 34.0 pg   MCHC 34.3  30.0 - 36.0 g/dL   RDW 96.2 (*) 95.2 - 84.1 %   Platelets 17 (*) 150 - 400 K/uL   Comment: DELTA CHECK NOTED     SPECIMEN CHECKED FOR CLOTS     REPEATED TO VERIFY     CRITICAL VALUE NOTED.  VALUE IS CONSISTENT WITH PREVIOUSLY REPORTED AND CALLED VALUE.     CONSISTENT  WITH PREVIOUS RESULT     HEENT: normal Cardio: RRR and no murmur Resp: CTA B/L and unlabored GI: BS positive and non distended Extremity:  Pulses positive and No Edema Skin:   Intact Neuro: Alert/Oriented, Cranial Nerve Abnormalities Left central 7, Normal Sensory, Abnormal Motor 0/5 Left delt , bi, tri, grip, 0 in Left HF, KE, ADF, Abnormal FMC Ataxic/ dec FMC and Dysarthric, severe L neglect Musc/Skel:  No pain to palp over neck adn upper back, no deformity Gen NAD   Assessment/Plan: 1. Functional deficits secondary to Left hemiparesis R thrombotic periopercular CVA which require 3+ hours per day of interdisciplinary therapy in a comprehensive inpatient rehab setting. Physiatrist is providing close team supervision and 24 hour management of active medical problems listed below. Physiatrist and rehab team continue to assess barriers to discharge/monitor patient progress toward functional and medical goals. Palliative  to meet with Pt and wife today Stable for transfer to SNF       FIM: FIM - Bathing Bathing Steps Patient Completed: Chest;Left Arm;Abdomen;Right upper leg;Left upper leg Bathing: 1: Two helpers  FIM - Upper Body Dressing/Undressing Upper body dressing/undressing steps patient completed: Thread/unthread right sleeve of pullover shirt/dresss Upper body dressing/undressing: 2: Max-Patient completed 25-49% of tasks FIM - Lower Body Dressing/Undressing Lower body dressing/undressing steps patient completed: Thread/unthread right underwear leg Lower body dressing/undressing: 1: Two helpers  FIM - Toileting Toileting: 0: Activity did not occur  FIM - Diplomatic Services operational officer Devices: Therapist, music Transfers: 0-Activity did not occur  FIM - Banker Devices: Arm rests Bed/Chair Transfer: 1: Two helpers  FIM - Locomotion: Wheelchair Distance: 50' Locomotion: Wheelchair: 0: Activity did not occur FIM -  Locomotion: Ambulation Locomotion: Ambulation Assistive Devices:  (handrail in hallway) Ambulation/Gait Assistance: 1: +2 Total assist Locomotion: Ambulation: 0: Activity did not occur  Comprehension Comprehension Mode: Auditory Comprehension: 5-Follows basic conversation/direction: With no assist  Expression Expression Mode: Verbal Expression: 6-Expresses complex ideas: With extra time/assistive device  Social Interaction Social Interaction: 5-Interacts appropriately 90% of the time - Needs monitoring or encouragement for participation or interaction.  Problem Solving Problem Solving: 3-Solves basic 50 - 74% of the time/requires cueing 25 - 49% of the time  Memory Memory: 2-Recognizes or recalls 25 - 49% of the time/requires cueing 51 - 75% of the time  Medical Problem List and Plan:  1. 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 DVT Prophylaxis/Anticoagulation: SCDs. Monitor for any signs of DVT  3. Pain Management/chronic back pain related to metastatic lung cancer: Duragesic patch increased to home dose of but now is more susceptible to SE given large CVA  norco 10/325 for breakthrough pain as well  4. Neuropsych: This patient is capable of making decisions on his own behalf.  5. Dysphagia. Dysphagia 2 nectar thick liquids. Monitor for any signs of aspiration. Followup speech therapy  6. Non-ST elevated myocardial infarction. off aspirin therapy. Followup cardiology services as needed. Low-dose beta blocker has been added  7. Thrombocytopenia. Aspirin has been held  Followup CBC. Hematology services continues to follow, transuse plt if count <10K 8. Metastatic lung cancer. Followup oncology services Dr. Clelia Croft and radiation oncology Dr. Kathrynn Running. Patient received his first radiation treatment  02/14/2013 in addition to 3 cycle of chemotherapy last given 08/04/2013  9. Hyperlipidemia. Lipitor  10.  Urinary retention I/O cath, add Flomax 12.  Anemia, check stool guaic, transfused 2 Units PRBCs f/u Hgb improved 13. End of life issues/palliative care. Discussed the case with oncology. Prognosis is guarded. Expect less than 6 month life expectancy. Palliative mtg today   LOS (Days) 9 A FACE TO FACE EVALUATION WAS PERFORMED  KIRSTEINS,ANDREW E 08/19/2013, 8:22 AM

## 2013-08-20 ENCOUNTER — Inpatient Hospital Stay (HOSPITAL_COMMUNITY): Payer: Medicare Other | Admitting: *Deleted

## 2013-08-20 ENCOUNTER — Encounter (HOSPITAL_COMMUNITY): Payer: Medicare Other | Admitting: Speech Pathology

## 2013-08-20 MED ORDER — SODIUM CHLORIDE 0.9 % IV SOLN
90.0000 mg | Freq: Once | INTRAVENOUS | Status: AC
  Administered 2013-08-20: 90 mg via INTRAVENOUS
  Filled 2013-08-20: qty 10

## 2013-08-20 MED ORDER — ZOLEDRONIC ACID 4 MG/5ML IV CONC: 4.0000 mg | Freq: Once | INTRAVENOUS | Status: DC

## 2013-08-20 MED ORDER — CELECOXIB 100 MG PO CAPS
100.0000 mg | ORAL_CAPSULE | Freq: Two times a day (BID) | ORAL | Status: DC
  Administered 2013-08-20 – 2013-08-24 (×8): 100 mg via ORAL
  Filled 2013-08-20 (×10): qty 1

## 2013-08-20 MED ORDER — DEXAMETHASONE 4 MG PO TABS
4.0000 mg | ORAL_TABLET | Freq: Two times a day (BID) | ORAL | Status: DC
  Administered 2013-08-20 – 2013-08-24 (×8): 4 mg via ORAL
  Filled 2013-08-20 (×10): qty 1

## 2013-08-20 MED ORDER — FLEET ENEMA 7-19 GM/118ML RE ENEM
1.0000 | ENEMA | Freq: Every day | RECTAL | Status: DC | PRN
  Administered 2013-08-23: 1 via RECTAL
  Filled 2013-08-20: qty 1

## 2013-08-20 MED ORDER — FAMOTIDINE 20 MG PO TABS
20.0000 mg | ORAL_TABLET | Freq: Two times a day (BID) | ORAL | Status: DC
  Administered 2013-08-20 – 2013-08-24 (×8): 20 mg via ORAL
  Filled 2013-08-20 (×10): qty 1

## 2013-08-20 MED ORDER — LORAZEPAM 0.5 MG PO TABS: 0.5000 mg | ORAL_TABLET | Freq: Four times a day (QID) | ORAL | Status: DC | PRN

## 2013-08-20 NOTE — Progress Notes (Signed)
Subjective/Complaints: Oriented to [facility] No back pain today. Review of Systems - Negative except left weakness  Objective: Vital Signs: Blood pressure 137/68, pulse 93, temperature 98.6 F (37 C), temperature source Oral, resp. rate 18, height 5\' 10"  (1.778 m), weight 63.095 kg (139 lb 1.6 oz), SpO2 98.00%. Dg Lumbar Spine 2-3 Views  08/19/2013   CLINICAL DATA:  Low back pain after fall, lung cancer with lumbar spine metastases.  EXAM: LUMBAR SPINE - 2-3 VIEW  COMPARISON:  Lumbar MRI 06/22/2013  FINDINGS: Normal alignment of the lumbar vertebral bodies. There is multiple levels of endplate osteophytosis. No significant disc space loss. The skeletal metastasis seen on comparison MRI are not as well demonstrated. Severe osteophytosis noted.  IMPRESSION: 1. No acute findings lumbar spine. 2. Severe multilevel osteophytosis. 3. Vertebral body metastasis are not as well demonstrated as on comparison MRI.   Electronically Signed   By: Genevive Bi M.D.   On: 08/19/2013 10:52   No results found for this or any previous visit (from the past 72 hour(s)).   HEENT: normal Cardio: RRR and no murmur Resp: CTA B/L and unlabored GI: BS positive and non distended Extremity:  Pulses positive and No Edema Skin:   Intact Neuro: Alert/Oriented, Cranial Nerve Abnormalities Left central 7, Normal Sensory, Abnormal Motor 0/5 Left delt , bi, tri, grip, 0 in Left HF, KE, ADF, Abnormal FMC Ataxic/ dec FMC and Dysarthric, severe L neglect Musc/Skel:  No pain to palp over neck adn upper back, no deformity Gen NAD   Assessment/Plan: 1. Functional deficits secondary to Left hemiparesis R thrombotic periopercular CVA which require 3+ hours per day of interdisciplinary therapy in a comprehensive inpatient rehab setting. Physiatrist is providing close team supervision and 24 hour management of active medical problems listed below. Physiatrist and rehab team continue to assess barriers to discharge/monitor patient  progress toward functional and medical goals.  Stable for transfer to SNF                      FIM - Bathing Bathing Steps Patient Completed: Chest;Abdomen;Right upper leg;Left upper leg;Front perineal area Bathing: 3: Mod-Patient completes 5-7 60f 10 parts or 50-74% (Bathing in supported sitting and supine)  FIM - Upper Body Dressing/Undressing Upper body dressing/undressing steps patient completed: Pull shirt over trunk Upper body dressing/undressing: 2: Max-Patient completed 25-49% of tasks FIM - Lower Body Dressing/Undressing Lower body dressing/undressing steps patient completed: Thread/unthread right underwear leg Lower body dressing/undressing: 1: Total-Patient completed less than 25% of tasks  FIM - Toileting Toileting: 0: Activity did not occur  FIM - Diplomatic Services operational officer Devices: Grab bars Toilet Transfers: 0-Activity did not occur  FIM - Banker Devices: Arm rests Bed/Chair Transfer: 1: Supine > Sit: Total A (helper does all/Pt. < 25%);1: Bed > Chair or W/C: Total A (helper does all/Pt. < 25%)  FIM - Locomotion: Wheelchair Distance: 50' Locomotion: Wheelchair: 0: Activity did not occur FIM - Locomotion: Ambulation Locomotion: Ambulation Assistive Devices:  (handrail in hallway) Ambulation/Gait Assistance: 1: +2 Total assist Locomotion: Ambulation: 0: Activity did not occur  Comprehension Comprehension Mode: Auditory Comprehension: 5-Understands basic 90% of the time/requires cueing < 10% of the time  Expression Expression Mode: Verbal Expression: 5-Expresses basic needs/ideas: With extra time/assistive device  Social Interaction Social Interaction: 4-Interacts appropriately 75 - 89% of the time - Needs redirection for appropriate language or to initiate interaction.  Problem Solving Problem Solving: 3-Solves basic 50 - 74% of the time/requires  cueing 25 - 49% of the time  Memory Memory:  2-Recognizes or recalls 25 - 49% of the time/requires cueing 51 - 75% of the time  Medical Problem List and Plan:  1. Thrombotic right periopercular region, subinsular region and lenticular nucleus and right caudate infarct with extension to involve most of R MCA distribution and ACA and increased left hemiparesis, left neglect-  2. DVT Prophylaxis/Anticoagulation: SCDs. Monitor for any signs of DVT  3. Pain Management/chronic back pain related to metastatic lung cancer: Duragesic patch increased to home dose of but now is more susceptible to SE given large CVA  norco 10/325 for breakthrough pain as well  4. Neuropsych: This patient is capable of making decisions on his own behalf.  5. Dysphagia. Dysphagia 2 nectar thick liquids. Monitor for any signs of aspiration. Followup speech therapy  6. Non-ST elevated myocardial infarction. off aspirin therapy. Followup cardiology services as needed. Low-dose beta blocker has been added  7. Thrombocytopenia. Aspirin has been held  Followup CBC. Hematology services continues to follow, transuse plt if count <10K 8. Metastatic lung cancer. Followup oncology services Dr. Clelia Croft and radiation oncology Dr. Kathrynn Running. Patient received his first radiation treatment 02/14/2013 in addition to 3 cycle of chemotherapy last given 08/04/2013  9. Hyperlipidemia. Lipitor  10.  Urinary retention I/O cath, add Flomax 12.  Anemia, check stool guaic, transfused 2 Units PRBCs f/u Hgb improved 13. End of life issues/palliative care. Discussed the case with oncology. Prognosis is guarded. Expect less than 6 month life expectancy. Palliative consult appreciated  LOS (Days) 10 A FACE TO FACE EVALUATION WAS PERFORMED  KIRSTEINS,ANDREW E 08/20/2013, 11:03 AM

## 2013-08-20 NOTE — Progress Notes (Signed)
Occupational Therapy Session Note  Patient Details  Name: Gabriel [name] MRN: [id number] Date of Birth: [birth year]  Today's Date: 08/20/2013 Time: 1308-6578 Time Calculation (min): 15 min  Skilled Therapeutic Interventions: [employer] with SLP. Focus on increasing PO intake, swallowing 2 times with each bite before proceeding, and attention to left,etc. Pt required min cuing to maintain swallowing precautions of slow pace and 2 swallows after each bite, however PO was poor during this session, limited to several bits of main meal but with improved thoroughness with applesauce and buttermilk. Pt with decreased positional awareness, required physical assist with repositioning 3 times for better comfort to attend to meal.      Therapy Documentation Precautions:  Precautions Precautions: Fall Precaution Comments: pusher, left hemiparesis with decreased motor planning Restrictions Weight Bearing Restrictions: No  Pain: Pain Assessment Pain Assessment: 0-10 Pain Score: 8  Pain Type: Chronic pain Pain Location: Generalized Pain Radiating Towards: all over Pain Descriptors / Indicators: Aching Pain Frequency: Constant Patients Stated Pain Goal: 3 Pain Intervention(s): Medication (See eMAR)  See FIM for current functional status  Therapy/Group: Group Therapy  Gabriel [name] 08/20/2013, 12:30 PM

## 2013-08-20 NOTE — Progress Notes (Signed)
Speech Language Pathology Daily Session Note  Patient Details  Name: Cleston Lautner MRN: [id number] Date of Birth: 06/10/1941  Today's Date: 08/20/2013 Time: 1130-1145 Time Calculation (min): 15 min   Skilled Therapeutic Interventions: Dining group with focus on dysphagia/cognition:  Limited PO consumption; pt noted to spontaneously clean residue from left side of mouth.  Min cues to attend to left plate.  Tolerated solids and nectar-thick liquids with no overt s/s aspiration and min overall cues for swallow safety.       Therapy/Group: Group Therapy  Blenda Mounts Laurice 08/20/2013, 3:26 PM

## 2013-08-20 NOTE — Progress Notes (Addendum)
Palliative Medicine Team Consult Note   Met with family and patient to discuss goals of care. His son called during our meeting and wants to be present for full discussion. I laid preliminary groundwork with family and will meet them at 12 noon on Sunday to further explore options.   Mr. Gabriel [name] understand that he has lung cancer but did not have good insight into what that means for him moving forward-while I did not give him a survival time frame or estimated prognosis I did tell him that we could not cure him which he really hadn't heard or processed before my meeting with him. Even though his chemo/rads was palliative he didn't understand that he would not be cured. We were then able to talk about where and how he wanted to spend the time that he has left and he handled this discussion very well. He was very clear that he wanted to spend his time with his wife and they are very anxious about SNF vs. Home issues. I also introduced the concept of hospice which I will do in more detail on Sunday.  Mr, Gabriel [name] c/o of a considerable amount of pain- attributable to his bone mets most likley. I do not have his records from [place] and he does not know the name of his oncologist, I did review Dr. Darrall Dears notes and they are looking into whether or not he has positive markers for oral lung ca meds. He has severe thrombocytopenia from chemo and moderate anemia that has been followed. His renal function was normal on his last labs.  Recommendations:  1. DNR, discussed in detail and confirmed 2. He would like to pursue non-invasive and possible oral chemo option for palliative purposes to control his lung cancer-he will need to see his oncologist soon. 3. For his bony pain I would recommend protocol for cancer bone pain that would include bisphosphonate, decadron and NSAID in addition to opiates. Will go ahead and order these adjuvant pain medications-they may be opiate sparing and his renal function is  normal. 4. Will meet with full family Sundat at 12 noon.  Anderson Malta, DO Palliative Medicine

## 2013-08-20 NOTE — Progress Notes (Signed)
Occupationall Therapy Note  Patient Details  Name: Gabriel [name] MRN: [id number] Date of Birth: 03-03-1941 Today's Date: 08/20/2013 Time: 4098-1191  (45 min) Pain: back  (no number given; nursing aware Individual session   Addressed bed mobility, sitting balance, sit to stand, RUE strength, crossing midline, attending to left.  Pt lying supine upon OT arrival.  Was total assist supine to EOB.   Sat EOB for BADL session with max cues for postural control, upright posture.  Pt. Leaning to left and total assist for balance.  Went from sit to stand with total assist with plus 2.  Pt. Complained of back hurting during session when sitting. Pt. Pushing with RUE but better than last week.   He helped don shirt with HOH assist for initiation, and reaching.  Transferred to wc with total assist plus 2.  Pt. positioned in wc with lap tray and pillows and place at nursing station for supervision.     Humberto Seals 08/20/2013, 12:58 PM

## 2013-08-20 NOTE — Progress Notes (Signed)
Physical Therapy Note  Patient Details  Name: Gabriel [name] MRN: [id number] Date of Birth: 31-Aug-1941 Today's Date: 08/20/2013  1000-1030 ( 30 minutes) individual Pain: 5/10 back pain/ pt using pain patch Focus of treatment: Therapeutic activities focused on facilitation of static sitting balance Treatment: Pt at nurses station in wc with max lean to left; transfers scoot to right or left max assist +1; Static sitting - pt unable to correct left lean without max assist; sitting performing reaching activities to right with bedside table support ; sitting with therapy ball on lap with trunk flexion/extension and max tactile cues to maintain midline (right shoulder next to therapists left shoulder); returned to nurses station with quick release belt in place.     Retina Bernardy,JIM 08/20/2013, 10:26 AM

## 2013-08-20 NOTE — Plan of Care (Signed)
Problem: RH BOWEL ELIMINATION Goal: RH STG MANAGE BOWEL WITH ASSISTANCE STG Manage Bowel with minimal Assistance.  Outcome: Not Progressing Pt. Has been constipated Goal: RH STG MANAGE BOWEL W/MEDICATION W/ASSISTANCE STG Manage Bowel with Medication with minimal Assistance.  Outcome: Not Progressing Daily sorbitol given prn; no results; continuing to monitor for effectiveness

## 2013-08-20 NOTE — Progress Notes (Signed)
Late entry consult note placed for 10/24. Orders entered for metastatic bone pain including NSAID, Zometa, and Decadron. Will re-meet on Sunday for further goals of care discussion when his son can be present.  Anderson Malta, DO Palliative Medicine

## 2013-08-21 ENCOUNTER — Inpatient Hospital Stay (HOSPITAL_COMMUNITY): Payer: Medicare Other | Admitting: *Deleted

## 2013-08-21 DIAGNOSIS — I214 Non-ST elevation (NSTEMI) myocardial infarction: Secondary | ICD-10-CM

## 2013-08-21 DIAGNOSIS — I6350 Cerebral infarction due to unspecified occlusion or stenosis of unspecified cerebral artery: Secondary | ICD-10-CM

## 2013-08-21 DIAGNOSIS — C412 Malignant neoplasm of vertebral column: Secondary | ICD-10-CM

## 2013-08-21 MED ORDER — LORAZEPAM 0.5 MG PO TABS
0.5000 mg | ORAL_TABLET | Freq: Every day | ORAL | Status: DC
  Administered 2013-08-21 – 2013-08-23 (×3): 0.5 mg via ORAL
  Filled 2013-08-21 (×3): qty 1

## 2013-08-21 MED ORDER — SENNOSIDES-DOCUSATE SODIUM 8.6-50 MG PO TABS
2.0000 | ORAL_TABLET | Freq: Every day | ORAL | Status: DC
  Administered 2013-08-21 – 2013-08-22 (×2): 2 via ORAL
  Filled 2013-08-21 (×2): qty 2

## 2013-08-21 NOTE — Progress Notes (Signed)
1630 Pt. Had a good day and in good spirits.  State's he feels better since having a good night sleep.  According to chart, no BM since 10/19.  Enema offered but patient refused, states he "knows he had BM last night".  RN to continue to monitor for s/s of bowel discomfort and constipation.  No further complaints at this time.

## 2013-08-21 NOTE — Progress Notes (Signed)
Physical Therapy Note  Patient Details  Name: Roney Youtz MRN: [id number] Date of Birth: [birth year] Today's Date: 08/21/2013  1030-1110 (40 minutes) individual Pain : pt reports minimal pain back/ premedicated " I fee better today" Focus of treatment: Therapeutic activities focused on maintaining sitting trunk alignment/ static sitting balance without pushing to left Treatment: Pt in bed upon arrival ; supine to sit assist with bilateral LEs , max assist trunk; max assist to scoot forward to edge of bed; transfer (scoot) max assist +1 to tilt in space wc (to improve sitting alignment); sitting edge of mat with elbows on bedside table and mod tactile cues for weight shifts to right (without pushing to left ) X 10 minutes. Pt able to maintain midline for 1-2 minutes before uncorrected lean to left; returned to nurses station with quick release belt in place.    Cheila Wickstrom,JIM 08/21/2013, 11:12 AM

## 2013-08-21 NOTE — Progress Notes (Signed)
Palliative Medicine Team  I re-met with Mr. Markovitz and his family, including his brother and son to further discuss goals of care. He is doing much better today from a symptom management standpoint.  Summary of Goals:  1. After CIR he plans to go to [facility] for continued rehab efforts-I did education how to get hospice involved in his care if he is no longer under medicare rehab benefit at SNF and if he desires to transition home or his condition deteriorates rapidly.  2. Scheduled his PM Lorazapam since he did so well with this.  3. He will need a repeat dose of Zometa in 2 weeks-include in d/c summary.  4. Continue decadron, NSAID indefinitely  5. I reviewed his oncology records-family is not wanting to pursue any additional chemotherapy or radiation, but depending on his tumor type-he may benefit from Tarceva-I will defer this to oncology-there are very limited oncology resources in [place]-I will attempt to locate his previous provider and biopsy results if possible. At this point I do not think any additional imaging would be beneficial unless there is an interventional opiate sparing option for pain control such as epidural/local block.  Will follow to discharge.  Anderson Malta, DO Palliative Medicine

## 2013-08-21 NOTE — Progress Notes (Signed)
Subjective/Complaints: Oriented to [facility] No back pain today. Review of Systems - Negative except left weakness  Objective: Vital Signs: Blood pressure 124/78, pulse 62, temperature 97.9 F (36.6 C), temperature source Oral, resp. rate 18, height 5\' 10"  (1.778 m), weight 63.095 kg (139 lb 1.6 oz), SpO2 98.00%. Dg Lumbar Spine 2-3 Views  08/19/2013   CLINICAL DATA:  Low back pain after fall, lung cancer with lumbar spine metastases.  EXAM: LUMBAR SPINE - 2-3 VIEW  COMPARISON:  Lumbar MRI 06/22/2013  FINDINGS: Normal alignment of the lumbar vertebral bodies. There is multiple levels of endplate osteophytosis. No significant disc space loss. The skeletal metastasis seen on comparison MRI are not as well demonstrated. Severe osteophytosis noted.  IMPRESSION: 1. No acute findings lumbar spine. 2. Severe multilevel osteophytosis. 3. Vertebral body metastasis are not as well demonstrated as on comparison MRI.   Electronically Signed   By: Genevive Bi M.D.   On: 08/19/2013 10:52   No results found for this or any previous visit (from the past 72 hour(s)).   HEENT: normal Cardio: RRR and no murmur Resp: CTA B/L and unlabored GI: BS positive and non distended Extremity:  Pulses positive and No Edema Skin:   Intact Neuro: Alert/Oriented, Cranial Nerve Abnormalities Left central 7, Normal Sensory, Abnormal Motor 0/5 Left delt , bi, tri, grip, 0 in Left HF, KE, ADF, Abnormal FMC Ataxic/ dec FMC and Dysarthric, severe L neglect Musc/Skel:  No pain to palp over neck adn upper back, no deformity Gen NAD   Assessment/Plan: 1. Functional deficits secondary to Left hemiparesis R thrombotic periopercular CVA which require 3+ hours per day of interdisciplinary therapy in a comprehensive inpatient rehab setting. Physiatrist is providing close team supervision and 24 hour management of active medical problems listed below. Physiatrist and rehab team continue to assess barriers to discharge/monitor patient  progress toward functional and medical goals.  Stable for transfer to SNF                      FIM - Bathing Bathing Steps Patient Completed: Chest;Abdomen Bathing: 1: Total-Patient completes 0-2 of 10 parts or less than 25%  FIM - Upper Body Dressing/Undressing Upper body dressing/undressing steps patient completed: Pull shirt over trunk Upper body dressing/undressing: 2: Max-Patient completed 25-49% of tasks FIM - Lower Body Dressing/Undressing Lower body dressing/undressing steps patient completed: Thread/unthread right underwear leg Lower body dressing/undressing: 1: Two helpers  FIM - Toileting Toileting: 0: Activity did not occur  FIM - Diplomatic Services operational officer Devices: Grab bars Toilet Transfers: 0-Activity did not occur  FIM - Banker Devices: HOB elevated Bed/Chair Transfer: 1: Supine > Sit: Total A (helper does all/Pt. < 25%);1: Bed > Chair or W/C: Total A (helper does all/Pt. < 25%);1: Two helpers  FIM - Locomotion: Wheelchair Distance: 50' Locomotion: Wheelchair: 0: Activity did not occur FIM - Locomotion: Ambulation Locomotion: Ambulation Assistive Devices:  (handrail in hallway) Ambulation/Gait Assistance: 1: +2 Total assist Locomotion: Ambulation: 0: Activity did not occur  Comprehension Comprehension Mode: Auditory Comprehension: 5-Understands basic 90% of the time/requires cueing < 10% of the time  Expression Expression Mode: Verbal Expression: 5-Expresses basic needs/ideas: With extra time/assistive device  Social Interaction Social Interaction: 4-Interacts appropriately 75 - 89% of the time - Needs redirection for appropriate language or to initiate interaction.  Problem Solving Problem Solving: 3-Solves basic 50 - 74% of the time/requires cueing 25 - 49% of the time  Memory Memory: 2-Recognizes or recalls  25 - 49% of the time/requires cueing 51 - 75% of the time  Medical Problem List and  Plan:  1. Thrombotic right periopercular region, subinsular region and lenticular nucleus and right caudate infarct with extension to involve most of R MCA distribution and ACA and increased left hemiparesis, left neglect-  2. DVT Prophylaxis/Anticoagulation: SCDs. Monitor for any signs of DVT  3. Pain Management/chronic back pain related to metastatic lung cancer: Duragesic patch increased to home dose of but now is more susceptible to SE given large CVA  norco 10/325 for breakthrough pain as well  Palliative care started Decadron,zometa and nonsteroidal yesterday. This appears to be helping. Small increased risk of stroke on nonsteroidal 4. Neuropsych: This patient is capable of making decisions on his own behalf.  5. Dysphagia. Dysphagia 2 nectar thick liquids. Monitor for any signs of aspiration. Followup speech therapy  6. Non-ST elevated myocardial infarction. off aspirin therapy. Followup cardiology services as needed. Low-dose beta blocker has been added  7. Thrombocytopenia. Aspirin has been held  Followup CBC. Hematology services continues to follow, transuse plt if count <10K 8. Metastatic lung cancer. Followup oncology services Dr. Clelia Croft and radiation oncology Dr. Kathrynn Running. Patient received his first radiation treatment 02/14/2013 in addition to 3 cycle of chemotherapy last given 08/04/2013  9. Hyperlipidemia. Lipitor  10.  Urinary retention I/O cath, add Flomax 12.  Anemia, check stool guaic, transfused 2 Units PRBCs f/u Hgb improved 13. End of life issues/palliative care. Discussed the case with oncology. Prognosis is guarded. Expect less than 6 month life expectancy. Palliative consult appreciated  LOS (Days) 11 A FACE TO FACE EVALUATION WAS PERFORMED  KIRSTEINS,ANDREW E 08/21/2013, 10:34 AM

## 2013-08-21 NOTE — Progress Notes (Signed)
Lethargic early part of shift. Confused during the night. Difficult  to reorient, continued to think he was at home. Yelling out for family at times. Refused enema, may need to increase PO laxative. Scabbed areas noted to right inner thigh and right peri area. Redness noted around meatus. Foley patent with amber urine, decreased urine output. Very poor appetite. Gabriel [name] A

## 2013-08-22 ENCOUNTER — Inpatient Hospital Stay (HOSPITAL_COMMUNITY): Payer: Medicare Other | Admitting: Rehabilitation

## 2013-08-22 ENCOUNTER — Inpatient Hospital Stay (HOSPITAL_COMMUNITY): Payer: Medicare Other | Admitting: Occupational Therapy

## 2013-08-22 ENCOUNTER — Inpatient Hospital Stay (HOSPITAL_COMMUNITY): Payer: Medicare Other | Admitting: Speech Pathology

## 2013-08-22 LAB — URINALYSIS, ROUTINE W REFLEX MICROSCOPIC
Bilirubin Urine: NEGATIVE
Hgb urine dipstick: NEGATIVE
Leukocytes, UA: NEGATIVE
Nitrite: NEGATIVE
Protein, ur: NEGATIVE mg/dL
Specific Gravity, Urine: 1.025 (ref 1.005–1.030)
Urobilinogen, UA: 1 mg/dL (ref 0.0–1.0)

## 2013-08-22 MED ORDER — LIDOCAINE HCL 2 % EX GEL: CUTANEOUS | Status: DC | PRN

## 2013-08-22 NOTE — Plan of Care (Signed)
Problem: RH BLADDER ELIMINATION Goal: RH STG MANAGE BLADDER WITH ASSISTANCE STG Manage Bladder With Minimal Assistance  Outcome: Not Progressing Has foley Goal: RH STG MANAGE BLADDER WITH MEDICATION WITH ASSISTANCE STG Manage Bladder With Medication With minimal Assistance.  Outcome: Not Progressing Has foley Goal: RH STG MANAGE BLADDER WITH EQUIPMENT WITH ASSISTANCE STG Manage Bladder With Equipment With minimal Assistance  Outcome: Not Progressing Has foley Goal: RH OTHER STG BLADDER ELIMINATION GOALS W/ASSIST Other STG Bladder Elimination Goals With minimal Assistance  Outcome: Not Progressing Has foley

## 2013-08-22 NOTE — Progress Notes (Signed)
Foley catheter discontinued at 1858 per order.  Patient tolerate well, will monitor if patient doesn't void within 8 hrs staff will scan the bladder and cath if volume >350.  Redness at the tip of penis due to irritation from the foley catheter, applied lidocaine.  Will continue to monitor patient as POC.

## 2013-08-22 NOTE — Progress Notes (Signed)
Occupational Therapy Session Notes  Patient Details  Name: Gabriel [name] MRN: [id number] Date of Birth: 1941-08-28  Today's Date: 08/22/2013  Short Term Goals: Week 1:  OT Short Term Goal 1 (Week 1): Pt will perform LB bathing sit to stand with mod assist. OT Short Term Goal 1 - Progress (Week 1): Not progressing OT Short Term Goal 2 (Week 1): Pt will donn a pullover shirt with supervision. OT Short Term Goal 2 - Progress (Week 1): Not progressing OT Short Term Goal 3 (Week 1): Pt will maintain dynamic sitting balance with no more than min assist when attempting to donn LB clothing. OT Short Term Goal 3 - Progress (Week 1): Not progressing OT Short Term Goal 4 (Week 1): Pt will perform toilet transfer to handicapped toilet or 3:1 with mod assist. OT Short Term Goal 4 - Progress (Week 1): Not progressing OT Short Term Goal 5 (Week 1): Pt will use the LUE for bathing tasks with mod assist. OT Short Term Goal 5 - Progress (Week 1): Not progressing  Week 2:  OT Short Term Goal 1 (Week 2): Pt will maintain sitting balance statically EOB with max assist in preparation for selfcare tasks.   OT Short Term Goal 2 (Week 2): Pt will initiate and complete UB bathing with no more than min assist and min instructional cueing.   OT Short Term Goal 3 (Week 2): Pt will perform LB bathing with max assist sit to stand.  OT Short Term Goal 4 (Week 2): Pt will position the LUE in his lap with no more than mod instructional cueing before or after a transitional movement.    Skilled Therapeutic Interventions/Progress Updates:   Session #1 [phone] - 55 Minutes Individual Therapy No complaints of pain " I feel good today" Patient found supine in bed. Patient engaged in bed mobility with total assist X2 in order for therapist to perform doffing of old brief, peri care, and donning of new brief. Patient then engaged in UB/LB bathing with total assist X2 and UB/LB dressing with total assist X2. During bathing &  dressing tasks, patient consistently stated "my arm isn't working, I can't do it". During ADL, therapist notified RN of open skin wound on back of right leg and places around left buttock/hip. After ADL retraining at bed level, patient engaged in supine -> sit with total assist X2 and sat edge of bed with total assist. Patient transferred edge of bed -> w/c with total assist X2. Patient then sat at sink for grooming tasks of washing face and shaving; patient able to complete these tasks with set-up assistance. Positioned patient in w/c with pillow under RUE, w/c tilted, and quick release belt donned. Left patient seated in w/c with call bell & phone within reach.   Session #2 [id number] - Co-Treatment with PT 1430-1453 - 23 Minutes No complaints of pain Patient found supine in bed. Co-Treatment with PT focusing on bed mobility, dynamic sitting balance/tolerance/endurance exercises, forward leaning, initiating movements, trunk control/stability exercises, sit<>stands using two person-sheet technique, edge of bed -> w/c squat pivot transfer, and positioning in w/c. Therapist printed subluxation handout to place above patient's bed; noticed sublux in left UE. Educated wife on importance of positioning secondary to sublux. Patient left seated in w/c with LUE positioned on pillow; call bell & phone within reach and wife present at bedside.   Precautions:  Precautions Precautions: Fall Precaution Comments: pusher, left hemiparesis with decreased motor planning Restrictions Weight Bearing Restrictions: No  See FIM for  current functional status  Gabriel [name] 08/22/2013, 7:16 AM

## 2013-08-22 NOTE — Progress Notes (Signed)
Physical Therapy Session Note  Patient Details  Name: Gabriel [name] MRN: [id number] Date of Birth: Jan 04, 1941  Today's Date: 08/22/2013 Time: 1400-1430 Time Calculation (min): 30 min  Short Term Goals: Week 2:  PT Short Term Goal 1 (Week 2): Pt will perform bed mobility with attention to L side at max assist and mod cuing PT Short Term Goal 2 (Week 2): Pt will perform static sitting balance x 3 mins consistently at mod assist with head maintained at midline PT Short Term Goal 3 (Week 2): Pt will perform squat pivot transfer at max assist to L or R side.  PT Short Term Goal 4 (Week 2): Pt will perform sit <> stand at max assist  Skilled Therapeutic Interventions/Progress Updates:   Skilled co-treatment with OT during pm session.  Pt received lying in bed and was initially resistant to getting OOB, however stated that we could perform activity on EOB, therefore pt agreed.  Performed supine > sit at total assist +2 for safety, however pt able to initiate and reach for L handrail with R UE.  Cues for turning head towards L to locate handrail for accurate reaching.  Once sitting at EOB, requires total assist to maintain upright posture, however continues to demonstrate increased posterior pelvic tilt, forward rounded shoulders and forward head.  Provided facilitation at pts back and shoulders to increase shoulder retraction and more extended trunk.  Performed several reaching activities to R and L side, up and lateral, and also forward and down.  Pt shows increased initiation of trunk leans, however requires total to max assist to control movement and prevent further LOB.  OT noted that L shoulder is subluxed, therefore provided support at shoulder/scapula during activity.  Performed 2 reps of standing at +2 assist with use of sheet around pts hips and therapists arms wrapped under B axillas then to sheet for increased support.  Pt able to tolerate approx 30 secs of standing with max cues for increased  trunk/hip/knee extension and also head up (cervical extension with visual cue).  See OT note for further details during session.  Pt left in tilt in space w/c with adjustments made to head rest for increased support and also provided pillow under LUE to decrease shoulder sublux with handout provided by OT to educate wife on proper positioning.    Therapy Documentation Precautions:  Precautions Precautions: Fall Precaution Comments: pusher, left hemiparesis with decreased motor planning Restrictions Weight Bearing Restrictions: No   Vital Signs: Therapy Vitals Temp: 97.4 F (36.3 C) Temp src: Axillary Pulse Rate: 76 Resp: 18 BP: 139/71 mmHg Patient Position, if appropriate: Sitting Oxygen Therapy SpO2: 98 % Pain: Pt with pain in lower back, however agreed to participate in therapy and states that he got Tylenol earlier in afternoon.      See FIM for current functional status  Therapy/Group: Co-Treatment  [name] 08/22/2013, 4:13 PM

## 2013-08-22 NOTE — Progress Notes (Signed)
Speech Language Pathology Daily Session Notes  Patient Details  Name: Gabriel [name] MRN: [id number] Date of Birth: 1940/12/31  Today's Date: 08/22/2013  Session 1 Time: 0800-0840 Time Calculation (min): 40 min  Session 2 Time: 1130-1145 Time Calculation: 15 min  Short Term Goals: Week 1: SLP Short Term Goal 1 (Week 1): Patient will consume Dys.2 textures and thin liquids with Min verbal cues to recall and utilize safe swallow strategies to minimize s/s of aspiration. SLP Short Term Goal 1 - Progress (Week 1): Updated due to goal met SLP Short Term Goal 2 (Week 1): Patient will perform pharyngeal strengthening exercises with Min verbal cues. SLP Short Term Goal 2 - Progress (Week 1): Progressing toward goal SLP Short Term Goal 3 (Week 1): Patient will recall and utilize speech intelligibility strategies during a structured task with Min verbal cues. SLP Short Term Goal 3 - Progress (Week 1): Progressing toward goal SLP Short Term Goal 4 (Week 1): Patient will demonstrate moderately-complex problem solving during functional task with Mod verbal cues. SLP Short Term Goal 4 - Progress (Week 1): Discontinued (comment) (unrealistic given cva extension) SLP Short Term Goal 5 (Week 1): Patient will label 2 physical and 2 cognitive deficits that resulted from CVA with Min question cues. SLP Short Term Goal 5 - Progress (Week 1): Progressing toward goal SLP Short Term Goal 6 (Week 1): Patient will request help as needed with Mod question and verbal cues. SLP Short Term Goal 6 - Progress (Week 1): Met  Skilled Therapeutic Interventions:  Session 1: Treatment focus on dysphagia and cognitive goals. Pt had consumed breakfast before beginning of session and declined further PO trials of textures. SLP facilitated session by providing Max verbal cues for utilization of multiple swallows with nectar-thick coffee but did not demonstrate any overt s/s of aspiration. Pt appeared brighter today and reported  he felt well rested and did not have any pain. Pt required Min verbal cues for utilization of speech intelligibility strategies at the sentence level and was ~90% intelligible. Pt was also emotional throughout the session, especially when speaking about his wife. Continue with current plan.    Session 2: Pt participated in co-treatment with OT in diners club with focus on dysphagia goals and self-feeding. Pt required Mod verbal cues for utilization of small bites/sips, decreased rate of self-feeding and utilization of multiple swallows. Pt with overt cough X 1, suspect due to over stuffing of oral cavity. Pt with limited PO intake but demonstrated increased social interaction and initiated conversation with other group members.  Pt also required Mod verbal cues for increased vocal intensity at the phrase level to increase overall speech intelligibility. Continue with current plan of care.   FIM:  Comprehension Comprehension Mode: Auditory Comprehension: 5-Understands basic 90% of the time/requires cueing < 10% of the time Expression Expression Mode: Verbal Expression: 5-Expresses basic needs/ideas: With extra time/assistive device Social Interaction Social Interaction: 4-Interacts appropriately 75 - 89% of the time - Needs redirection for appropriate language or to initiate interaction. Problem Solving Problem Solving: 3-Solves basic 50 - 74% of the time/requires cueing 25 - 49% of the time Memory Memory: 3-Recognizes or recalls 50 - 74% of the time/requires cueing 25 - 49% of the time FIM - Eating Eating Activity: 5: Set-up assist for open containers;5: Needs verbal cues/supervision  Pain No/Denies Pain  Therapy/Group: Individual Therapy and Group Therapy  Narissa Beaufort 08/22/2013, 4:10 PM

## 2013-08-22 NOTE — Plan of Care (Signed)
Problem: RH Balance Goal: LTG Patient will maintain dynamic standing balance (PT) LTG: Patient will maintain dynamic standing balance with assistance during mobility activities (PT)  Outcome: Not Applicable Date Met:  08/22/13 Pt unable to stand safely at this time without +2 assist, therefore will D/C standing and ambulation goals.    Problem: RH Car Transfers Goal: LTG Patient will perform car transfers with assist (PT) LTG: Patient will perform car transfers with assistance (PT).  Outcome: Not Applicable Date Met:  08/22/13 Pt to D/C to SNF, therefore will D/C car transfer goal.   Problem: RH Ambulation Goal: LTG Patient will ambulate in controlled environment (PT) LTG: Patient will ambulate in a controlled environment, # of feet with assistance (PT).  Outcome: Not Applicable Date Met:  08/22/13 Pt unable to stand safely at this time without +2 assist, therefore will D/C standing and ambulation goals.   Goal: LTG Patient will ambulate in home environment (PT) LTG: Patient will ambulate in home environment, # of feet with assistance (PT).  Outcome: Not Applicable Date Met:  08/22/13 Pt unable to stand safely at this time without +2 assist, therefore will D/C standing and ambulation goals.    Problem: RH Stairs Goal: LTG Patient will ambulate up and down stairs w/assist (PT) LTG: Patient will ambulate up and down # of stairs with assistance (PT)  Outcome: Not Applicable Date Met:  08/22/13 Pt unable to stand safely at this time without +2 assist, therefore will D/C standing and ambulation goals.

## 2013-08-22 NOTE — Progress Notes (Signed)
Subjective/Complaints: Slept well after ativan No back pain today. Review of Systems - Negative except left weakness  Objective: Vital Signs: Blood pressure 124/64, pulse 84, temperature 97.6 F (36.4 C), temperature source Oral, resp. rate 18, height 5\' 10"  (1.778 m), weight 63.7 kg (140 lb 6.9 oz), SpO2 99.00%. No results found. No results found for this or any previous visit (from the past 72 hour(s)).   HEENT: normal Cardio: RRR and no murmur Resp: CTA B/L and unlabored GI: BS positive and non distended Extremity:  Pulses positive and No Edema Skin:   Intact Neuro: Alert/Oriented, Cranial Nerve Abnormalities Left central 7, Normal Sensory, Abnormal Motor 0/5 Left delt , bi, tri, grip, 0 in Left HF, KE, ADF, Abnormal FMC Ataxic/ dec FMC and Dysarthric, severe L neglect Musc/Skel:  No pain to palp over neck adn upper back, no deformity Gen NAD   Assessment/Plan: 1. Functional deficits secondary to Left hemiparesis R thrombotic periopercular CVA which require 3+ hours per day of interdisciplinary therapy in a comprehensive inpatient rehab setting. Physiatrist is providing close team supervision and 24 hour management of active medical problems listed below. Physiatrist and rehab team continue to assess barriers to discharge/monitor patient progress toward functional and medical goals.  Stable for transfer to SNF                      FIM - Bathing Bathing Steps Patient Completed: Chest;Abdomen Bathing: 1: Total-Patient completes 0-2 of 10 parts or less than 25%  FIM - Upper Body Dressing/Undressing Upper body dressing/undressing steps patient completed: Pull shirt over trunk Upper body dressing/undressing: 2: Max-Patient completed 25-49% of tasks FIM - Lower Body Dressing/Undressing Lower body dressing/undressing steps patient completed: Thread/unthread right underwear leg Lower body dressing/undressing: 1: Two helpers  FIM - Toileting Toileting: 0: Activity did not  occur  FIM - Diplomatic Services operational officer Devices: Grab bars Toilet Transfers: 0-Activity did not occur  FIM - Banker Devices: HOB elevated Bed/Chair Transfer: 1: Supine > Sit: Total A (helper does all/Pt. < 25%);1: Bed > Chair or W/C: Total A (helper does all/Pt. < 25%);1: Two helpers  FIM - Locomotion: Wheelchair Distance: 50' Locomotion: Wheelchair: 0: Activity did not occur FIM - Locomotion: Ambulation Locomotion: Ambulation Assistive Devices:  (handrail in hallway) Ambulation/Gait Assistance: 1: +2 Total assist Locomotion: Ambulation: 0: Activity did not occur  Comprehension Comprehension Mode: Auditory Comprehension: 5-Understands basic 90% of the time/requires cueing < 10% of the time  Expression Expression Mode: Verbal Expression: 5-Expresses basic needs/ideas: With extra time/assistive device  Social Interaction Social Interaction: 4-Interacts appropriately 75 - 89% of the time - Needs redirection for appropriate language or to initiate interaction.  Problem Solving Problem Solving: 3-Solves basic 50 - 74% of the time/requires cueing 25 - 49% of the time  Memory Memory: 2-Recognizes or recalls 25 - 49% of the time/requires cueing 51 - 75% of the time  Medical Problem List and Plan:  1. Thrombotic right periopercular region, subinsular region and lenticular nucleus and right caudate infarct with extension to involve most of R MCA distribution and ACA and increased left hemiparesis, left neglect-  2. DVT Prophylaxis/Anticoagulation: SCDs. Monitor for any signs of DVT  3. Pain Management/chronic back pain related to metastatic lung cancer: Duragesic patch increased to home dose of but now is more susceptible to SE given large CVA  norco 10/325 for breakthrough pain as well  Palliative care started Decadron, and nonsteroidal yesterday. Zometa to be  repeated in 2 wks     4. Neuropsych: This patient is  capable of making decisions on his own behalf.  5. Dysphagia. Dysphagia 2 nectar thick liquids. Monitor for any signs of aspiration. Followup speech therapy  6. Non-ST elevated myocardial infarction. off aspirin therapy. Followup cardiology services as needed. Low-dose beta blocker has been added  7. Thrombocytopenia. Aspirin has been held  Followup CBC. Hematology services continues to follow, transuse plt if count <10K 8. Metastatic lung cancer. Followup oncology services Dr. Clelia Croft and radiation oncology Dr. Kathrynn Running. Patient received his first radiation treatment 02/14/2013 in addition to 3 cycle of chemotherapy last given 08/04/2013  9. Hyperlipidemia. Lipitor  10.  Urinary retention I/O cath, add Flomax 12.  Anemia,related to CA not chemo transfused 2 Units PRBCs f/u Hgb improved 13. End of life issues/palliative care. Discussed the case with oncology. Prognosis is guarded. Expect less than 6 month life expectancy. Palliative consult appreciated, cannot get hsopice while undergoing therapy for CVA  LOS (Days) 12 A FACE TO FACE EVALUATION WAS PERFORMED  Gabriel [name] E 08/22/2013, 8:55 AM

## 2013-08-22 NOTE — Progress Notes (Signed)
Social Work Patient ID: Gabriel [name], male   DOB: [birth year], 72 y.o.   MRN: [id number] Met with pt and wife to inform them spoke with [facility] who will have a bed on Wed.  According to MD pt is medically ready for transfer to SNF. Informed wife Gabriel [name] will be contacting her today or tomorrow and the tentative plan.  This is her first choice and she will talk with Gabriel [name].  Work toward discharge.

## 2013-08-22 NOTE — Progress Notes (Signed)
Received phone call from patient's wife re: Foley catheter. Patient has had difficulty with urinary retention-the indwelling foley is difficult for him to accept and he would like another attempt at removing the catheter. I discussed risks benefits of this including the need to replace the catheter.    Remove Foley catheter  Use I/O catheter for urinary retention-monitor output closely-increase Flomax to 0.8 mg  Again supported family and provided education about how to self refer to Hospice after he leaves rehab or transition off rehab benefit to LTC bed.  Anderson Malta, DO Palliative Medicine

## 2013-08-22 NOTE — Progress Notes (Signed)
Foley patent, bleeding at meatus. Leg strap in place. Decreased urine output, amber urine. Poor po intake, ? Add IV fluids. Right porta cath accessed. Scheduled ativan given at HS, awake until 0130. Increased confusion at HS, yelling out at times. Refused enema at bedtime, adamant he had BM Saturday. PRN sorbitol given along with scheduled senna s. Gabriel [name] A

## 2013-08-22 NOTE — Progress Notes (Signed)
Occupational Therapy Session Note  Patient Details  Name: Montrae Braithwaite MRN: [id number] Date of Birth: 15-Jul-1941  Today's Date: 08/22/2013 Time: 4010-2725  Skilled Therapeutic Interventions/Progress Updates:   Diner's Club with SLP. Focus on increasing PO intake, swallowing 2 times with each bite before proceeding, and attention to left,etc. Pt required moderate cuing to maintain swallowing precautions and to slow his pace during this session.   Initial PO intake was sufficient however patient satiated prior to completing meal, with preference for [place] over [place].  Patient demonstrated improved social interaction and requested information from group members, although with lability during this session related to his recent marriage.   Patient consumed a portion of his pudding and requested return to bed due to discomfort.  Therapy Documentation Precautions:  Precautions Precautions: Fall Precaution Comments: pusher, left hemiparesis with decreased motor planning Restrictions Weight Bearing Restrictions: No  Pain: 6/10, back   Therapy/Group: Group Therapy  Collie Wernick 08/22/2013, 3:52 PM

## 2013-08-23 ENCOUNTER — Inpatient Hospital Stay (HOSPITAL_COMMUNITY): Payer: Medicare Other

## 2013-08-23 ENCOUNTER — Inpatient Hospital Stay (HOSPITAL_COMMUNITY): Payer: Medicare Other | Admitting: Speech Pathology

## 2013-08-23 DIAGNOSIS — G8110 Spastic hemiplegia affecting unspecified side: Secondary | ICD-10-CM

## 2013-08-23 DIAGNOSIS — C399 Malignant neoplasm of lower respiratory tract, part unspecified: Secondary | ICD-10-CM

## 2013-08-23 DIAGNOSIS — D6959 Other secondary thrombocytopenia: Secondary | ICD-10-CM

## 2013-08-23 DIAGNOSIS — I6330 Cerebral infarction due to thrombosis of unspecified cerebral artery: Secondary | ICD-10-CM

## 2013-08-23 LAB — URINE CULTURE: Colony Count: NO GROWTH

## 2013-08-23 MED ORDER — DIBUCAINE 1 % RE OINT
TOPICAL_OINTMENT | RECTAL | Status: DC | PRN
  Filled 2013-08-23: qty 28

## 2013-08-23 MED ORDER — TAMSULOSIN HCL 0.4 MG PO CAPS
0.8000 mg | ORAL_CAPSULE | Freq: Every day | ORAL | Status: DC
  Administered 2013-08-23: 0.8 mg via ORAL
  Filled 2013-08-23 (×2): qty 2

## 2013-08-23 MED ORDER — SENNOSIDES-DOCUSATE SODIUM 8.6-50 MG PO TABS
2.0000 | ORAL_TABLET | Freq: Two times a day (BID) | ORAL | Status: DC
  Administered 2013-08-23 – 2013-08-24 (×2): 2 via ORAL
  Filled 2013-08-23 (×2): qty 2

## 2013-08-23 MED ORDER — HYDROCORTISONE ACETATE 25 MG RE SUPP
25.0000 mg | Freq: Two times a day (BID) | RECTAL | Status: DC
  Administered 2013-08-23 – 2013-08-24 (×2): 25 mg via RECTAL
  Filled 2013-08-23 (×4): qty 1

## 2013-08-23 NOTE — Progress Notes (Signed)
Occupational Therapy Session Note  Patient Details  Name: Gabriel [name] MRN: [id number] Date of Birth: Mar 14, 1941  Today's Date: 08/23/2013 Time: 1005-1030 Time Calculation (min): 25 min OT Calculated Time 1005-1100 - 55 Minutes Co-Treatment time with PT  Short Term Goals: Week 1:  OT Short Term Goal 1 (Week 1): Pt will perform LB bathing sit to stand with mod assist. OT Short Term Goal 1 - Progress (Week 1): Not progressing OT Short Term Goal 2 (Week 1): Pt will donn a pullover shirt with supervision. OT Short Term Goal 2 - Progress (Week 1): Not progressing OT Short Term Goal 3 (Week 1): Pt will maintain dynamic sitting balance with no more than min assist when attempting to donn LB clothing. OT Short Term Goal 3 - Progress (Week 1): Not progressing OT Short Term Goal 4 (Week 1): Pt will perform toilet transfer to handicapped toilet or 3:1 with mod assist. OT Short Term Goal 4 - Progress (Week 1): Not progressing OT Short Term Goal 5 (Week 1): Pt will use the LUE for bathing tasks with mod assist. OT Short Term Goal 5 - Progress (Week 1): Not progressing  Week 2:  OT Short Term Goal 1 (Week 2): Pt will maintain sitting balance statically EOB with max assist in preparation for selfcare tasks.   OT Short Term Goal 2 (Week 2): Pt will initiate and complete UB bathing with no more than min assist and min instructional cueing.   OT Short Term Goal 3 (Week 2): Pt will perform LB bathing with max assist sit to stand.  OT Short Term Goal 4 (Week 2): Pt will position the LUE in his lap with no more than mod instructional cueing before or after a transitional movement.    Skilled Therapeutic Interventions/Progress Updates:  No complaints of pain Patient found supine in bed upon entering room. Co-Treatment with physical therapy for entire session. Patient engaged in LB bathing & dressing while supine in bed and HOB raised prn. Patient required mod verbal cues in order to stay on task and for  initiation of most tasks. After LB ADL completed, patient engaged in bed mobility to sit edge of bed; while seated edge of bed focused on dynamic sitting balance/tolerance/endurance and UB bathing & dressing tasks. Patient transferred edge of bed -> w/c; stand pivot technique with total assist X2. Patient requested to use bathroom, patient stood in bathroom with total assist, using grab bar; OT assist with pulling pants off waist. Patient then sat on drop arm BSC ~ 5 minutes with no successful urine output or BM. Patient stood with total assist while OT donned pants -> waist; total assist X2 for transfer <> BSC. PT/OT assisted patient with re-positioning in chair, then patient sat at sink to complete grooming tasks of brushing teeth. At end of session, left patient seated in w/c with call bell & phone within reach.   Precautions:  Precautions Precautions: Fall Precaution Comments: pusher, left hemiparesis with decreased motor planning Restrictions Weight Bearing Restrictions: No  See FIM for current functional status  Therapy/Group: Co-Treatment  Brandilee Pies 08/23/2013, 11:22 AM

## 2013-08-23 NOTE — Progress Notes (Signed)
Occupational Therapy Session Note  Patient Details  Name: Gabriel [name] MRN: [id number] Date of Birth: 1941-01-15  Today's Date: 08/23/2013 Time: 9562-1308 Time Calculation (min): 30 min  Skilled Therapeutic Interventions/Progress Updates: [employer] with SLP. Focus on increasing PO intake, swallowing 2 times with each bite before proceeding, improved socialization and attention to left,. Pt required min verbal cuing to maintain swallowing precautions and to slow his pace during this session, dyphagia III texture, sandwich, peaches, and nectar thin liquid. PO intake improved this session to 40% of sandwich, 80% of peaches.   Patient able to hold brief conversation with both additional members during session.  Patient required max assist to transfer back to bed after group due to complaint of fatigue and mild discomfort at left upper and lower extremities.  OT provided 20 reps of PROM (flexion/extension) to L-U/LE while patient remained supine in bed.    Therapy Documentation Precautions:  Precautions Precautions: Fall Precaution Comments: pusher, left hemiparesis with decreased motor , motor apraxia Restrictions Weight Bearing Restrictions: No  Pain: No pain Pain Assessment Pain Assessment: 0-10 Faces Pain Scale: Hurts even more Pain Type: Acute pain Pain Location: Back Pain Orientation: Lower Pain Descriptors / Indicators: Aching Pain Onset: With Activity Pain Intervention(s): Repositioned See FIM for current functional status  Therapy/Group: Individual Therapy  Geovani Tootle 08/23/2013, 3:54 PM

## 2013-08-23 NOTE — Progress Notes (Signed)
Patient had complaints of flaring hemorrhoids. Talked with Marissa Nestle, PA and orders received to give enema and Anusol sup PR. Fleets enema given at 1945 and assisted patient on the bedpan at 2000. Patient stated he was unable to push in order to defecate. Manual disimpaction was performed with a large amount of stool that resulted. Patient reported relief afterward. Anusol sup inserted due to bleeding of hemorrhoids. Barrier cream applied and repositioned patient on his back in bed. Continue plan of care.

## 2013-08-23 NOTE — Progress Notes (Signed)
Speech Language Pathology Daily Session Note  Patient Details  Name: Gabriel [name] MRN: [id number] Date of Birth: 1941/07/08  Today's Date: 08/23/2013 Time: 1130-1145 Time Calculation (min): 15 min  Short Term Goals: Week 2: SLP Short Term Goal 1 (Week 2): Patient will consume Dys.3 textures and nectar-thick liquids with Min verbal cues to recall and utilize safe swallow strategies to minimize s/s of aspiration. SLP Short Term Goal 2 (Week 2): Patient will perform pharyngeal strengthening exercises with Min verbal cues. SLP Short Term Goal 3 (Week 2): Patient will recall and utilize speech intelligibility strategies during a structured task with Min verbal cues. SLP Short Term Goal 4 (Week 2): Patient will label 2 physical and 2 cognitive deficits that resulted from CVA with Min question cues. SLP Short Term Goal 5 (Week 2): Patient will request help as needed with Min question and verbal cues.  Skilled Therapeutic Interventions: Pt participated in co-treatment with OT in [employer] with focus on dysphagia and self-feeding goals.  Pt consumed upgraded diet of Dys. 3 textures with nectar-thick liquids without overt s/s of aspiration, however, pt required Mod verbal cues for utilization of small bites, slow rate of self-feeding and to clear left pocketing. Pt with increased overall PO intake today but demonstrated decreased social interaction today with other group members.    FIM:  Comprehension Comprehension Mode: Auditory Comprehension: 5-Understands basic 90% of the time/requires cueing < 10% of the time Expression Expression Mode: Verbal Expression: 4-Expresses basic 75 - 89% of the time/requires cueing 10 - 24% of the time. Needs helper to occlude trach/needs to repeat words. Social Interaction Social Interaction: 4-Interacts appropriately 75 - 89% of the time - Needs redirection for appropriate language or to initiate interaction. Problem Solving Problem Solving: 4-Solves basic 75  - 89% of the time/requires cueing 10 - 24% of the time Memory Memory: 3-Recognizes or recalls 50 - 74% of the time/requires cueing 25 - 49% of the time FIM - Eating Eating Activity: 4: Helper checks for pocketed food;5: Needs verbal cues/supervision  Pain Pain Assessment Pain Assessment: 0-10 Pain Score: 0-No pain Faces Pain Scale: Hurts even more Pain Type: Acute pain Pain Location: Back Pain Orientation: Lower Pain Descriptors / Indicators: Aching Pain Onset: With Activity Pain Intervention(s): Repositioned  Therapy/Group: Group Therapy  Lasean Gorniak 08/23/2013, 2:32 PM

## 2013-08-23 NOTE — Progress Notes (Signed)
Occupational Therapy Session Note  Patient Details  Name: Gabriel [name] MRN: [id number] Date of Birth: 06-26-1941  Today's Date: 08/23/2013 Time: 1300 (co-tx with PT 1300-1400)-1330 Time Calculation (min): 30 min  Short Term Goals: Week 2:  OT Short Term Goal 1 (Week 2): Pt will maintain sitting balance statically EOB with max assist in preparation for selfcare tasks.   OT Short Term Goal 2 (Week 2): Pt will initiate and complete UB bathing with no more than min assist and min instructional cueing.   OT Short Term Goal 3 (Week 2): Pt will perform LB bathing with max assist sit to stand.  OT Short Term Goal 4 (Week 2): Pt will position the LUE in his lap with no more than mod instructional cueing before or after a transitional movement.    Skilled Therapeutic Interventions/Progress Updates:    Pt seen for co-treatment with PT this PM. Pt completed supine>sit with total assist and squat pivot transfer bed>w/c with total assist. Engaged in standing tolerance and postural control activity in standing frame. Tolerated approx 5 min on first trial and 2 min second trial. Used mirror as visual feedback for postural control and required manual facilitation for B glute activation. Pt demonstrating B shoulder protraction requiring manual facilitation to correct. Engaged in activities in standing to address L attention requiring mod cues to attend to L. Engaged in reaching with RUE across body to retrieve items then weight shift to R side to place them. Pt required verbal cues and manual assist to lift RUE from standing frame during reaching activity. Engaged in reaching activity from w/c level as well. Pt required manual facilitation to correct postural control in tilt in space w/c as well.   Therapy Documentation Precautions:  Precautions Precautions: Fall Precaution Comments: pusher, left hemiparesis with decreased motor , motor apraxia Restrictions Weight Bearing Restrictions: No General:   Vital  Signs: Therapy Vitals Temp: 97.2 F (36.2 C) Temp src: Oral Pulse Rate: 71 Resp: 18 BP: 123/63 mmHg Patient Position, if appropriate: Sitting Oxygen Therapy SpO2: 100 % Pain: Pain Assessment Pain Assessment: 0-10 Pain Score: 0-No pain Faces Pain Scale: Hurts even more Pain Type: Acute pain Pain Location: Back Pain Orientation: Lower Pain Descriptors / Indicators: Aching Pain Onset: With Activity Pain Intervention(s): Repositioned  See FIM for current functional status  Therapy/Group: Co-Treatment  Marianna Cid N 08/23/2013, 3:03 PM

## 2013-08-23 NOTE — Plan of Care (Signed)
Problem: RH BLADDER ELIMINATION Goal: RH STG MANAGE BLADDER WITH ASSISTANCE STG Manage Bladder With Minimal Assistance  Outcome: Not Progressing PVR/I&O catheter managed by staff

## 2013-08-23 NOTE — Progress Notes (Signed)
Physical Therapy Discharge Summary  Patient Details  Name: Gabriel [name] MRN: [id number] Date of Birth: 1941-07-28  Today's Date: 08/23/2013 Time: 1914-7829 Time Calculation (min): 30 min  Patient has met 5 of 5 long term goals due to improved activity tolerance, improved balance and improved postural control.  Patient to discharge at a wheelchair level Total Assist.  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  Goals were downgraded to total assist.  He is actually able to perform sitting balance at more mod to max assist at times and transfer at max assist at times.  Due to this fact, he will now be D/Cing to SNF for decreased burden of care and to continue rehab.  Feel he may need to transition to LTC if no progress is made and due to poor overall prognosis.   Reasons goals not met: n/a.  Pt continues to require max to total assist for all mobility and is unable to stand without +2 assist for safety due to lack of trunk control, decreased use of LUE/LE and motor apraxia and ataxia noted during movements on R side.    Recommendation:  Patient will benefit from ongoing skilled PT services in skilled nursing facility setting to continue to advance safe functional mobility, address ongoing impairments in decreased sitting balance, decreased use of LUE/LE, decreased awareness and safety, decreased attention to L, decreased postural control, decrease burden of care, and minimize fall risk.  Equipment: No equipment provided  Reasons for discharge: treatment goals met and discharge from hospital  Patient/family agrees with progress made and goals achieved: Yes  PT Treatment/Intervention:  Skilled co-treatment with OT this afternoon.  Pt received in bed, but willing to get OOB and work with therapy for last time due to D/C tomorrow to SNF.  Performing rolling R and L in bed in order to assist with adjusting pants following  urination.  When rolling R, requires total assist due to LUE/LE weakness, therefore assisted at [facility] and also assisted with LLE over RLE.  When rolling L, pt able to initiate by reaching across body to L handrail and pulling himself over to his side at mod/max assist.  Requires facilitation for flexing RLE when rolling.  Once on L side, requires total assist with facilitation at hips and trunk to elevate trunk into sitting.  Pt does well reaching with RUE to foot board in order to sit more upright on side of bed.  Requires mod to max assist to maintain sitting position.  Performed squat pivot transfer to R at max/total assist with facilitation for forward weight shift and forward trunk lean, and also for increased WB through LLE during transfer.  He was able to reach R hand across for R handrail in order to assist with transfer.  Assisted to gym to perform standing frame activity for increased WB through LLE, increased trunk control, increased WB through LUE, and also to decrease pusher posture by reaching to the R and increasing use of RUE to decrease use of RUE on table. Able to tolerate standing x 4-5 mins on first rep, however only tolerated approx 2 mins on second rep with seated rest breaks in between.  Assisted pt out into hallway to have pt use RLE to self propel backwards in w/c.  Provided max assist to steer w/c during activity.  Once back in room, assessed strength, sensation, coordination and proprioception.  See details below.  Left pt tilted in w/c with  pillow supporting LUE and cushion under head to increase L cervical rotation.  See OT note for further details on session.    PT Discharge Precautions/Restrictions Precautions Precautions: Fall Precaution Comments: pusher, left hemiparesis with decreased motor , motor apraxia Restrictions Weight Bearing Restrictions: No Vital Signs Therapy Vitals Temp: 97.2 F (36.2 C) Temp src: Oral Pulse Rate: 71 Resp: 18 BP: 123/63  mmHg Patient Position, if appropriate: Sitting Oxygen Therapy SpO2: 100 % Pain Pain Assessment Pain Assessment: 0-10 Faces Pain Scale: Hurts even more Pain Type: Acute pain Pain Location: Back Pain Orientation: Lower Pain Descriptors / Indicators: Aching Pain Onset: With Activity Pain Intervention(s): Repositioned Vision/Perception     Cognition Overall Cognitive Status: Impaired/Different from baseline Arousal/Alertness: Awake/alert Orientation Level: Oriented X4 Attention: Sustained;Selective Sustained Attention: Appears intact Selective Attention: Appears intact Memory: Impaired Memory Impairment: Retrieval deficit;Decreased recall of new information Awareness: Impaired Awareness Impairment: Anticipatory impairment Safety/Judgment: Impaired Comments: Pt continues to have decreased safety awareness, esp with lack of trunk control during fuctional mobility.  Sensation Sensation Light Touch: Appears Intact Stereognosis: Not tested Hot/Cold: Not tested Proprioception: Impaired Detail Proprioception Impaired Details: Impaired LLE Additional Comments: pt able to answer 3/4 correct during proprioception testing.  Coordination Gross Motor Movements are Fluid and Coordinated: No Fine Motor Movements are Fluid and Coordinated: No Coordination and Movement Description: Due to extension of stroke while on inpatient rehab, pt now demonstrates no active movement in LLE or LUE during tasks.  Heel Shin Test: Pt unable to perform L over R due to hemiplegia in LLE, note some ataxic movement with R over L.  Motor  Motor Motor: Hemiplegia;Abnormal postural alignment and control;Motor apraxia Motor - Discharge Observations: Pt now with severely decreased trunk control during sitting activities (static and dynamic), increased pushing to the L now noted, no active movement in LUE/LE  Mobility Bed Mobility Bed Mobility: Rolling Left;Rolling Right;Left Sidelying to Sit Rolling Right: 1: +1  Total assist Rolling Right Details: Verbal cues for technique;Verbal cues for precautions/safety;Manual facilitation for weight shifting;Manual facilitation for weight bearing;Manual facilitation for placement;Verbal cues for sequencing Rolling Left: 2: Max assist Rolling Left Details: Verbal cues for sequencing;Verbal cues for technique;Verbal cues for precautions/safety;Manual facilitation for weight shifting;Manual facilitation for placement Left Sidelying to Sit: 1: +1 Total assist Left Sidelying to Sit Details: Verbal cues for sequencing;Verbal cues for technique;Verbal cues for precautions/safety;Manual facilitation for weight shifting;Manual facilitation for placement;Manual facilitation for weight bearing Transfers Transfers: Yes Squat Pivot Transfers: 1: +1 Total assist Squat Pivot Transfer Details: Verbal cues for sequencing;Verbal cues for technique;Verbal cues for precautions/safety;Verbal cues for safe use of DME/AE;Manual facilitation for weight shifting;Manual facilitation for placement;Manual facilitation for weight bearing Locomotion  Ambulation Ambulation/Gait Assistance: Not tested (comment) Stairs / Additional Locomotion Stairs: No Wheelchair Mobility Wheelchair Mobility: Yes Wheelchair Assistance: 2: Max Chiropodist Details: Tactile cues for posture;Verbal cues for sequencing;Verbal cues for technique;Verbal cues for Engineer, drilling: Right upper extremity (propelling backwards) Wheelchair Parts Management: Needs assistance Distance: 20'  Trunk/Postural Assessment  Cervical Assessment Cervical Assessment: Exceptions to [facility] Cervical AROM Overall Cervical AROM Comments: Pt continues to keep head rotated to the R.  Thoracic Assessment Thoracic Assessment: Exceptions to [facility] Thoracic Strength Overall Thoracic Strength Comments: Pt demonstrates forward, rounded shoulders during static and dynamic sitting.  Lumbar  Assessment Lumbar Assessment: Exceptions to [facility] Lumbar Strength Overall Lumbar Strength Comments: Pt demonstrates posterior pelvic tilt with lumbar flexion during sitting.  Postural Control Postural Control: Deficits on evaluation Trunk Control: Pt continues to demonstrate severe  posterior pelvic tilt with increased weight bearing and lateral trunk lean to L side. Not able to achieve upright posture without max to mod assist at all times.   Balance Balance Balance Assessed: Yes Static Sitting Balance Static Sitting - Balance Support: Right upper extremity supported;Feet supported Static Sitting - Level of Assistance: 3: Mod assist;2: Max assist;1: +1 Total assist Static Sitting - Comment/# of Minutes: Intermittent levels of assist needed during static sitting balance due to lack of postural control and demonstrates increased left lateral lean and posterior lean.  Dynamic Sitting Balance Dynamic Sitting - Balance Support: Feet supported;Left upper extremity supported Dynamic Sitting - Level of Assistance: 1: +1 Total assist;2: Max assist Extremity Assessment      RLE Assessment RLE Assessment: Exceptions to [facility] RLE Strength RLE Overall Strength: Deficits RLE Overall Strength Comments: hip flex 3/5, knee flex 3+/5, knee ext 4/5, ankle DF 4/5, PF 4/5 LLE Assessment LLE Assessment: Exceptions to [facility] LLE Strength LLE Overall Strength: Deficits LLE Overall Strength Comments: no active movement noted during attempted testing  See FIM for current functional status  [name] 08/23/2013, 3:56 PM

## 2013-08-23 NOTE — Progress Notes (Signed)
Physical Therapy Note  Patient Details  Name: Gabriel [name] MRN: [id number] Date of Birth: 1941-09-22 Today's Date: 08/23/2013  1030-1100 30 min co-treat with OT for skilled bathing/dressing, transfers  Treatment focused on neuromuscular re-education via demo, manual and tactile cues for L trunk activation and  sequencing for bridging, rolling R, and static and dynamic sitting EOB for bathing and dressing.  When sitting EOB, pt did not push to L if cues to lean toward therapist sitting on his R.  Sitting balance required intermittent min> mod assist, but maintained midline orientation for majority of time spent sitting supported, EOb.  Stand pivot transfer to R bed> w/c, and also w/c>< toilet to R then L, +2 assist.    Pt able to assist with repositioning hips in w/c with tactile and VCS.  Quick release belt applied, and needs left within reach at end of session.  Fareed Fung 08/23/2013, 7:52 AM

## 2013-08-23 NOTE — Discharge Summary (Signed)
Gabriel [name], ENGELSTAD NO.:  [id number]  MEDICAL RECORD NO.:  [id number]  LOCATION:  [id number]                        FACILITY:  [facility]  PHYSICIAN:  Erick Colace, M.D.DATE OF BIRTH:  1970/12/11  DATE OF ADMISSION:  08/10/2013 DATE OF DISCHARGE:  08/22/2013                              DISCHARGE SUMMARY   DISCHARGE DIAGNOSES: 1. Thrombotic right peri-opercular region, cerebrovascular accident. 2. Sequential compression devices for deep venous thrombosis     prophylaxis. 3. Metastatic lung cancer, dysphagia. 4. Non-ST elevated myocardial infarction, thrombocytopenia,     hyperlipidemia.  HISTORY OF PRESENT ILLNESS:  This is a 72 year old right-handed male with history of hypertension, metastatic lung cancer has been receiving chemotherapy, radiation therapy.  The patient used a cane and walker prior to admission.  Admitted to [facility] on August 06, 2013, with left-sided weakness and facial droop.  Initial cranial CT scan showed no acute abnormalities.  TPA was administered.  Findings of elevated troponin and normal CK-MB as well as no changes on EKG.  The patient was transferred to [facility] for ongoing evaluation. MRI of the brain showed moderate-size acute nonhemorrhagic infarct right peri-opercular region, right subinsular region as well as the lenticular nucleus and caudate.  MRA of the head with atherosclerotic type changes. 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  Cardiology Services followup for elevated troponin suspect non ST-elevated myocardial infarction with echocardiogram showing ejection fraction of 60% and grade 1 diastolic dysfunction without emboli.  The patient was placed on aspirin for CVA prophylaxis as well as cardiac standpoint.  Consultation made with Hematology, August 09, 2013, in regard to thrombocytopenia 26,000 decreased to 19,000, advised to hold on anti-platelet agents, thus aspirin was held.   Recommendations had been made to transfuse if platelets less than 10,000 or less and not to initiate platelet agents until counts are 50,000 or higher.  It was anticipated recovery of platelet count it takes several weeks.  The patient was maintained on a dysphagia 2 nectar thick liquid diet.  Physical and occupational therapy ongoing.  The patient was admitted for comprehensive rehab program.  PAST MEDICAL HISTORY:  See discharge diagnoses.  SOCIAL HISTORY:  Lives with spouse.  FUNCTIONAL HISTORY:  Prior to admission, used a cane and walker.  FUNCTIONAL STATUS:  Upon admission to rehab services was max assist to scoot to the edge of the bed.  PHYSICAL EXAMINATION:  VITAL SIGNS:  Blood pressure 136/78, pulse 81, temperature 98, respirations 18. GENERAL:  This was an alert male, oriented x3. HEENT:  Speech was dysarthric, left facial droop.  Pupils reactive to light. LUNGS:  Clear to auscultation. CARDIAC:  Regular rate and rhythm. ABDOMEN:  Soft, nontender.  Good bowel sounds.  The patient with right gaze preference.  REHABILITATION HOSPITAL COURSE:  The patient was admitted to inpatient rehab services with therapies initiated on a 3-hour daily basis consisting of physical therapy, occupational therapy, and rehabilitation nursing.  The following issues were addressed during the patient's rehabilitation stay.  Pertaining to Mr. [name]'s thrombotic right CVA was noted extension to involve most of right MCA distribution and ACA increased left hemiparesis and neglect after followup scanning on August 15, 2013, and  monitored per Neurology Services.  The patient remained off platelet agents secondary to thrombocytopenia, advised not to resume until platelets greater than 50,000.  Palliative Care was involved in regard to patient's metastatic lung cancer, with followup per Oncology Services as advised.  The patient with chronic back pain, with the addition of Zometa and Decadron with  good results.  He was maintained on a dysphagia 2, nectar thick liquid diet and close monitoring of aspiration.  Noted followup Cardiology Services for non ST elevated myocardial infarction.  No chest pain or shortness of breath noted.  Patient remained on low-dose beta-blocker.  As patient's rehab therapy continued, palliative care involved in regard to metastatic lung cancer.  The patient with slow progressive gains, needing max total assist for ongoing mobility. It was felt that skilled nursing facility was needed with bed becoming available, August 24, 2013.  DISCHARGE MEDICATIONS: 1. Lipitor 80 mg p.o. daily. 2. Os-Cal 1 tablet p.o. daily. 3. Celebrex 100 mg p.o. b.i.d. 4. Decadron 4 mg p.o. every 12 hours. 5. Voltaren gel applied to affected areas t.i.d. 6. Pepcid 20 mg p.o. b.i.d. 7. Duragesic patch 75 mcg change every 72 hours. 8. Flonase 50 mcg, 1 spray each nostril daily. 9. Claritin 10 mg p.o. daily. 10.Lopressor 12.5 mg p.o. b.i.d. 11.Multivitamin 1 tablet p.o. daily. 12.Oxycodone immediate release 10 mg p.o. every 4 hours as needed for     severe pain. 13.Protonix 40 mg p.o. at bedtime. 14.Flomax 0.4 mg p.o. daily. 15.Timoptic ophthalmic solution 0.5% one drop both eyes daily. 16.Vitamin C 500 mg p.o. daily.  DIET:  Dysphagia 2 nectar thick liquids.  SPECIAL INSTRUCTIONS:  The patient should follow up Oncology Services as advised.  Continue to monitor platelet counts with no anticoagulation until platelet counts greater than 50,000.  The patient to follow up Dr. Claudette Laws at the outpatient rehab center as needed.     Gabriel [name], P.A.   ______________________________ Erick Colace, M.D.    DA/MEDQ  D:  08/23/2013  T:  08/23/2013  Job:  [id number]  cc:   Erick Colace, M.D. Benjiman Core, M.D. Edsel Petrin, D.O.

## 2013-08-23 NOTE — Progress Notes (Signed)
The skilled treatment note has been reviewed and SLP is in agreement. Tiffine Henigan, M.A., CCC-SLP [phone]  

## 2013-08-23 NOTE — Progress Notes (Signed)
Subjective/Complaints: Slept well after ativan No back pain today. Urinary retention post foley removal Review of Systems - Negative except left weakness  Objective: Vital Signs: Blood pressure 121/58, pulse 72, temperature 97.8 F (36.6 C), temperature source Oral, resp. rate 18, height 5\' 10"  (1.778 m), weight 63.7 kg (140 lb 6.9 oz), SpO2 98.00%. No results found. Results for orders placed during the hospital encounter of 08/10/13 (from the past 72 hour(s))  URINALYSIS, ROUTINE W REFLEX MICROSCOPIC     Status: None   Collection Time    08/22/13  6:44 PM      Result Value Range   Color, Urine YELLOW  YELLOW   APPearance CLEAR  CLEAR   Specific Gravity, Urine 1.025  1.005 - 1.030   pH 6.0  5.0 - 8.0   Glucose, UA NEGATIVE  NEGATIVE mg/dL   Hgb urine dipstick NEGATIVE  NEGATIVE   Bilirubin Urine NEGATIVE  NEGATIVE   Ketones, ur NEGATIVE  NEGATIVE mg/dL   Protein, ur NEGATIVE  NEGATIVE mg/dL   Urobilinogen, UA 1.0  0.0 - 1.0 mg/dL   Nitrite NEGATIVE  NEGATIVE   Leukocytes, UA NEGATIVE  NEGATIVE   Comment: MICROSCOPIC NOT DONE ON URINES WITH NEGATIVE PROTEIN, BLOOD, LEUKOCYTES, NITRITE, OR GLUCOSE <1000 mg/dL.     HEENT: normal Cardio: RRR and no murmur Resp: CTA B/L and unlabored GI: BS positive and non distended Extremity:  Pulses positive and No Edema Skin:   Intact Neuro: Alert/Oriented, Cranial Nerve Abnormalities Left central 7, Normal Sensory, Abnormal Motor 0/5 Left delt , bi, tri, grip, 0 in Left HF, KE, ADF, Abnormal FMC Ataxic/ dec FMC and Dysarthric, severe L neglect Musc/Skel:  No pain to palp over neck adn upper back, no deformity Gen NAD   Assessment/Plan: 1. Functional deficits secondary to Left hemiparesis R thrombotic periopercular CVA which require 3+ hours per day of interdisciplinary therapy in a comprehensive inpatient rehab setting. Physiatrist is providing close team supervision and 24 hour management of active medical problems listed  below. Physiatrist and rehab team continue to assess barriers to discharge/monitor patient progress toward functional and medical goals.  Stable for transfer to SNF  , reportedly has bed at [facility] in am                    FIM - Bathing Bathing Steps Patient Completed: Chest;Abdomen Bathing: 1: Two helpers (2 helpers for bed mobility)  FIM - Upper Body Dressing/Undressing Upper body dressing/undressing steps patient completed: Pull shirt over trunk Upper body dressing/undressing: 1: Two helpers FIM - Lower Body Dressing/Undressing Lower body dressing/undressing steps patient completed: Thread/unthread right underwear leg Lower body dressing/undressing: 1: Two helpers  FIM - Toileting Toileting: 0: Activity did not occur  FIM - Diplomatic Services operational officer Devices: Grab bars Toilet Transfers: 0-Activity did not occur  FIM - Banker Devices: Arm rests;Bed rails Bed/Chair Transfer: 1: Two helpers  FIM - Locomotion: Wheelchair Distance: 50' Locomotion: Wheelchair: 0: Activity did not occur FIM - Locomotion: Ambulation Locomotion: Ambulation Assistive Devices:  (handrail in hallway) Ambulation/Gait Assistance: 1: +2 Total assist Locomotion: Ambulation: 0: Activity did not occur  Comprehension Comprehension Mode: Auditory Comprehension: 5-Understands basic 90% of the time/requires cueing < 10% of the time  Expression Expression Mode: Verbal Expression: 4-Expresses basic 75 - 89% of the time/requires cueing 10 - 24% of the time. Needs helper to occlude trach/needs to repeat words.  Social Interaction Social Interaction: 5-Interacts appropriately 90% of the time - Needs  monitoring or encouragement for participation or interaction.  Problem Solving Problem Solving: 3-Solves basic 50 - 74% of the time/requires cueing 25 - 49% of the time  Memory Memory: 3-Recognizes or recalls 50 - 74% of the time/requires cueing 25 -  49% of the time  Medical Problem List and Plan:  1. Thrombotic right periopercular region, subinsular region and lenticular nucleus and right caudate infarct with extension to involve most of R MCA distribution and ACA and increased left hemiparesis, left neglect-  2. DVT Prophylaxis/Anticoagulation: SCDs. Monitor for any signs of DVT  3. Pain Management/chronic back pain related to metastatic lung cancer: Duragesic patch increased to home dose of but now is more susceptible to SE given large CVA  norco 10/325 for breakthrough pain as well  Palliative care started Decadron, and nonsteroidal yesterday. Zometa to be repeated in 2 wks     4. Neuropsych: This patient is capable of making decisions on his own behalf.  5. Dysphagia. Dysphagia 2 nectar thick liquids. Monitor for any signs of aspiration. Followup speech therapy  6. Non-ST elevated myocardial infarction. off aspirin therapy. Followup cardiology services as needed. Low-dose beta blocker has been added  7. Thrombocytopenia. Aspirin has been held  Followup CBC. Hematology services continues to follow, transuse plt if count <10K 8. Metastatic lung cancer. Followup oncology services Dr. Clelia Croft and radiation oncology Dr. Kathrynn Running. Patient received his first radiation treatment 02/14/2013 in addition to 3 cycle of chemotherapy last given 08/04/2013  9. Hyperlipidemia. Lipitor  10.  Urinary retention I/O cath, add Flomax, monitor orthostasis 12.  Anemia,related to CA not chemo transfused 2 Units PRBCs f/u Hgb improved 13. End of life issues/palliative care. Discussed the case with oncology. Prognosis is guarded. Expect less than 6 month life expectancy. Palliative consult appreciated, cannot get hsopice while undergoing therapy for CVA  LOS (Days) 13 A FACE TO FACE EVALUATION WAS PERFORMED  Gabriel [name] E 08/23/2013, 8:34 AM

## 2013-08-23 NOTE — Progress Notes (Signed)
NUTRITION FOLLOW-UP  DOCUMENTATION CODES Per approved criteria  -Severe malnutrition in the context of chronic illness   INTERVENTION: Continue Ensure Complete po BID, each supplement provides 350 kcal and 13 grams of protein. Please thicken to appropriate consistency. Please provide yogurt or pudding with medications. RD to continue to follow nutrition care plan.  NUTRITION DIAGNOSIS: Increased nutrient needs related to catabolic illness as evidenced by estimated needs. Ongoing.  Goal: Intake to meet >90% of estimated nutrition needs. Unmet.  Monitor:  weight trends, lab trends, I/O's, PO intake, supplement tolerance, diet rx per SLP  ASSESSMENT: PMHx significant for HTN, chronic back pain, metastatic lung CA and ongoing chemo/radiation therapy. Admitted to acute hospital on 10/11 with acute nonhemorrhagic infarct. Admitted to rehab on 10/15.  MBSS completed 10/12 with recommendations for Dysphagia 2 diet with Nectar-Thickened Liquids. Remains on this diet presently. Eating poor, consuming bites to 25% of meals.  Wife and daughter requesting d/c to [facility]. Pt seen by Palliative Care. Family does not want to pursue an additional chemo or radiation. SNF will have a bed available tomorrow, plan to d/c tomorrow.  Upgraded to Dysphagia 3 diet today; remains on Nectar-Thickened liquids. Continues with orders for Ensure Complete PO BID.  Pt meets criteria for severe MALNUTRITION in the context of chronic illness as evidenced by severe fat and muscle mass loss.  Height: Ht Readings from Last 1 Encounters:  08/10/13 5\' 10"  (1.778 m)    Weight: Wt Readings from Last 1 Encounters:  08/22/13 140 lb 6.9 oz (63.7 kg)  Admit wt 149 lb - wt trending down   BMI:  Body mass index is 20.15 kg/(m^2). WNL  Estimated Nutritional Needs: Kcal: 2000 - 2300 Protein: 87 - 100 g  Fluid:  approx 2 liters daily  Skin: intact  Diet Order: Dysphagia 3; Nectar Liquids  EDUCATION NEEDS: -No  education needs identified at this time   Intake/Output Summary (Last 24 hours) at 08/23/13 1407 Last data filed at 08/23/13 1308  Gross per 24 hour  Intake      0 ml  Output    675 ml  Net   -675 ml    Last BM: 10/14   Labs:  No results found for this basename: NA, K, CL, CO2, BUN, CREATININE, CALCIUM, MG, PHOS, GLUCOSE,  in the last 168 hours  CBG (last 3)  No results found for this basename: GLUCAP,  in the last 72 hours  Scheduled Meds: . atorvastatin  80 mg Oral q1800  . calcium-vitamin D  1 tablet Oral Q breakfast  . celecoxib  100 mg Oral BID  . dexamethasone  4 mg Oral Q12H  . diclofenac sodium  2 g Topical TID WC  . famotidine  20 mg Oral BID  . feeding supplement (ENSURE COMPLETE)  237 mL Oral BID BM  . fentaNYL  75 mcg Transdermal Q72H  . fluticasone  1 spray Each Nare Daily  . loratadine  10 mg Oral Daily  . LORazepam  0.5 mg Oral QHS  . metoprolol tartrate  12.5 mg Oral BID  . multivitamin with minerals  1 tablet Oral Daily  . pantoprazole  40 mg Oral QHS  . senna-docusate  2 tablet Oral QHS  . tamsulosin  0.4 mg Oral QPC supper  . timolol  1 drop Both Eyes Daily  . vitamin C  500 mg Oral Daily    Continuous Infusions:  none  Jarold Motto MS, RD, LDN Pager: [phone] After-hours pager: [phone]

## 2013-08-23 NOTE — Progress Notes (Signed)
Speech Language Pathology Daily Session Note  Patient Details  Name: Gabriel [name] MRN: [id number] Date of Birth: May 31, 1941  Today's Date: 08/23/2013 Time: 7829-5621 Time Calculation (min): 45 min  Short Term Goals: Week 2: SLP Short Term Goal 1 (Week 2): Patient will consume Dys.3 textures and nectar-thick liquids with Min verbal cues to recall and utilize safe swallow strategies to minimize s/s of aspiration. SLP Short Term Goal 2 (Week 2): Patient will perform pharyngeal strengthening exercises with Min verbal cues. SLP Short Term Goal 3 (Week 2): Patient will recall and utilize speech intelligibility strategies during a structured task with Min verbal cues. SLP Short Term Goal 4 (Week 2): Patient will label 2 physical and 2 cognitive deficits that resulted from CVA with Min question cues. SLP Short Term Goal 5 (Week 2): Patient will request help as needed with Min question and verbal cues.  Skilled Therapeutic Interventions: Skilled treatment session focused on dysphagia and cognition goals.  SLP facilitated session with trial upgrade of Dys.3 textures; patient required Mod verbal cues to utilize safe swallow strategies, i.e. two swallows per bite/sip, but did not demonstrate overt s/s of aspiration.  SLP upgraded diet to a Dys.3 texture and nectar-thick liquid with full supervision to ensure utilization of strategies.  During meal, patient requested help and demonstrated basic problem solving skills with Min questioning cues.  SLP also facilitated session with discussion regarding patient's deficits since his stroke; patient required Min questioning cues to name two physical and two cognitive deficits. Throughout the session, patient required Min verbal and visual cues to utilize intelligibility strategies at the sentence level.  Given patient's expansion of lesion and functional change last week in addition to his discharge to a SNF, goals were modified accordingly.     FIM:   Comprehension Comprehension Mode: Auditory Comprehension: 5-Understands basic 90% of the time/requires cueing < 10% of the time Expression Expression Mode: Verbal Expression: 4-Expresses basic 75 - 89% of the time/requires cueing 10 - 24% of the time. Needs helper to occlude trach/needs to repeat words. Social Interaction Social Interaction: 5-Interacts appropriately 90% of the time - Needs monitoring or encouragement for participation or interaction. Problem Solving Problem Solving: 4-Solves basic 75 - 89% of the time/requires cueing 10 - 24% of the time Memory Memory: 3-Recognizes or recalls 50 - 74% of the time/requires cueing 25 - 49% of the time FIM - Eating Eating Activity: 4: Helper checks for pocketed food;5: Needs verbal cues/supervision  Pain Pain Assessment Pain Assessment: No/denies pain Pain Score: 0-No pain  Therapy/Group: Individual Therapy  Levora Angel 08/23/2013, 11:57 AM

## 2013-08-23 NOTE — Discharge Summary (Signed)
Discharge summary job # [id number]

## 2013-08-24 NOTE — Progress Notes (Signed)
Subjective/Complaints: Slept well after ativan No back pain today. Urinary retention post foley removal, tolerates in and out cath No breathing issues Review of Systems - Negative except left weakness  Objective: Vital Signs: Blood pressure 126/66, pulse 76, temperature 98.3 F (36.8 C), temperature source Oral, resp. rate 18, height 5\' 10"  (1.778 m), weight 63.7 kg (140 lb 6.9 oz), SpO2 98.00%. No results found. Results for orders placed during the hospital encounter of 08/10/13 (from the past 72 hour(s))  URINALYSIS, ROUTINE W REFLEX MICROSCOPIC     Status: None   Collection Time    08/22/13  6:44 PM      Result Value Range   Color, Urine YELLOW  YELLOW   APPearance CLEAR  CLEAR   Specific Gravity, Urine 1.025  1.005 - 1.030   pH 6.0  5.0 - 8.0   Glucose, UA NEGATIVE  NEGATIVE mg/dL   Hgb urine dipstick NEGATIVE  NEGATIVE   Bilirubin Urine NEGATIVE  NEGATIVE   Ketones, ur NEGATIVE  NEGATIVE mg/dL   Protein, ur NEGATIVE  NEGATIVE mg/dL   Urobilinogen, UA 1.0  0.0 - 1.0 mg/dL   Nitrite NEGATIVE  NEGATIVE   Leukocytes, UA NEGATIVE  NEGATIVE   Comment: MICROSCOPIC NOT DONE ON URINES WITH NEGATIVE PROTEIN, BLOOD, LEUKOCYTES, NITRITE, OR GLUCOSE <1000 mg/dL.  URINE CULTURE     Status: None   Collection Time    08/22/13  6:44 PM      Result Value Range   Specimen Description URINE, CLEAN CATCH     Special Requests NONE     Culture  Setup Time       Value: 08/22/2013 22:06     Performed at [facility] Count       Value: NO GROWTH     Performed at [facility]   Culture       Value: NO GROWTH     Performed at [facility]   Report Status 08/23/2013 FINAL       HEENT: normal Cardio: RRR and no murmur Resp: CTA B/L and unlabored GI: BS positive and non distended Extremity:  Pulses positive and No Edema Skin:   Intact Neuro: Alert/Oriented, Cranial Nerve Abnormalities Left central 7, Normal Sensory, Abnormal Motor 0/5 Left delt , bi, tri,  grip, 0 in Left HF, KE, ADF, Abnormal FMC Ataxic/ dec FMC and Dysarthric, severe L neglect Musc/Skel:   Gen NAD   Assessment/Plan: 1. Functional deficits secondary to Left hemiparesis R thrombotic periopercular CVA which require 3+ hours per day of interdisciplinary therapy in a comprehensive inpatient rehab setting. Physiatrist is providing close team supervision and 24 hour management of active medical problems listed below. Physiatrist and rehab team continue to assess barriers to discharge/monitor patient progress toward functional and medical goals.   reportedly has bed at [facility] today Stable for D/C today Followup hematology/oncology F/u PM&R 3 weeks See D/C summary See D/C instructions                 FIM - Bathing Bathing Steps Patient Completed: Chest;Left Arm;Abdomen;Left upper leg;Right upper leg;Right lower leg (including foot) Bathing: 3: Mod-Patient completes 5-7 47f 10 parts or 50-74%  FIM - Upper Body Dressing/Undressing Upper body dressing/undressing steps patient completed: Pull shirt over trunk Upper body dressing/undressing: 1: Two helpers (seated edge of bed) FIM - Lower Body Dressing/Undressing Lower body dressing/undressing steps patient completed: Thread/unthread right underwear leg Lower body dressing/undressing: 1: Two helpers (for bed mobility)  FIM -  Toileting Toileting: 1: Two helpers  FIM - Diplomatic Services operational officer Devices: Therapist, music Transfers: 1-Two helpers  FIM - Banker Devices: Arm rests;Bed rails Bed/Chair Transfer: 1: Two helpers  FIM - Locomotion: Wheelchair Distance: 20' Locomotion: Wheelchair: 0: Activity did not occur FIM - Locomotion: Ambulation Locomotion: Ambulation Assistive Devices:  (handrail in hallway) Ambulation/Gait Assistance: Not tested (comment) Locomotion: Ambulation: 0: Activity did not occur  Comprehension Comprehension Mode:  Auditory Comprehension: 5-Understands basic 90% of the time/requires cueing < 10% of the time  Expression Expression Mode: Verbal Expression: 4-Expresses basic 75 - 89% of the time/requires cueing 10 - 24% of the time. Needs helper to occlude trach/needs to repeat words.  Social Interaction Social Interaction: 5-Interacts appropriately 90% of the time - Needs monitoring or encouragement for participation or interaction.  Problem Solving Problem Solving: 3-Solves basic 50 - 74% of the time/requires cueing 25 - 49% of the time  Memory Memory: 3-Recognizes or recalls 50 - 74% of the time/requires cueing 25 - 49% of the time  Medical Problem List and Plan:  1. Thrombotic right periopercular region, subinsular region and lenticular nucleus and right caudate infarct with extension to involve most of R MCA distribution and ACA and increased left hemiparesis, left neglect-  2. DVT Prophylaxis/Anticoagulation: SCDs. Monitor for any signs of DVT  3. Pain Management/chronic back pain related to metastatic lung cancer: Duragesic patch increased to home dose of but now is more susceptible to SE given large CVA  norco 10/325 for breakthrough pain as well  Palliative care started Decadron, and nonsteroidal yesterday. Zometa to be repeated in 2 wks     4. Neuropsych: This patient is capable of making decisions on his own behalf.  5. Dysphagia. Dysphagia 2 nectar thick liquids. Monitor for any signs of aspiration. Followup speech therapy  6. Non-ST elevated myocardial infarction. off aspirin therapy. Followup cardiology services as needed. Low-dose beta blocker has been added  7. Thrombocytopenia. Aspirin has been held  Followup CBC. Heme f/u as outpt8. Metastatic lung cancer. Followup oncology services Dr. Clelia Croft and radiation oncology Dr. Kathrynn Running. Patient received his first radiation treatment 02/14/2013 in addition to 3 cycle of chemotherapy last given 08/04/2013  9. Hyperlipidemia. Lipitor   10.  Urinary retention I/O cath, add Flomax, monitor orthostasis 12.  Anemia,related to CA not chemo transfused 2 Units PRBCs f/u Hgb improved 13. End of life issues/palliative care. Discussed the case with oncology. Prognosis is guarded. Expect less than 6 month life expectancy. Palliative consult appreciated, cannot get hsopice while undergoing therapy for CVA  LOS (Days) 14 A FACE TO FACE EVALUATION WAS PERFORMED  KIRSTEINS,ANDREW E 08/24/2013, 9:51 AM

## 2013-08-24 NOTE — Progress Notes (Signed)
Social Work Discharge Note Discharge Note  The overall goal for the admission was met for:   Discharge location: Yes-[facility]  Length of Stay: Yes-14 DAYS  Discharge activity level: Yes-TOTAL ASSIST  Home/community participation: Yes  Services provided included: MD, RD, PT, OT, SLP, RN, Pharmacy and SW  Financial Services: Private Insurance: [facility]  Follow-up services arranged: Other: NHP  Comments (or additional information):AFTER EXTENSION OF CVA WIFE NOT ABLE TO PROVIDE THE CARE HE REQUIRES  Patient/Family verbalized understanding of follow-up arrangements: Yes  Individual responsible for coordination of the follow-up plan: MARIE-WIFE  Confirmed correct DME delivered: Lucy Chris 08/24/2013    Lucy Chris

## 2013-08-24 NOTE — Progress Notes (Signed)
The discharge summary note has been reviewed and SLP is in agreement. Cambreigh Dearing, M.A., CCC-SLP [phone]  

## 2013-08-24 NOTE — Progress Notes (Signed)
Speech Language Pathology Discharge Summary  Patient Details  Name: [name] MRN: [id number] Date of Birth: 1941/08/14  Today's Date: 08/24/2013  Patient has met 5 of 5 long term goals.  Patient to discharge at overall Min;Mod level.  Reasons goals not met: N/A   Clinical Impression/Discharge Summary: Patient met 5 out of 5 long term goals.  Patient made gains from a Dys.2 textures with nectar-thick liquids to toleration of current diet of Dys.3 textures and participation in the water protocol with help with set up and Mod cueing to utilize safe swallow strategies.  It should be noted, that patient fatigue may interfere with his ability to consume Dys.3 textures and monitoring his alertness/fatigue during meals is essential to the patient's overall safety while consuming liquid or textures.  Patient also increased speech intelligibility and can utilize strategies with Min verbal cueing at the sentence level.  Cognitively, patient has increased his basic problem solving and his awareness of his deficits, both with Min questioning and verbal cueing.  Patient education completed with handouts and teach-back opportunities and patient is ready for discharge to SNF. It is recommended that patient continue to receive services to facilitate carryover of compensatory strategies, especially during meals, and to maximize safety and reduce burden of care.   Care Partner:  Type of Caregiver Assistance: Cognitive  Recommendation:  Skilled Nursing facility;24 hour supervision/assistance  Rationale for SLP Follow Up: Maximize swallowing safety   Equipment: N/A   Reasons for discharge: Discharged from hospital   Patient/Family Agrees with Progress Made and Goals Achieved: Yes   See FIM for current functional status  Gabriel [name] 08/24/2013, 11:52 AM

## 2013-08-24 NOTE — Progress Notes (Signed)
Orthopedic Tech Progress Note Patient Details:  Gabriel [name] [birth year] [id number]  Ortho Devices Type of Ortho Device: Ankle Air splint Ortho Device/Splint Location: lle Ortho Device/Splint Interventions: Application   [name] 08/24/2013, 12:41 PM

## 2013-08-24 NOTE — Plan of Care (Signed)
Problem: RH BOWEL ELIMINATION Goal: RH STG MANAGE BOWEL WITH ASSISTANCE STG Manage Bowel with minimal Assistance.  Outcome: Not Progressing Required enema and manual disimpaction to have BM, total assist

## 2013-08-24 NOTE — Progress Notes (Signed)
Patient discharged to [facility] via ambulance. Belongings with patient.

## 2013-08-24 NOTE — Progress Notes (Signed)
Occupational Therapy Discharge Summary  Patient Details  Name: Gabriel [name] MRN: [id number] Date of Birth: 30-May-1941  Today's Date: 08/24/2013  Patient has met 6 of 10 long term goals due to improved activity tolerance, improved balance, postural control, ability to compensate for deficits, functional use of  RIGHT upper extremity, improved attention, improved awareness and improved coordination.  Patient to discharge at overall Total Assist - Total Assist X2 level for safety.  Patient's care partner requires assistance to provide the necessary physical assistance at discharge. Patient discharging to SNF for additional rehab. Patient made slow progress on CIR. His dynamic sitting balance improved over the past couple of days and was able to perform UB bathing & dressing seated edge of bed (total assist X2 to complete this task safely).   Reasons goals not met: Patient did not meet dynamic standing balance goal of total assist, patient currently requires +2 to stand and maintain dynamic standing balance. Patient did not meet UB & LB dressing goals, patient requires total-total assist X2 for UB dressing and total assist X2 for LB dressing secondary to +2 help needed during bed mobility. Patient did not meet toileting goal of total assist, to effectively complete toileting tasks patient requires total assist X2.   Recommendation:  Patient will benefit from ongoing skilled OT services in skilled nursing facility setting to continue to advance functional skills in the area of BADL and Reduce care partner burden.  Equipment: No equipment provided, defer to next venue as necessary  Reasons for discharge: change in medical status, lack of progress toward goals and discharge from hospital  Patient/family agrees with progress made and goals achieved: Yes  Precautions/Restrictions  Precautions Precautions: Fall Precaution Comments: pusher, left hemiparesis with decreased motor , motor  apraxia Restrictions Weight Bearing Restrictions: No  ADL - See FIM  Vision/Perception  Vision - History Baseline Vision: Wears glasses all the time Visual History: Glaucoma Patient Visual Report: No change from baseline Vision - Assessment Eye Alignment: Within Functional Limits Additional Comments: vision impaired, patient with difficult time tracking to right side Perception Perception: Impaired Spatial Orientation: Pt with increased pushing to the left in standing. Praxis Praxis: Impaired Praxis Impairment Details: Motor planning;Initiation Praxis-Other Comments: decreased overall coordination with left hand/UE   Cognition Overall Cognitive Status: Impaired/Different from baseline Arousal/Alertness: Awake/alert Orientation Level: Oriented X4 Attention: Sustained;Selective Sustained Attention: Appears intact Sustained Attention Impairment: Functional basic Selective Attention: Appears intact Selective Attention Impairment: Functional basic Memory: Impaired Memory Impairment: Retrieval deficit;Decreased recall of new information Awareness: Impaired Awareness Impairment: Anticipatory impairment Problem Solving: Impaired Problem Solving Impairment: Functional basic Behaviors: Restless;Impulsive Safety/Judgment: Impaired Comments: Pt continues to have decreased safety awareness, esp with lack of trunk control during fuctional mobility.   Sensation Sensation Light Touch: Appears Intact Stereognosis: Not tested Stereognosis Impaired Details: Impaired LUE Hot/Cold: Not tested Proprioception: Impaired Detail Proprioception Impaired Details: Impaired LLE Additional Comments: pt able to answer 3/4 correct during proprioception testing.  Coordination Gross Motor Movements are Fluid and Coordinated: No Fine Motor Movements are Fluid and Coordinated: No Coordination and Movement Description: Due to extension of stroke while on inpatient rehab, pt now demonstrates no active  movement in LLE or LUE during tasks.  Heel Shin Test: Pt unable to perform L over R due to hemiplegia in LLE, note some ataxic movement with R over L.   Motor  Motor Motor: Hemiplegia;Abnormal postural alignment and control;Motor apraxia Motor - Discharge Observations: Pt now with severely decreased trunk control during sitting activities (static and dynamic), increased  pushing to the L now noted, no active movement in LUE/LE  Mobility  Bed Mobility Bed Mobility: Rolling Left;Rolling Right;Left Sidelying to Sit Rolling Right: 1: +1 Total assist Rolling Right Details: Verbal cues for technique;Verbal cues for precautions/safety;Manual facilitation for weight shifting;Manual facilitation for weight bearing;Manual facilitation for placement;Verbal cues for sequencing Rolling Left: 2: Max assist Rolling Left Details: Verbal cues for sequencing;Verbal cues for technique;Verbal cues for precautions/safety;Manual facilitation for weight shifting;Manual facilitation for placement Left Sidelying to Sit: 1: +1 Total assist Left Sidelying to Sit Details: Verbal cues for sequencing;Verbal cues for technique;Verbal cues for precautions/safety;Manual facilitation for weight shifting;Manual facilitation for placement;Manual facilitation for weight bearing Sit to Sidelying Right: 4: Min assist   Trunk/Postural Assessment  Cervical Assessment Cervical Assessment: Exceptions to [facility] Cervical AROM Overall Cervical AROM Comments: Pt continues to keep head rotated to the R.  Thoracic Assessment Thoracic Assessment: Exceptions to [facility] Thoracic Strength Overall Thoracic Strength Comments: Pt demonstrates forward, rounded shoulders during static and dynamic sitting.  Lumbar Assessment Lumbar Assessment: Exceptions to [facility] Lumbar Strength Overall Lumbar Strength Comments: Pt demonstrates posterior pelvic tilt with lumbar flexion during sitting.  Postural Control Postural Control: Deficits on evaluation Trunk  Control: Pt continues to demonstrate severe posterior pelvic tilt with increased weight bearing and lateral trunk lean to L side. Not able to achieve upright posture without max to mod assist at all times.    Balance Balance Balance Assessed: Yes Static Sitting Balance Static Sitting - Balance Support: Right upper extremity supported;Feet supported Static Sitting - Level of Assistance: 3: Mod assist;2: Max assist;1: +1 Total assist Dynamic Sitting Balance Dynamic Sitting - Balance Support: Feet supported;Left upper extremity supported Dynamic Sitting - Level of Assistance: 1: +1 Total assist;2: Max assist  Extremity/Trunk Assessment RUE Assessment RUE Assessment: Within Functional Limits LUE Assessment LUE Assessment: Exceptions to [facility] LUE Strength LUE Overall Strength Comments: Patient with flacid LUE, no movement in left shoulder/scapula. PROM is [facility]  See FIM for current functional status  Madalaine Portier 08/24/2013, 10:05 AM

## 2013-08-24 NOTE — Progress Notes (Signed)
Social Work Patient ID: Gabriel [name], male   DOB: [birth year], 72 y.o.   MRN: [id number] [facility] and they can take pt at 1;00.  Have contacted wife to let her know. Will arrange ambulance for 1;00 to transport pt to [facility].

## 2013-09-12 ENCOUNTER — Other Ambulatory Visit: Payer: Self-pay

## 2013-09-12 ENCOUNTER — Emergency Department (HOSPITAL_COMMUNITY): Payer: Medicare Other

## 2013-09-12 ENCOUNTER — Encounter (HOSPITAL_COMMUNITY): Payer: Medicare Other | Admitting: Anesthesiology

## 2013-09-12 ENCOUNTER — Encounter (HOSPITAL_COMMUNITY): Payer: Self-pay | Admitting: Emergency Medicine

## 2013-09-12 ENCOUNTER — Emergency Department (HOSPITAL_COMMUNITY): Payer: Medicare Other | Admitting: Anesthesiology

## 2013-09-12 ENCOUNTER — Inpatient Hospital Stay (HOSPITAL_COMMUNITY)
Admission: EM | Admit: 2013-09-12 | Discharge: 2013-09-26 | DRG: 252 | Disposition: E | Payer: Medicare Other | Attending: Vascular Surgery | Admitting: Vascular Surgery

## 2013-09-12 ENCOUNTER — Encounter (HOSPITAL_COMMUNITY): Admission: EM | Disposition: E | Payer: Self-pay | Source: Home / Self Care | Attending: Vascular Surgery

## 2013-09-12 DIAGNOSIS — I4949 Other premature depolarization: Secondary | ICD-10-CM | POA: Diagnosis present

## 2013-09-12 DIAGNOSIS — I469 Cardiac arrest, cause unspecified: Secondary | ICD-10-CM | POA: Diagnosis present

## 2013-09-12 DIAGNOSIS — H409 Unspecified glaucoma: Secondary | ICD-10-CM | POA: Diagnosis present

## 2013-09-12 DIAGNOSIS — C787 Secondary malignant neoplasm of liver and intrahepatic bile duct: Secondary | ICD-10-CM | POA: Diagnosis present

## 2013-09-12 DIAGNOSIS — I639 Cerebral infarction, unspecified: Secondary | ICD-10-CM

## 2013-09-12 DIAGNOSIS — Z87891 Personal history of nicotine dependence: Secondary | ICD-10-CM

## 2013-09-12 DIAGNOSIS — I824Z9 Acute embolism and thrombosis of unspecified deep veins of unspecified distal lower extremity: Secondary | ICD-10-CM | POA: Diagnosis present

## 2013-09-12 DIAGNOSIS — R209 Unspecified disturbances of skin sensation: Secondary | ICD-10-CM | POA: Diagnosis present

## 2013-09-12 DIAGNOSIS — I2510 Atherosclerotic heart disease of native coronary artery without angina pectoris: Secondary | ICD-10-CM | POA: Diagnosis present

## 2013-09-12 DIAGNOSIS — I10 Essential (primary) hypertension: Secondary | ICD-10-CM | POA: Diagnosis present

## 2013-09-12 DIAGNOSIS — I824Y9 Acute embolism and thrombosis of unspecified deep veins of unspecified proximal lower extremity: Principal | ICD-10-CM | POA: Diagnosis present

## 2013-09-12 DIAGNOSIS — Y849 Medical procedure, unspecified as the cause of abnormal reaction of the patient, or of later complication, without mention of misadventure at the time of the procedure: Secondary | ICD-10-CM | POA: Diagnosis not present

## 2013-09-12 DIAGNOSIS — I4901 Ventricular fibrillation: Secondary | ICD-10-CM | POA: Diagnosis not present

## 2013-09-12 DIAGNOSIS — D696 Thrombocytopenia, unspecified: Secondary | ICD-10-CM

## 2013-09-12 DIAGNOSIS — Z66 Do not resuscitate: Secondary | ICD-10-CM | POA: Diagnosis present

## 2013-09-12 DIAGNOSIS — I743 Embolism and thrombosis of arteries of the lower extremities: Secondary | ICD-10-CM

## 2013-09-12 DIAGNOSIS — I249 Acute ischemic heart disease, unspecified: Secondary | ICD-10-CM

## 2013-09-12 DIAGNOSIS — R29898 Other symptoms and signs involving the musculoskeletal system: Secondary | ICD-10-CM | POA: Diagnosis present

## 2013-09-12 DIAGNOSIS — R918 Other nonspecific abnormal finding of lung field: Secondary | ICD-10-CM

## 2013-09-12 DIAGNOSIS — G8918 Other acute postprocedural pain: Secondary | ICD-10-CM | POA: Diagnosis not present

## 2013-09-12 DIAGNOSIS — C761 Malignant neoplasm of thorax: Secondary | ICD-10-CM | POA: Diagnosis present

## 2013-09-12 DIAGNOSIS — I69998 Other sequelae following unspecified cerebrovascular disease: Secondary | ICD-10-CM

## 2013-09-12 DIAGNOSIS — D72829 Elevated white blood cell count, unspecified: Secondary | ICD-10-CM | POA: Diagnosis not present

## 2013-09-12 DIAGNOSIS — I214 Non-ST elevation (NSTEMI) myocardial infarction: Secondary | ICD-10-CM | POA: Diagnosis not present

## 2013-09-12 DIAGNOSIS — D62 Acute posthemorrhagic anemia: Secondary | ICD-10-CM | POA: Diagnosis not present

## 2013-09-12 DIAGNOSIS — R570 Cardiogenic shock: Secondary | ICD-10-CM

## 2013-09-12 DIAGNOSIS — C3490 Malignant neoplasm of unspecified part of unspecified bronchus or lung: Secondary | ICD-10-CM | POA: Diagnosis present

## 2013-09-12 DIAGNOSIS — Z515 Encounter for palliative care: Secondary | ICD-10-CM

## 2013-09-12 DIAGNOSIS — I519 Heart disease, unspecified: Secondary | ICD-10-CM | POA: Diagnosis not present

## 2013-09-12 DIAGNOSIS — I252 Old myocardial infarction: Secondary | ICD-10-CM

## 2013-09-12 DIAGNOSIS — G9340 Encephalopathy, unspecified: Secondary | ICD-10-CM | POA: Diagnosis not present

## 2013-09-12 HISTORY — PX: EMBOLECTOMY: SHX44

## 2013-09-12 HISTORY — PX: PATCH ANGIOPLASTY: SHX6230

## 2013-09-12 LAB — PROTIME-INR
INR: 1.26 (ref 0.00–1.49)
Prothrombin Time: 15.5 seconds — ABNORMAL HIGH (ref 11.6–15.2)

## 2013-09-12 SURGERY — EMBOLECTOMY
Anesthesia: General | Site: Leg Upper | Laterality: Right | Wound class: Clean

## 2013-09-12 MED ORDER — ALBUMIN HUMAN 5 % IV SOLN
INTRAVENOUS | Status: DC | PRN
  Administered 2013-09-12: 23:00:00 via INTRAVENOUS

## 2013-09-12 MED ORDER — ONDANSETRON HCL 4 MG/2ML IJ SOLN
INTRAMUSCULAR | Status: DC | PRN
  Administered 2013-09-12: 4 mg via INTRAVENOUS

## 2013-09-12 MED ORDER — 0.9 % SODIUM CHLORIDE (POUR BTL) OPTIME
TOPICAL | Status: DC | PRN
  Administered 2013-09-12: 2000 mL

## 2013-09-12 MED ORDER — CEFUROXIME SODIUM 1.5 G IJ SOLR
1.5000 g | INTRAMUSCULAR | Status: DC
  Filled 2013-09-12: qty 1.5

## 2013-09-12 MED ORDER — FENTANYL CITRATE 0.05 MG/ML IJ SOLN
INTRAMUSCULAR | Status: DC | PRN
  Administered 2013-09-12 (×2): 50 ug via INTRAVENOUS
  Administered 2013-09-12: 25 ug via INTRAVENOUS

## 2013-09-12 MED ORDER — EPHEDRINE SULFATE 50 MG/ML IJ SOLN
INTRAMUSCULAR | Status: DC | PRN
  Administered 2013-09-12: 10 mg via INTRAVENOUS
  Administered 2013-09-12: 5 mg via INTRAVENOUS

## 2013-09-12 MED ORDER — PROPOFOL 10 MG/ML IV BOLUS
INTRAVENOUS | Status: DC | PRN
  Administered 2013-09-12: 120 mg via INTRAVENOUS
  Administered 2013-09-12: 20 mg via INTRAVENOUS

## 2013-09-12 MED ORDER — HEPARIN SODIUM (PORCINE) 1000 UNIT/ML IJ SOLN
INTRAMUSCULAR | Status: DC | PRN
  Administered 2013-09-12: 10000 [IU] via INTRAVENOUS

## 2013-09-12 MED ORDER — SODIUM CHLORIDE 0.9 % IV SOLN
INTRAVENOUS | Status: DC
  Administered 2013-09-12: 21:00:00 via INTRAVENOUS

## 2013-09-12 MED ORDER — PHENYLEPHRINE HCL 10 MG/ML IJ SOLN
INTRAMUSCULAR | Status: DC | PRN
  Administered 2013-09-12: 120 ug via INTRAVENOUS
  Administered 2013-09-12 (×2): 80 ug via INTRAVENOUS
  Administered 2013-09-12: 40 ug via INTRAVENOUS

## 2013-09-12 MED ORDER — SODIUM CHLORIDE 0.9 % IR SOLN
Status: DC | PRN
  Administered 2013-09-12: 23:00:00

## 2013-09-12 MED ORDER — LACTATED RINGERS IV SOLN
INTRAVENOUS | Status: DC | PRN
  Administered 2013-09-12 (×2): via INTRAVENOUS

## 2013-09-12 MED ORDER — DEXTROSE 5 % IV SOLN
1.5000 g | INTRAVENOUS | Status: AC
  Administered 2013-09-12: 1.5 g via INTRAVENOUS
  Filled 2013-09-12: qty 1.5

## 2013-09-12 MED ORDER — LIDOCAINE HCL (CARDIAC) 20 MG/ML IV SOLN
INTRAVENOUS | Status: DC | PRN
  Administered 2013-09-12: 100 mg via INTRAVENOUS

## 2013-09-12 SURGICAL SUPPLY — 57 items
BANDAGE ESMARK 6X9 LF (GAUZE/BANDAGES/DRESSINGS) IMPLANT
BANDAGE GAUZE ELAST BULKY 4 IN (GAUZE/BANDAGES/DRESSINGS) ×3 IMPLANT
BNDG ESMARK 6X9 LF (GAUZE/BANDAGES/DRESSINGS)
CANISTER SUCTION 2500CC (MISCELLANEOUS) ×3 IMPLANT
CATH EMB 3FR 80CM (CATHETERS) ×3 IMPLANT
CATH EMB 4FR 80CM (CATHETERS) ×3 IMPLANT
CATH EMB 5FR 80CM (CATHETERS) IMPLANT
CLIP TI MEDIUM 24 (CLIP) ×3 IMPLANT
CLIP TI WIDE RED SMALL 24 (CLIP) ×3 IMPLANT
COVER SURGICAL LIGHT HANDLE (MISCELLANEOUS) ×3 IMPLANT
CUFF TOURNIQUET SINGLE 24IN (TOURNIQUET CUFF) IMPLANT
CUFF TOURNIQUET SINGLE 34IN LL (TOURNIQUET CUFF) IMPLANT
CUFF TOURNIQUET SINGLE 44IN (TOURNIQUET CUFF) IMPLANT
DECANTER SPIKE VIAL GLASS SM (MISCELLANEOUS) IMPLANT
DERMABOND ADVANCED (GAUZE/BANDAGES/DRESSINGS)
DERMABOND ADVANCED .7 DNX12 (GAUZE/BANDAGES/DRESSINGS) IMPLANT
DRAIN SNY 10X20 3/4 PERF (WOUND CARE) IMPLANT
DRAPE WARM FLUID 44X44 (DRAPE) ×3 IMPLANT
DRAPE X-RAY CASS 24X20 (DRAPES) IMPLANT
DRSG COVADERM 4X6 (GAUZE/BANDAGES/DRESSINGS) ×3 IMPLANT
DRSG COVADERM 4X8 (GAUZE/BANDAGES/DRESSINGS) IMPLANT
ELECT REM PT RETURN 9FT ADLT (ELECTROSURGICAL) ×3
ELECTRODE REM PT RTRN 9FT ADLT (ELECTROSURGICAL) ×2 IMPLANT
EVACUATOR SILICONE 100CC (DRAIN) IMPLANT
GLOVE BIO SURGEON STRL SZ7.5 (GLOVE) ×3 IMPLANT
GLOVE BIOGEL PI IND STRL 7.0 (GLOVE) ×6 IMPLANT
GLOVE BIOGEL PI INDICATOR 7.0 (GLOVE) ×3
GLOVE SS BIOGEL STRL SZ 6.5 (GLOVE) ×2 IMPLANT
GLOVE SUPERSENSE BIOGEL SZ 6.5 (GLOVE) ×1
GLOVE SURG SS PI 7.5 STRL IVOR (GLOVE) ×6 IMPLANT
GOWN PREVENTION PLUS XLARGE (GOWN DISPOSABLE) ×3 IMPLANT
GOWN STRL NON-REIN LRG LVL3 (GOWN DISPOSABLE) ×12 IMPLANT
KIT BASIN OR (CUSTOM PROCEDURE TRAY) ×3 IMPLANT
KIT ROOM TURNOVER OR (KITS) ×3 IMPLANT
NS IRRIG 1000ML POUR BTL (IV SOLUTION) ×6 IMPLANT
PACK PERIPHERAL VASCULAR (CUSTOM PROCEDURE TRAY) ×3 IMPLANT
PAD ARMBOARD 7.5X6 YLW CONV (MISCELLANEOUS) ×9 IMPLANT
PADDING CAST COTTON 6X4 STRL (CAST SUPPLIES) IMPLANT
PATCH VASCULAR VASCU GUARD 1X6 (Vascular Products) ×3 IMPLANT
SET COLLECT BLD 21X3/4 12 (NEEDLE) IMPLANT
SPONGE GAUZE 4X4 12PLY (GAUZE/BANDAGES/DRESSINGS) ×3 IMPLANT
SPONGE SURGIFOAM ABS GEL 100 (HEMOSTASIS) IMPLANT
STAPLER VISISTAT 35W (STAPLE) ×3 IMPLANT
STOPCOCK 4 WAY LG BORE MALE ST (IV SETS) IMPLANT
SUT PROLENE 5 0 C 1 24 (SUTURE) ×3 IMPLANT
SUT PROLENE 6 0 CC (SUTURE) ×6 IMPLANT
SUT VIC AB 2-0 CTX 36 (SUTURE) ×3 IMPLANT
SUT VIC AB 3-0 SH 27 (SUTURE) ×1
SUT VIC AB 3-0 SH 27X BRD (SUTURE) ×2 IMPLANT
SYR 3ML LL SCALE MARK (SYRINGE) ×6 IMPLANT
SYR TB 1ML LUER SLIP (SYRINGE) ×3 IMPLANT
TOWEL OR 17X24 6PK STRL BLUE (TOWEL DISPOSABLE) ×6 IMPLANT
TOWEL OR 17X26 10 PK STRL BLUE (TOWEL DISPOSABLE) ×6 IMPLANT
TRAY FOLEY CATH 16FRSI W/METER (SET/KITS/TRAYS/PACK) IMPLANT
TUBING EXTENTION W/L.L. (IV SETS) IMPLANT
UNDERPAD 30X30 INCONTINENT (UNDERPADS AND DIAPERS) ×3 IMPLANT
WATER STERILE IRR 1000ML POUR (IV SOLUTION) ×3 IMPLANT

## 2013-09-12 NOTE — Anesthesia Procedure Notes (Signed)
Procedure Name: LMA Insertion Date/Time: 09/24/2013 10:27 PM Performed by: Tillie Viverette S Pre-anesthesia Checklist: Patient identified, Timeout performed, Emergency Drugs available, Suction available and Patient being monitored Patient Re-evaluated:Patient Re-evaluated prior to inductionOxygen Delivery Method: Circle system utilized Preoxygenation: Pre-oxygenation with 100% oxygen Intubation Type: IV induction Ventilation: Mask ventilation without difficulty LMA: LMA inserted LMA Size: 4.0 Number of attempts: 1

## 2013-09-12 NOTE — Anesthesia Preprocedure Evaluation (Signed)
Anesthesia Evaluation  Patient identified by MRN, date of birth, ID band Patient confused    Reviewed: Allergy & Precautions, H&P , NPO status , Patient's Chart, lab work & pertinent test results  Airway Mallampati: II TM Distance: >3 FB Neck ROM: Full    Dental   Pulmonary shortness of breath, former smoker,  Met lung ca breath sounds clear to auscultation+ rhonchi   + wheezing      Cardiovascular hypertension, + Past MI Rhythm:Regular Rate:Tachycardia     Neuro/Psych CVA    GI/Hepatic   Endo/Other    Renal/GU      Musculoskeletal   Abdominal   Peds  Hematology  (+) anemia , thrombocytopenic   Anesthesia Other Findings   Reproductive/Obstetrics                           Anesthesia Physical Anesthesia Plan  ASA: IV and emergent  Anesthesia Plan: General   Post-op Pain Management:    Induction: Intravenous  Airway Management Planned: LMA  Additional Equipment:   Intra-op Plan:   Post-operative Plan: Extubation in OR  Informed Consent: I have reviewed the patients History and Physical, chart, labs and discussed the procedure including the risks, benefits and alternatives for the proposed anesthesia with the patient or authorized representative who has indicated his/her understanding and acceptance.     Plan Discussed with: CRNA and Surgeon  Anesthesia Plan Comments:         Anesthesia Quick Evaluation

## 2013-09-12 NOTE — ED Notes (Addendum)
Pt arrived fm [facility] via care link for admit directly to OR. Pts LLE is pulseless, pale and blue. Dr fields paged . [facility] Vitals: 108/68, [id number], 100%ra, [id number] clear.   Family reports pt is from SNF after stroke 2 wks ago and that Pt has had cold foot on R and that was painful for last 2 weeks.

## 2013-09-12 NOTE — Op Note (Signed)
Procedure: Right popliteal and tibial embolectomy with patch angioplasty of right popliteal artery and tibial peroneal trunk, 4 compartment fasciotomy  Preoperative diagnosis: Acute ischemia right foot  Postoperative diagnosis: Same  Anesthesia: Gen.  Assistant: Della Goo PA-C  Operative findings: #1 embolus and chronic appearing thrombus right leg #2 bovine pericardial patch distal popliteal artery  Operative details: After obtaining informed consent, the patient was taken to the operating room. The patient was placed in supine position on the operating room table. After induction of general anesthesia and endotracheal ablation the patient's entire right lower extremity was prepped and draped in the usual sterile fashion. A longitudinal incision was made on the medial aspect of the right leg below the knee. The incision was carried into the subcutaneous tissues. The greater saphenous vein was small 2 less mm in diameter. The incision was then deepened down the popliteal space by opening the fascia for the full length incision. The popliteal vein and artery were identified. The popliteal artery was dissected free circumferentially.  The anterior tibial artery tibioperoneal trunk were all dissected free circumferentially and vessel loops placed around these. The popliteal artery was dissected free circumferentially and a vessel loop was placed around this as well. There was obvious thrombus within the popliteal artery. The patient was given 10000 units of intravenous heparin.  After appropriate circulation time, the artery was controlled proximally with a vessel loop and distally with vessel loops. A longitudinal opening was made in the distal below-knee popliteal artery and extended down to the takeoff of the tibial vessels. A #4 Fogarty catheter was used to thrombectomize the popliteal artery. Multiple passes were made with a return of a large amount of thrombus. This was all sent to pathology as  a specimen. 2 clean passes were obtained of the popliteal artery there was excellent inflow. There was  embolic/thrombus debris extending down to the tibial vessels.#3  Fogarty catheter was passed down the anterior tibial and tibioperoneal trunk vessels. A large amount of thrombus was removed from the distal anterior tibial artery. There was good backbleeding from this. Several plugs of  debris were also removed from the tibial peroneal trunk. There was good backbleeding from this as well.  The anterior tibial and tibial peroneal trunk were catheterized and multiple passes made until 2 clean passes were obtained. These were thoroughly irrigated with heparinized saline. A bovine pericardial patch was brought in the operative field and sewn on as a patch angioplasty using running 6-0 Prolene suture. Prior to completion of the anastomosis everything was forebled backbled and thoroughly flushed.  The anastomosis was secured, Vesseloops released, and there was good pulsatile flow the proximal portion of each tibial vessel immediately. There was also good Doppler flow at this location. There was also dorsalis pedis and posterior tibial Doppler signals at the foot. The patient had a palpable dorsalis pedis pulse.  Hemostasis was obtained. Next a fasciotomy was performed on the lateral aspect of the right leg to decompress the anterior and lateral compartments. The muscle was viable in both compartments. The anterior compartment was slightly more dusky in the lateral compartment. Both were viable. The posterior superficial and deep compartments had been decompressed through the popliteal artery exposure. A moist saline dressing was applied to the lateral fasciotomy wound. On the medial aspect of the leg the skin was reapproximated with staples leaving the fascia open to keep the muscles decompressed. A dry sterile dressing was placed over this. The patient tolerated the procedure well and there were no  complications.  Instrument, sponge, and needle counts were correct at the end of the case. The patient was taken to the recovery room in stable condition.  Fabienne Bruns, MD [facility] [place] Office: [phone] Pager: [phone]

## 2013-09-12 NOTE — ED Notes (Signed)
Heparin discontinued order recd from [facility] (mid level)

## 2013-09-12 NOTE — ED Notes (Signed)
This RN called OR with critical PTT over 200

## 2013-09-12 NOTE — ED Notes (Signed)
MD at bedside. 

## 2013-09-12 NOTE — ED Notes (Signed)
Brace removed from left foot.  +popliteal pulse on right leg but nothing below leg cool to touch.  Left leg warm to touch with + pulses.  Skin warm and dry and no breakdown noted to back, buttock, heels

## 2013-09-12 NOTE — ED Notes (Signed)
Midlevel Rene Kocher Responded to Dr Darrick Penna Page and is enrout to see patient

## 2013-09-12 NOTE — Consult Note (Signed)
[name] NOTE   MRN : [id number]  Reason for Consult: cold right foot Referring Physician: ED  History of Present Illness: Gabriel [name] is a 72 y.o. male with Hx recent CVA with left sided weakness, metastatic lung CA (chemo and radiation), HTN recent NSTEMI in Oct. Who was at rehab in [place]. Her was taken to [facility] hosp with cold left lower leg. Pt states he has had a cold painful right foot since last thursday.  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 Pt former smoker 2PPD; quit 15 years ago  Current Facility-Administered Medications  Medication Dose Route Frequency Provider Last Rate Last Dose  . 0.9 %  sodium chloride infusion   Intravenous Continuous Gabriel [place], PA-C       Current Outpatient Prescriptions  Medication Sig Dispense Refill  . ALPRAZolam (XANAX) 1 MG tablet Take 1 tablet by mouth 3 (three) times daily as needed for anxiety.       Marland Kitchen amLODipine (NORVASC) 10 MG tablet Take 1 tablet (10 mg total) by mouth daily.  30 tablet  3  . dexamethasone (DECADRON) 2 MG tablet Take 2 mg by mouth 2 (two) times daily with a meal.      . docusate sodium 100 MG CAPS Take 100 mg by mouth 2 (two) times daily as needed for constipation.  60 capsule  3  . fentaNYL (DURAGESIC - DOSED MCG/HR) 25 MCG/HR patch Place 1 patch onto the skin every 3 (three) days.      . Flaxseed, Linseed, (FLAX SEED OIL PO) Take 1 capsule by mouth daily.      Marland Kitchen ibuprofen (ADVIL,MOTRIN) 200 MG tablet Take 400 mg by mouth every 8 (eight) hours as needed for pain.      Marland Kitchen losartan (COZAAR) 100 MG tablet Take 1 tablet (100 mg total) by mouth daily.  30 tablet  3  . Multiple Vitamin (MULTIVITAMIN WITH MINERALS) TABS Take 1 tablet by mouth daily.  30 tablet  3  . ondansetron (ZOFRAN) 4 MG tablet Take 1  tablet (4 mg total) by mouth every 6 (six) hours as needed for nausea.  45 tablet  0  . timolol (TIMOPTIC) 0.5 % ophthalmic solution Place 1 drop into both eyes every morning.  15 mL  4    Pt meds include: Statin :No Betablocker: No ASA: No Other anticoagulants/antiplatelets: none  Past Medical History  Diagnosis Date  . Cancer   . Hypertension   . Glaucoma   . Shortness of breath     with activity  . Stroke 08/06/2013    tpa given  . NSTEMI (non-ST elevated myocardial infarction) 08/06/2013  . Thrombocytopenia 08/10/2013    Past Surgical History  Procedure Laterality Date  . Cataract extraction, bilateral Bilateral 2009    Social History History  Substance Use Topics  . Smoking status: Former Games developer  . Smokeless tobacco: Not on file  . Alcohol Use: No    Family History Family History  Problem Relation Age of Onset  . Heart failure Mother   . Stroke Father   . Heart disease Brother     Allergies  Allergen Reactions  . Hydrocodone Rash    Patient took norco at home     REVIEW OF SYSTEMS  General: [ ]  Weight loss, [ ]  Fever, [ ]   chills Neurologic: [ ]  Dizziness, [ ]  Blackouts, [ ]  Seizure [x ] Stroke, [ ]  "Mini stroke", [ ]  Slurred speech, [ ]  Temporary blindness; [x ] weakness in arms or legs, [ ]  Hoarseness [ ]  Dysphagia Cardiac: [ ]  Chest pain/pressure, [ ]  Shortness of breath at rest [ ]  Shortness of breath with exertion, [ ]  Atrial fibrillation or irregular heartbeat  Vascular: [ ]  Pain in legs with walking, [ x] Pain in legs at rest, [ ]  Pain in legs at night,  [ ]  Non-healing ulcer, [ ]  Blood clot in vein/DVT,   Pulmonary: [ ]  Home oxygen, [ ]  Productive cough, [ ]  Coughing up blood, [ ]  Asthma,  [ ]  Wheezing [ ]  COPD Musculoskeletal:  [x ] Arthritis, [x ] Low back pain, [ ]  Joint pain Hematologic: [ ]  Easy Bruising, [x ] Anemia; [ ]  Hepatitis [x]  thrombocytopenia Gastrointestinal: [ ]  Blood in stool, [ ]  Gastroesophageal Reflux/heartburn, Urinary:  [ ]  chronic Kidney disease, [ ]  on HD - [ ]  MWF or [ ]  TTHS, [ ]  Burning with urination, [ ]  Difficulty urinating Skin: [ ]  Rashes, [ ]  Wounds Psychological: [ ]  Anxiety, [ ]  Depression  Physical Examination General:  WDWN in NAD, thin HENT: WNL Eyes: Pupils equal Pulmonary: normal non-labored breathing , without Rales, rhonchi,  wheezing Cardiac: RRR, without  Murmurs, occasional ectopic beat No carotid bruits Abdomen: soft, NT, no masses Skin: no rashes, ulcers noted;  no Gangrene , no cellulitis; no open wounds;  Right foot cold and pale as compared to left Left foot drop Vascular Exam/Pulses: 3+palpable Femoral pulses bilat 2+ DP palp on left + pop doppler signal on right Faint peroneal signal right NO PT/DP on right Musculoskeletal: positive muscle wasting/ atrophy; no edema  Neurologic: A&O X 3; Appropriate Affect ;  SENSATION: decreased on left LE; pt states he can feel light touch on right MOTOR FUNCTION: positive movement oin both feet left worse than right Speech is fluent/normal   Significant Diagnostic Studies: H/H  10.2/31.1 BUN/CR  34/0.82 PLT 37,000  ASSESSMENT/PLAN:Gabriel [name] is a 72 y.o. male with cold right foot x 5days, became painful. Pt with hx lung CA and thrombocytopenia. Pt with recent CVA. NO Hx A.fib Pt with probable thrombus in the distal BK POP and tibial vessels May need emergent thrombectomy.  Dr Gabriel [name] to assess   Gabriel [name] 09/05/2013 8:27 PM  Cold pulseless right lower extremity.  Several [name] history of coolness with slowly increasing pain and paresthesias since last Thursday (4 days).  Per ER in [place] he apparently had an arterial duplex 2 days ago that showed popliteal artery occlusion.  Pt has thrombocytopenia and currently undergoing chemo for metastatic lung CA.  He had an embolic stroke 3 weeks ago and has residual left side weakness.  PE: 2+ left DP pulse, absent pulse and doppler right foot, cool pale, some motor  intact minimal sensation  History and exam otherwise as above per Gabriel [name]'s note  To OR for embolectomy possible fasciotomy/bypass, risk benefits procedure discussed with patient and his family.  Continue heparin.  Risk of limb loss and further embolic events discussed.  Fabienne Bruns, MD [facility] [place] Office: [phone] Pager: [phone]

## 2013-09-12 NOTE — Preoperative (Signed)
Beta Blockers   Reason not to administer Beta Blockers:Not Applicable 

## 2013-09-13 ENCOUNTER — Inpatient Hospital Stay (HOSPITAL_COMMUNITY): Payer: Medicare Other

## 2013-09-13 ENCOUNTER — Other Ambulatory Visit: Payer: Self-pay

## 2013-09-13 DIAGNOSIS — R570 Cardiogenic shock: Secondary | ICD-10-CM

## 2013-09-13 DIAGNOSIS — I249 Acute ischemic heart disease, unspecified: Secondary | ICD-10-CM

## 2013-09-13 DIAGNOSIS — R002 Palpitations: Secondary | ICD-10-CM

## 2013-09-13 DIAGNOSIS — I6350 Cerebral infarction due to unspecified occlusion or stenosis of unspecified cerebral artery: Secondary | ICD-10-CM

## 2013-09-13 DIAGNOSIS — I200 Unstable angina: Secondary | ICD-10-CM

## 2013-09-13 LAB — MAGNESIUM: Magnesium: 1.5 mg/dL (ref 1.5–2.5)

## 2013-09-13 LAB — CBC
Hemoglobin: 9.6 g/dL — ABNORMAL LOW (ref 13.0–17.0)
MCH: 33.1 pg (ref 26.0–34.0)
Platelets: 43 10*3/uL — ABNORMAL LOW (ref 150–400)
RBC: 2.9 MIL/uL — ABNORMAL LOW (ref 4.22–5.81)

## 2013-09-13 LAB — POCT I-STAT 3, ART BLOOD GAS (G3+)
Bicarbonate: 22.3 mEq/L (ref 20.0–24.0)
Patient temperature: 98.6
pCO2 arterial: 36.9 mmHg (ref 35.0–45.0)
pH, Arterial: 7.389 (ref 7.350–7.450)
pO2, Arterial: 286 mmHg — ABNORMAL HIGH (ref 80.0–100.0)

## 2013-09-13 LAB — HEPATIC FUNCTION PANEL
Albumin: 2.5 g/dL — ABNORMAL LOW (ref 3.5–5.2)
Alkaline Phosphatase: 68 U/L (ref 39–117)
Bilirubin, Direct: 0.1 mg/dL (ref 0.0–0.3)
Indirect Bilirubin: 0.4 mg/dL (ref 0.3–0.9)
Total Bilirubin: 0.5 mg/dL (ref 0.3–1.2)

## 2013-09-13 LAB — BASIC METABOLIC PANEL
BUN: 30 mg/dL — ABNORMAL HIGH (ref 6–23)
CO2: 23 mEq/L (ref 19–32)
Calcium: 8.6 mg/dL (ref 8.4–10.5)
GFR calc non Af Amer: >90 mL/min (ref >90)
Glucose, Bld: 152 mg/dL — ABNORMAL HIGH (ref 70–99)
Potassium: 4 mEq/L (ref 3.5–5.1)
Sodium: 135 mEq/L (ref 135–145)

## 2013-09-13 LAB — LACTIC ACID, PLASMA: Lactic Acid, Venous: 2.5 mmol/L — ABNORMAL HIGH (ref 0.5–2.2)

## 2013-09-13 LAB — MRSA PCR SCREENING: MRSA by PCR: NEGATIVE

## 2013-09-13 LAB — TROPONIN I: Troponin I: >20 ng/mL (ref <0.30)

## 2013-09-13 LAB — HEPARIN LEVEL (UNFRACTIONATED): Heparin Unfractionated: 0.94 IU/mL — ABNORMAL HIGH (ref 0.30–0.70)

## 2013-09-13 MED ORDER — DEXTROSE 5 % IV SOLN
1.5000 g | Freq: Two times a day (BID) | INTRAVENOUS | Status: DC
  Administered 2013-09-13: 1.5 g via INTRAVENOUS
  Filled 2013-09-13 (×2): qty 1.5

## 2013-09-13 MED ORDER — PHENYLEPHRINE HCL 10 MG/ML IJ SOLN
30.0000 ug/min | INTRAVENOUS | Status: DC
  Administered 2013-09-13: 125 ug/min via INTRAVENOUS
  Administered 2013-09-13: 60 ug/min via INTRAVENOUS
  Filled 2013-09-13 (×2): qty 2

## 2013-09-13 MED ORDER — OXYCODONE-ACETAMINOPHEN 5-325 MG PO TABS: 1.0000 | ORAL_TABLET | ORAL | Status: DC | PRN

## 2013-09-13 MED ORDER — PHENOL 1.4 % MT LIQD: 1.0000 | OROMUCOSAL | Status: DC | PRN

## 2013-09-13 MED ORDER — HEPARIN (PORCINE) IN NACL 100-0.45 UNIT/ML-% IJ SOLN
900.0000 [IU]/h | INTRAMUSCULAR | Status: DC
  Administered 2013-09-13: 900 [IU]/h via INTRAVENOUS
  Filled 2013-09-13: qty 250

## 2013-09-13 MED ORDER — ONDANSETRON HCL 4 MG/2ML IJ SOLN: 4.0000 mg | Freq: Four times a day (QID) | INTRAMUSCULAR | Status: DC | PRN

## 2013-09-13 MED ORDER — LORAZEPAM 2 MG/ML IJ SOLN: 0.5000 mg | INTRAMUSCULAR | Status: DC | PRN

## 2013-09-13 MED ORDER — FENTANYL CITRATE 0.05 MG/ML IJ SOLN
INTRAMUSCULAR | Status: AC
  Filled 2013-09-13: qty 2

## 2013-09-13 MED ORDER — HYDROMORPHONE HCL PF 1 MG/ML IJ SOLN
0.5000 mg | INTRAMUSCULAR | Status: DC | PRN
  Administered 2013-09-14 (×2): 0.5 mg via INTRAVENOUS
  Administered 2013-09-14 – 2013-09-15 (×2): 1 mg via INTRAVENOUS
  Filled 2013-09-13 (×4): qty 1

## 2013-09-13 MED ORDER — PANTOPRAZOLE SODIUM 40 MG PO TBEC: 40.0000 mg | DELAYED_RELEASE_TABLET | Freq: Every day | ORAL | Status: DC

## 2013-09-13 MED ORDER — ALUM & MAG HYDROXIDE-SIMETH 200-200-20 MG/5ML PO SUSP: 15.0000 mL | ORAL | Status: DC | PRN

## 2013-09-13 MED ORDER — GUAIFENESIN-DM 100-10 MG/5ML PO SYRP: 15.0000 mL | ORAL_SOLUTION | ORAL | Status: DC | PRN

## 2013-09-13 MED ORDER — METOPROLOL TARTRATE 1 MG/ML IV SOLN: 2.0000 mg | INTRAVENOUS | Status: DC | PRN

## 2013-09-13 MED ORDER — NOREPINEPHRINE BITARTRATE 1 MG/ML IJ SOLN
2.0000 ug/min | INTRAVENOUS | Status: DC
  Administered 2013-09-13: 10 ug/min via INTRAVENOUS
  Filled 2013-09-13: qty 4

## 2013-09-13 MED ORDER — SODIUM CHLORIDE 0.9 % IV SOLN: 500.0000 mL | Freq: Once | INTRAVENOUS | Status: DC | PRN

## 2013-09-13 MED ORDER — AMIODARONE HCL IN DEXTROSE 360-4.14 MG/200ML-% IV SOLN
INTRAVENOUS | Status: AC
  Filled 2013-09-13: qty 200

## 2013-09-13 MED ORDER — HYDROMORPHONE HCL PF 1 MG/ML IJ SOLN
0.5000 mg | INTRAMUSCULAR | Status: DC | PRN
  Administered 2013-09-13: 0.5 mg via INTRAVENOUS
  Administered 2013-09-13: 1 mg via INTRAVENOUS
  Filled 2013-09-13 (×2): qty 1

## 2013-09-13 MED ORDER — SODIUM CHLORIDE 0.9 % IV SOLN
INTRAVENOUS | Status: DC
  Administered 2013-09-13: 75 mL via INTRAVENOUS
  Administered 2013-09-14: 06:00:00 via INTRAVENOUS

## 2013-09-13 MED ORDER — HYDROCORTISONE SOD SUCCINATE 100 MG IJ SOLR
100.0000 mg | Freq: Four times a day (QID) | INTRAMUSCULAR | Status: DC
  Administered 2013-09-13 – 2013-09-14 (×3): 100 mg via INTRAVENOUS
  Filled 2013-09-13 (×8): qty 2

## 2013-09-13 MED ORDER — POLYETHYLENE GLYCOL 3350 17 G PO PACK
17.0000 g | PACK | Freq: Every day | ORAL | Status: DC | PRN
  Filled 2013-09-13: qty 1

## 2013-09-13 MED ORDER — PHENYLEPHRINE HCL 10 MG/ML IJ SOLN
30.0000 ug/min | INTRAVENOUS | Status: DC
  Administered 2013-09-13: 125 ug/min via INTRAVENOUS
  Filled 2013-09-13: qty 4

## 2013-09-13 MED ORDER — ACETAMINOPHEN 650 MG RE SUPP: 325.0000 mg | RECTAL | Status: DC | PRN

## 2013-09-13 MED ORDER — ATROPINE SULFATE 1 % OP SOLN
2.0000 [drp] | OPHTHALMIC | Status: DC | PRN
Start: 2013-09-13 — End: 2013-09-15
  Administered 2013-09-14 – 2013-09-15 (×4): 2 [drp] via SUBLINGUAL
  Filled 2013-09-13: qty 2

## 2013-09-13 MED ORDER — ACETAMINOPHEN 325 MG PO TABS: 325.0000 mg | ORAL_TABLET | ORAL | Status: DC | PRN

## 2013-09-13 MED ORDER — DOCUSATE SODIUM 100 MG PO CAPS: 100.0000 mg | ORAL_CAPSULE | Freq: Every day | ORAL | Status: DC

## 2013-09-13 MED ORDER — LABETALOL HCL 5 MG/ML IV SOLN: 10.0000 mg | INTRAVENOUS | Status: DC | PRN

## 2013-09-13 MED ORDER — POTASSIUM CHLORIDE CRYS ER 20 MEQ PO TBCR: 20.0000 meq | EXTENDED_RELEASE_TABLET | Freq: Every day | ORAL | Status: DC | PRN

## 2013-09-13 MED ORDER — BISACODYL 10 MG RE SUPP: 10.0000 mg | Freq: Every day | RECTAL | Status: DC | PRN

## 2013-09-13 MED ORDER — FENTANYL CITRATE 0.05 MG/ML IJ SOLN
25.0000 ug | INTRAMUSCULAR | Status: DC | PRN
  Administered 2013-09-13: 25 ug via INTRAVENOUS

## 2013-09-13 MED ORDER — HYDRALAZINE HCL 20 MG/ML IJ SOLN: 10.0000 mg | INTRAMUSCULAR | Status: DC | PRN

## 2013-09-13 MED FILL — Medication: Qty: 1 | Status: AC

## 2013-09-13 MED FILL — Heparin Sodium (Porcine) 100 Unt/ML in Sodium Chloride 0.45%: INTRAMUSCULAR | Qty: 250 | Status: AC

## 2013-09-13 MED FILL — Nitroglycerin IV Soln 200 MCG/ML in D5W: INTRAVENOUS | Qty: 250 | Status: AC

## 2013-09-13 NOTE — Progress Notes (Signed)
ANTICOAGULATION CONSULT NOTE - Initial Consult  Pharmacy Consult for Heparin Indication: RLE thrombus  Allergies  Allergen Reactions  . Hydrocodone Rash    Patient took norco at home    Patient Measurements: Height: 5' 10.08" (178 cm) Weight: 141 lb 1.5 oz (64 kg) IBW/kg (Calculated) : 73.18  Vital Signs: Temp: 97.4 F (36.3 C) (11/18 0100) Temp src: Oral (11/17 2028) BP: 111/62 mmHg (11/18 0100) Pulse Rate: 97 (11/18 0100)  Labs:  Recent Labs  09/02/2013 2058  APTT >200*  LABPROT 15.5*  INR 1.26    Estimated Creatinine Clearance: 77.8 ml/min (by C-G formula based on Cr of 0.59).   Medical History: Past Medical History  Diagnosis Date  . Cancer   . Hypertension   . Glaucoma   . Shortness of breath     with activity  . Stroke 08/06/2013    tpa given  . NSTEMI (non-ST elevated myocardial infarction) 08/06/2013  . Thrombocytopenia 08/10/2013    Medications:  Prescriptions prior to admission  Medication Sig Dispense Refill  . ALPRAZolam (XANAX) 1 MG tablet Take 1 tablet by mouth 3 (three) times daily as needed for anxiety.       Marland Kitchen amLODipine (NORVASC) 10 MG tablet Take 1 tablet (10 mg total) by mouth daily.  30 tablet  3  . dexamethasone (DECADRON) 2 MG tablet Take 2 mg by mouth 2 (two) times daily with a meal.      . docusate sodium 100 MG CAPS Take 100 mg by mouth 2 (two) times daily as needed for constipation.  60 capsule  3  . fentaNYL (DURAGESIC - DOSED MCG/HR) 25 MCG/HR patch Place 1 patch onto the skin every 3 (three) days.      . Flaxseed, Linseed, (FLAX SEED OIL PO) Take 1 capsule by mouth daily.      Marland Kitchen ibuprofen (ADVIL,MOTRIN) 200 MG tablet Take 400 mg by mouth every 8 (eight) hours as needed for pain.      Marland Kitchen losartan (COZAAR) 100 MG tablet Take 1 tablet (100 mg total) by mouth daily.  30 tablet  3  . Multiple Vitamin (MULTIVITAMIN WITH MINERALS) TABS Take 1 tablet by mouth daily.  30 tablet  3  . ondansetron (ZOFRAN) 4 MG tablet Take 1 tablet (4 mg  total) by mouth every 6 (six) hours as needed for nausea.  45 tablet  0  . timolol (TIMOPTIC) 0.5 % ophthalmic solution Place 1 drop into both eyes every morning.  15 mL  4    Assessment: 72 yo male with RLE thrombus, s/p embolectomy, for heparin.  Received heparin 10,000 units IV in OR   Goal of Therapy:  Heparin level 0.3-0.7 units/ml Monitor platelets by anticoagulation protocol: Yes   Plan:  Start heparin 900 units/hr Check heparin level in 8 hours.  Wadell Craddock, Gary Fleet 09/13/2013,1:23 AM

## 2013-09-13 NOTE — Progress Notes (Signed)
Dr. Phillips Odor at bedside with palliative care team. Family wants to proceed with comfort measures only. Neo weaned off per MD. Pt currently less responsive and SBP decreased in 70s. Family at bedside. Will continue to monitor closely. Kerin Ransom, RN

## 2013-09-13 NOTE — Progress Notes (Addendum)
Called to see pt after v fib arrest with CPR and cardioversion.  Pt currently being transitioned from Levophed to Neosynephrine.  He is concious with reasonable oxygen saturation.  He is alert and oriented to person and place but not to situation.  He does not remember he had an operation.  He vaguely remembers he has cancer.  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  Prior to admission he had a DNR order at his SNF.  I have spoken with the pt's wife and sister in law who are both in agreement that his overall prognosis is poor in light of metastatic lung cancer recent stroke and MI and now recurrent MI.  I do not believe cardiac cath is in his best interest at this point.  The family is calling his son as well but at this point I would not be in favor of placing him on a ventilator in the event of respiratory failure.  We will continue to titrate pressors and continue heparin for now; however, I believe the ultimate pathway should be palliative rather than aggressive care.    Will follow up troponin and heparin levels and continue basic support for now.  However, in the event of recurrent event we should honor his DNR request that was made prior to these emergent conditions.  Case discussed with critical care service who will also participate in patient's care at this point moving forward.  Will have palliative care see pt later this morning if he stabilizes.   Acute blood loss anemia follow for now  Fabienne Bruns, MD [facility] Office: [phone] Pager: [phone]

## 2013-09-13 NOTE — Progress Notes (Signed)
PULMONARY  / CRITICAL CARE MEDICINE  Name: Gabriel [name] MRN: [id number] DOB: 1941/02/28    ADMISSION DATE:  09/22/2013 CONSULTATION DATE:  09/13/2013  REFERRING MD :Vascular PRIMARY SERVICE:  Vascular  CHIEF COMPLAINT:  VF/VT arrest  BRIEF PATIENT DESCRIPTION: 72 yo with past medical history of metastatic lung CA, recent CVA and MI admitted to Vascular service for management of cold right leg. He underwent right popliteal and tibial embolectomy with patch angioplasty of right popliteal artery and tibial peroneal trunk, 4 compartment fasciotomy.  He suffered brief VT/VF arrest postoperatively, was shocked x 1 with ROSC.  He was hypotensive post event requiring Levophed gtt. PCCM was consulted.  SIGNIFICANT EVENTS / STUDIES:  11/17  OR >>> Right popliteal and tibial embolectomy with patch angioplasty of right popliteal artery and tibial peroneal trunk, 4 compartment fasciotomy 11/17  VT/VF arrest >>> Epi x 1 / shock x 1 >>> ROSC  LINES / TUBES: Foley 11/17 >>> Medi-Port preadmission >>>  CULTURES:  ANTIBIOTICS: Zinacef 11/17 >>>   INTERVAL HISTORY:  Remains on pressor support; constipated  VITAL SIGNS: Temp:  [96.5 F (35.8 C)-98.3 F (36.8 C)] 97.2 F (36.2 C) (11/18 0749) Pulse Rate:  [78-140] 90 (11/18 0600) Resp:  [14-31] 21 (11/18 0845) BP: (75-137)/(45-99) 92/55 mmHg (11/18 0845) SpO2:  [94 %-100 %] 100 % (11/18 0800) FiO2 (%):  [100 %] 100 % (11/18 0800) Weight:  [64 kg (141 lb 1.5 oz)-68.8 kg (151 lb 10.8 oz)] 68.8 kg (151 lb 10.8 oz) (11/18 0130) HEMODYNAMICS:   VENTILATOR SETTINGS: Vent Mode:  [-]  FiO2 (%):  [100 %] 100 % INTAKE / OUTPUT: Intake/Output     11/17 0701 - 11/18 0700 11/18 0701 - 11/19 0700   I.V. (mL/kg) 1972.6 (28.7) 159 (2.3)   IV Piggyback 300    Total Intake(mL/kg) 2272.6 (33) 159 (2.3)   Urine (mL/kg/hr) 200 100 (0.7)   Blood 25    Total Output 225 100   Net +2047.6 +59          PHYSICAL EXAMINATION: General:  No acute  distress Neuro:  Awake and alert HEENT:  PERRL, NCAT Cardiovascular:  Tachy, regular Lungs:  Few crackles bilaterally, normal air entry Abdomen:  Soft, nontender, bowel sounds diminished Musculoskeletal:  No edema Skin:  Intact, dressing with oozing L leg  LABS: CBC  Recent Labs Lab 09/13/13 0325  WBC 30.3*  HGB 9.6*  HCT 29.8*  PLT 43*   Coag's  Recent Labs Lab 08/30/2013 2058  APTT >200*  INR 1.26   BMET  Recent Labs Lab 09/13/13 0325  NA 135  K 4.0  CL 100  CO2 23  BUN 30*  CREATININE 0.66  GLUCOSE 152*   Electrolytes  Recent Labs Lab 09/13/13 0325 09/13/13 0503  CALCIUM 8.6  --   MG  --  1.5   Sepsis Markers  Recent Labs Lab 09/13/13 0504  LATICACIDVEN 2.5*    ABG  Recent Labs Lab 09/13/13 0343  PHART 7.389  PCO2ART 36.9  PO2ART 286.0*   Liver Enzymes  Recent Labs Lab 09/13/13 0503  AST 480*  ALT 77*  ALKPHOS 68  BILITOT 0.5  ALBUMIN 2.5*    Cardiac Enzymes  Recent Labs Lab 09/13/13 0503  TROPONINI >20.00*    Glucose No results found for this basename: GLUCAP,  in the last 168 hours  CXR:  11/17 >>> Diffuse reticular - nodular densities, R medi-port   ASSESSMENT / PLAN:  PULMONARY A:  Lung CA with Mx to chest  wall / liver. P:   Gaol SpO2>92 No intubation O2 as needed  CARDIOVASCULAR A:  VT/VF arrest likely due to NSTEMI Poor candidate for coronary intervention given co morbidities, IV contrast load, thrombocytopenia, etc.  Cardiogenic shock.   Ischemic R foot s/p intervention. P:  Goal MAP>60 Continue heparin cautiously, 10/14 HIT panel negative Do not escalate neo Post op care per vascular   RENAL A:  Normal renal function. P:   NS@75  Check Mg  GASTROINTESTINAL A:  Liver Mx. P:   NPO as may need to be intubated Protonix for GI Px LFT  HEMATOLOGIC A:  Anemia (acute on chronic). Therapeutic anticoagulation (cold foot / ACS).  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  P:   Prophylactic abx per Vascular  ENDOCRINE  A:  No active issues.  P:   No intervention required  NEUROLOGIC A:  Acute encephalopathy improving  Post op pain. P:   Dilaudid / Percocet  Goals of care discussion by Me: family agrees to DNR; I discussed moving to inpatient hospice unit, they like the sound of this.  Will ask Palliative medicine to consider.  At this point don't titrate up neo, stop at 24 hours if no spontaneous improvement.  If palliative feels moving to inpatient hospice today is OK then fine by me to stop neo today.  Additional CC time by me 45 mintues  Yolonda Kida PCCM Pager: [phone] Cell: [phone] If no response, call [phone]   09/13/2013, 9:11 AM

## 2013-09-13 NOTE — Clinical Documentation Improvement (Signed)
THIS DOCUMENT IS NOT A PERMANENT PART OF THE MEDICAL RECORD  Please update your documentation with the medical record to reflect your response to this query. If you need help knowing how to do this please call [phone].  09/13/13  Dear Dr.Richa Shor Marton Redwood  In an effort to better capture your patient's severity of illness, reflect appropriate length of stay and utilization of resources, a review of the patient medical record has revealed the following indicators.    Based on your clinical judgment, please clarify and document in a progress note and/or discharge summary the clinical condition associated with the following supporting information:  In responding to this query please exercise your independent judgment.  The fact that a query is asked, does not imply that any particular answer is desired or expected.   Possible Clinical Conditions?   " Expected Acute Blood Loss Anemia  " Acute Blood Loss Anemia  " Acute on chronic blood loss anemia  " Other Condition  " Cannot Clinically Determine    Risk Factors:  Acute/chronic anemia noted per 11/18 progress notes.   Diagnostics: H&H on 11/18:  9.6/29.8  IV fluids / plasma expanders: Albumin:250 ml and LR:1000 ml per 11/17 Anesthesia record.  Reviewed: additional documentation in the medical record  Thank You,  Marciano Sequin, Clinical Documentation Specialist: [phone] Health Information Management [place]   Added to note  Fabienne Bruns, MD [facility] [place] Office: [phone] Pager: [phone]

## 2013-09-13 NOTE — Progress Notes (Signed)
PT Cancellation Note  Patient Details Name: Teejay Meader MRN: [id number] DOB: 01-14-1941   Cancelled Treatment:    Reason Eval/Treat Not Completed: Medical issues which prohibited therapy (Pt now comfort care.)   Annalina Needles 09/13/2013, 11:49 AM

## 2013-09-13 NOTE — Progress Notes (Signed)
CRITICAL VALUE ALERT  Critical value received:  Troponin >20  Date of notification:  11-18  Time of notification:  0620  Critical value read back: yes  Nurse who received alert:  Fatima Sanger RN  MD notified (1st page):  MD Deterding  Time of first page:    MD notified (2nd page):  Time of second page:  Responding MD:    Time MD responded:

## 2013-09-13 NOTE — Consult Note (Signed)
PULMONARY  / CRITICAL CARE MEDICINE  Name: Gabriel [name] MRN: [id number] DOB: 1941-01-11    ADMISSION DATE:  10-08-13 CONSULTATION DATE:  09/13/2013  REFERRING MD :Vascular PRIMARY SERVICE:  Vascular  CHIEF COMPLAINT:  VF/VT arrest  BRIEF PATIENT DESCRIPTION: 72 yo with past medical history of metastatic lung CA, recent CVA and MI admitted to Vascular service for management of cold right leg. He underwent right popliteal and tibial embolectomy with patch angioplasty of right popliteal artery and tibial peroneal trunk, 4 compartment fasciotomy.  He suffered brief VT/VF arrest postoperatively, was shocked x 1 with ROSC.  He was hypotensive post event requiring Levophed gtt. PCCM was consulted.  SIGNIFICANT EVENTS / STUDIES:  11/17  OR >>> Right popliteal and tibial embolectomy with patch angioplasty of right popliteal artery and tibial peroneal trunk, 4 compartment fasciotomy 11/17  VT/VF arrest >>> Epi x 1 / shock x 1 >>> ROSC  LINES / TUBES: Foley 11/17 >>> Medi-Port preadmission >>>  CULTURES:  ANTIBIOTICS: Zinacef 11/17 >>>  The patient is encephalopathic and unable to provide history, which was obtained for available medical records.  HISTORY OF PRESENT ILLNESS:  72 yo with past medical history of metastatic lung CA, recent CVA and MI admitted to Vascular service for management of cold right leg. He underwent right popliteal and tibial embolectomy with patch angioplasty of right popliteal artery and tibial peroneal trunk, 4 compartment fasciotomy.  He suffered brief VT/VF arrest postoperatively, was shocked x 1 with ROSC.  He was hypotensive post event requiring Levophed gtt. PCCM was consulted.  PAST MEDICAL HISTORY :  Past Medical History  Diagnosis Date  . Cancer   . Hypertension   . Glaucoma   . Shortness of breath     with activity  . Stroke 08/06/2013    tpa given  . NSTEMI (non-ST elevated myocardial infarction) 08/06/2013  . Thrombocytopenia 08/10/2013   Past  Surgical History  Procedure Laterality Date  . Cataract extraction, bilateral Bilateral 2009   Prior to Admission medications   Medication Sig Start Date End Date Taking? Authorizing Provider  ALPRAZolam Prudy Feeler) 1 MG tablet Take 1 tablet by mouth 3 (three) times daily as needed for anxiety.  07/28/13   Historical Provider, MD  amLODipine (NORVASC) 10 MG tablet Take 1 tablet (10 mg total) by mouth daily. 02/12/13   Alison Murray, MD  dexamethasone (DECADRON) 2 MG tablet Take 2 mg by mouth 2 (two) times daily with a meal.    Historical Provider, MD  docusate sodium 100 MG CAPS Take 100 mg by mouth 2 (two) times daily as needed for constipation. 02/12/13   Alison Murray, MD  fentaNYL (DURAGESIC - DOSED MCG/HR) 25 MCG/HR patch Place 1 patch onto the skin every 3 (three) days.    Historical Provider, MD  Flaxseed, Linseed, (FLAX SEED OIL PO) Take 1 capsule by mouth daily.    Historical Provider, MD  ibuprofen (ADVIL,MOTRIN) 200 MG tablet Take 400 mg by mouth every 8 (eight) hours as needed for pain.    Historical Provider, MD  losartan (COZAAR) 100 MG tablet Take 1 tablet (100 mg total) by mouth daily. 02/12/13   Alison Murray, MD  Multiple Vitamin (MULTIVITAMIN WITH MINERALS) TABS Take 1 tablet by mouth daily. 02/12/13   Alison Murray, MD  ondansetron (ZOFRAN) 4 MG tablet Take 1 tablet (4 mg total) by mouth every 6 (six) hours as needed for nausea. 02/12/13   Alison Murray, MD  timolol (TIMOPTIC) 0.5 % ophthalmic  solution Place 1 drop into both eyes every morning. 02/12/13   Alison Murray, MD   Allergies  Allergen Reactions  . Hydrocodone Rash    Patient took norco at home    FAMILY HISTORY:  Family History  Problem Relation Age of Onset  . Heart failure Mother   . Stroke Father   . Heart disease Brother    SOCIAL HISTORY:  reports that he has quit smoking. He does not have any smokeless tobacco history on file. He reports that he does not drink alcohol or use illicit drugs.  REVIEW OF  SYSTEMS:  Unable to provide.  INTERVAL HISTORY:  VITAL SIGNS: Temp:  [96.5 F (35.8 C)-98.3 F (36.8 C)] 97.6 F (36.4 C) (11/18 0130) Pulse Rate:  [78-140] 140 (11/18 0400) Resp:  [14-18] 14 (11/18 0300) BP: (90-137)/(50-99) 90/50 mmHg (11/18 0300) SpO2:  [94 %-100 %] 98 % (11/18 0400) Weight:  [64 kg (141 lb 1.5 oz)-68.8 kg (151 lb 10.8 oz)] 68.8 kg (151 lb 10.8 oz) (11/18 0130) HEMODYNAMICS:   VENTILATOR SETTINGS:   INTAKE / OUTPUT: Intake/Output     11/17 0701 - 11/18 0700   I.V. (mL/kg) 1252 (18.2)   IV Piggyback 250   Total Intake(mL/kg) 1502 (21.8)   Urine (mL/kg/hr) 200   Blood 25   Total Output 225   Net +1277         PHYSICAL EXAMINATION: General:  Appears acutely ill Neuro:  Encephalopathic, nonfocal, cough / gag diminished HEENT:  PERRL Cardiovascular:  Irregular, tavhycardic Lungs:  Bilateral diminished air entry, no w/r/r Abdomen:  Soft, nontender, bowel sounds diminished Musculoskeletal:  No edema Skin:  Intact  LABS: CBC  Recent Labs Lab 09/13/13 0325  WBC 30.3*  HGB 9.6*  HCT 29.8*  PLT PENDING   Coag's  Recent Labs Lab 08/29/2013 2058  APTT >200*  INR 1.26   BMET  Recent Labs Lab 09/13/13 0325  NA 135  K 4.0  CL 100  CO2 23  BUN 30*  CREATININE 0.66  GLUCOSE 152*   Electrolytes  Recent Labs Lab 09/13/13 0325  CALCIUM 8.6   Sepsis Markers No results found for this basename: LATICACIDVEN, PROCALCITON, O2SATVEN,  in the last 168 hours  ABG  Recent Labs Lab 09/13/13 0343  PHART 7.389  PCO2ART 36.9  PO2ART 286.0*   Liver Enzymes No results found for this basename: AST, ALT, ALKPHOS, BILITOT, ALBUMIN,  in the last 168 hours  Cardiac Enzymes No results found for this basename: TROPONINI, PROBNP,  in the last 168 hours  Glucose No results found for this basename: GLUCAP,  in the last 168 hours  CXR:  11/17 >>> Diffuse reticular - nodular densities, R medi-port   ASSESSMENT / PLAN:  PULMONARY A:  Lung CA  with Mx to chest wall / liver. P:   Gaol SpO2>92 Supplemental oxygen PRN Would avoid intubation and use BiPAP PRN if distress  CARDIOVASCULAR A: CAD.  Recent NSTEMI. VT/VF arrest.  Possible ACS but poor candidate for coronary intervention given co morbidities, IV contrast load, thrombocytopenia, etc.  Cardiogenic shock.  Ischemic R foot s/p intervention. P:  Goal MAP>60 Trend troponin / lactate TTE D/c Levophed Start Neo-Synephrine Amiodarone bolus Heparin gtt Hold ASA as significant thrombocytopenia Hold BB and ACEI as hypotensive Vascular following   RENAL A:  Normal renal function. P:   Trend BMP NS@75  Check Mg  GASTROINTESTINAL A:  Liver Mx. P:   NPO as may need to be intubated Protonix for GI Px  LFT  HEMATOLOGIC A:  Anemia (acute on chronic). Therapeutic anticoagulation (cold foot / ACS). P:  Trend CBC DVT Px is not indicated (on Heparin gtt)  INFECTIOUS A:  Leucocytosis is likely reactive.  P:   Prophylactic abx per Vascular  ENDOCRINE  A:  No active issues.  P:   No intervention required  NEUROLOGIC A:  Acute encephalopathy. Doubt significant anoxia.  Post op pain. P:   Dilaudid / Percocet  Goals of care discussion by Dr. Darrick Penna >>> Patient is unable to make decisions, significant other / sister (?) suggest DNR, awaiting call from son.  Patient's previous wishes were reportedly "DNR".  I have personally obtained history, examined patient, evaluated and interpreted laboratory and imaging results, reviewed medical records, formulated assessment / plan and placed orders.  CRITICAL CARE:  The patient is critically ill with multiple organ systems failure and requires high complexity decision making for assessment and support, frequent evaluation and titration of therapies, application of advanced monitoring technologies and extensive interpretation of multiple databases. Critical Care Time devoted to patient care services described in this note is 45  minutes.   Lonia Farber, MD Pulmonary and Critical Care Medicine [facility] Pager: [phone]  09/13/2013, 4:46 AM

## 2013-09-13 NOTE — Anesthesia Postprocedure Evaluation (Signed)
  Anesthesia Post-op Note  Patient: Gabriel [name]  Procedure(s) Performed: Procedure(s): EMBOLECTOMY Right Popliteal Artery with fasciotomy right lower lateral leg (Right) PATCH ANGIOPLASTY Right Popliteal Artery using a 1cm by 6cm Vascu-Guard bovine patch (Right)  Patient Location: PACU  Anesthesia Type:General  Level of Consciousness: awake  Airway and Oxygen Therapy: Patient Spontanous Breathing  Post-op Pain: mild  Post-op Assessment: Post-op Vital signs reviewed, Patient's Cardiovascular Status Stable, Respiratory Function Stable, Patent Airway, No signs of Nausea or vomiting and Pain level controlled  Post-op Vital Signs: Reviewed and stable  Complications: No apparent anesthesia complications

## 2013-09-13 NOTE — Progress Notes (Signed)
Son on phone currently with family at this time. Per wife and son patient wishes are to be a DNR, no efforts via intubation, chest compression, defibrillation, or anti arrhythmics are to be performed from this point on in case of patient decline in status again. Three nurse confirmation Marthann Schiller, Fatima Sanger, and Kerin Ransom RN. Will continue to monitor the patient closely.

## 2013-09-13 NOTE — Progress Notes (Signed)
CSW received consult that patient is from a SNF. CSW reviewed chart and noticed plan is for possible GIP and comfort care measures. CSW will be available for support as needed.   Maree Krabbe, MSW, Theresia Majors [phone]

## 2013-09-13 NOTE — Transfer of Care (Signed)
Immediate Anesthesia Transfer of Care Note  Patient: Gabriel [name]  Procedure(s) Performed: Procedure(s): EMBOLECTOMY Right Popliteal Artery with fasciotomy right lower lateral leg (Right) PATCH ANGIOPLASTY Right Popliteal Artery using a 1cm by 6cm Vascu-Guard bovine patch (Right)  Patient Location: PACU  Anesthesia Type:General  Level of Consciousness: awake  Airway & Oxygen Therapy: Patient Spontanous Breathing and Patient connected to nasal cannula oxygen  Post-op Assessment: Report given to PACU RN and Post -op Vital signs reviewed and stable  Post vital signs: Reviewed and stable  Complications: No apparent anesthesia complications

## 2013-09-13 NOTE — Progress Notes (Signed)
Palliative Medicine Team   Met with family at bedside. They have elected for full comfort care. Bobby is requiring a Neo-Synephrine infusion at moderately high doses to maintain a MAP of >60. He is s/p post-limb revascularization (arterial clot) complicated by post-op cardiac arrest. He is currently in Surgical ICU. At this point it is reasonable to accept that his body is failing and that his cancer and his multiple medical problems will lead to his death.  1. DNR 2. Wean Pressors Today- cut in half now. No Lower MAP limit. Turn off prior to moving to regular floor. (6-NORTH) or sonner if he declines and is unable to move off unit. 3. He is remarkably alert given his pressures 4. If patient survives the evening will consider GIP Hospice Admission-he is too unstable for transport to Hospice Facility. His wife is exhausted and "sat up all night in a chair"- they need a more comfortable space if we can get him there.  After turning his pressors down by half his pressure has already dropped significantly and he is starting to decline- becoming less responsive. Family are all around the bedside. Anticipate death within hours.  Anderson Malta, DO Palliative Medicine

## 2013-09-13 NOTE — Progress Notes (Signed)
ANTICOAGULATION CONSULT NOTE - Follow Up Consult  Pharmacy Consult for Heparin  Indication: RLE Thrombus  Allergies  Allergen Reactions  . Hydrocodone Rash    Patient took norco at home   Patient Measurements: Height: 5\' 10"  (177.8 cm) Weight: 151 lb 10.8 oz (68.8 kg) IBW/kg (Calculated) : 73  Vital Signs: Temp: 96.5 F (35.8 C) (11/18 0400) Temp src: Oral (11/18 0400) BP: 101/68 mmHg (11/18 0520) Pulse Rate: 93 (11/18 0520)  Labs:  Recent Labs  09/18/2013 2058 09/13/13 0325 09/13/13 0503  HGB  --  9.6*  --   HCT  --  29.8*  --   PLT  --  43*  --   APTT >200*  --   --   LABPROT 15.5*  --   --   INR 1.26  --   --   HEPARINUNFRC  --   --  0.94*  CREATININE  --  0.66  --    Estimated Creatinine Clearance: 83.6 ml/min (by C-G formula based on Cr of 0.66).  Medications:  Heparin 900 units/hr  Assessment: 72 y/o M with RLE thrombus, s/p embolectomy, s/p brief VT/VF arrest, large BOLUS of heparin in the OR, was on heparin drip PTA from OSH, now on heparin drip here ~ 3 hours, HL 0.94 (expected to be slightly high at this time given timing).   Goal of Therapy:  Heparin level 0.3-0.7 units/ml Monitor platelets by anticoagulation protocol: Yes   Plan:  -Continue heparin drip at 900 units/hr  -Draw already scheduled HL at 1200 -Daily CBC/HL -Monitor for bleeding  Thank you for allowing me to take part in this patient's care,  Abran Duke, PharmD Clinical Pharmacist Phone: [phone] Pager: [phone] 09/13/2013 6:15 AM

## 2013-09-13 NOTE — Significant Event (Signed)
  Patient Name: Gabriel [name]   MRN: [id number]   Date of Birth/ Sex: 12-23-1940 , male      Admission Date: 09/11/2013  Attending Provider: Sherren Kerns, MD  Primary Diagnosis: Acute ischemia Right foot   Indication: Patient was post op from R popliteal and tibial embolectomy.  Nursing staff reported he was less responsive, Telemetry monitoring reportedly showed V fib and patient was pulseless.  Code blue was subsequently called. At the time of arrival on scene, ACLS protocol was underway. Patient had already received 1 dose of epinephrine, cardioversion, and chest compressions were underway.   Technical Description:  - CPR performance duration:  ~5  minutes  - Was defibrillation or cardioversion used? Yes   - Was external pacer placed? Yes  - Was patient intubated pre/post CPR? No   Medications Administered: Y = Yes; Blank = No Amiodarone  y  Atropine    Calcium    Epinephrine  y  Lidocaine    Magnesium    Norepinephrine  y  Phenylephrine  y  Sodium bicarbonate    Vasopressin     Post CPR evaluation:  - Final Status - Was patient successfully resuscitated ? Yes - What is current rhythm? A fib with RVR - What is current hemodynamic status? Hypotensive on pressor support  Miscellaneous Information:  - Labs sent, including: CBC, BMP, Troponin, Lactic Acid  - Primary team notified?  Yes- Dr. Darrick Penna  - Family Notified? Yes- Per primary  - Additional notes/ transfer status: Code Team Dr. Darrick Penna and Dr. Marin Shutter at bedside.     Carlynn Purl, DO  09/13/2013, 4:15 AM

## 2013-09-13 NOTE — Progress Notes (Signed)
Nutrition Brief Note  Patient identified on the Malnutrition Screening Tool Report. Patient's goal of care has transitioned to comfort measures.  No nutrition interventions warranted at this time.  Please consult as needed.   Maureen Chatters, RD, LDN Pager #: [phone] After-Hours Pager #: [phone]

## 2013-09-13 NOTE — Progress Notes (Signed)
In to see patient at this time, noticed alarming monitor. Upon assessment of patient in room, patient was non responsive and currently via monitor in V-fib. Crash cart called for, CODE called and started. Refer to code sheet for further details. MD fields paged at this time.

## 2013-09-14 MED ORDER — BISACODYL 10 MG RE SUPP: 10.0000 mg | Freq: Every day | RECTAL | Status: DC | PRN

## 2013-09-14 MED ORDER — FENTANYL 50 MCG/HR TD PT72
100.0000 ug | MEDICATED_PATCH | TRANSDERMAL | Status: DC
  Administered 2013-09-14: 100 ug via TRANSDERMAL
  Filled 2013-09-14: qty 2

## 2013-09-14 NOTE — Progress Notes (Signed)
comfort approach pccm to sign off  Mcarthur Rossetti. Tyson Alias, MD, FACP Pgr: [phone] [facility]

## 2013-09-14 NOTE — Addendum Note (Signed)
Addendum created 09/14/13 1248 by Bedelia Person, MD   Modules edited: Anesthesia Attestations

## 2013-09-14 NOTE — Progress Notes (Signed)
Chaplain offered ministry of presence, emotional and spiritual support for the patient and the patient's family. The family requested prayer. Chaplain provided spiritual support through prayer.   09/14/13 1500  Clinical Encounter Type  Visited With Patient and family together  Visit Type Initial;Spiritual support  Spiritual Encounters  Spiritual Needs Prayer;Emotional

## 2013-09-14 NOTE — Progress Notes (Signed)
VVS Progress Note:  Pt on comfort care. Family at bedside.  Filed Vitals:   09/13/13 1600 09/13/13 1700 09/13/13 2100 09/14/13 0536  BP:    60/41  Pulse:      Temp:      TempSrc:      Resp: 28 23 20 19   Height:      Weight:      SpO2: 94% 93% 92% 91%   BLE warm Incision healing  Disposition per palliative care.  Jlon Betker J 9:27 AM 09/14/2013

## 2013-09-14 NOTE — Progress Notes (Signed)
Patient transferred to 6N. CSW reported off to the social worker, Darlyn Chamber, who will be available for support. This Child psychotherapist is signing off at this time.  Maree Krabbe, MSW, Theresia Majors [phone]

## 2013-09-15 LAB — TYPE AND SCREEN
ABO/RH(D): A POS
Unit division: 0

## 2013-09-16 ENCOUNTER — Encounter (HOSPITAL_COMMUNITY): Payer: Self-pay | Admitting: Vascular Surgery

## 2013-09-21 NOTE — Discharge Summary (Signed)
Lars Mage, PA-C PHYSICIAN ASSISTANT CERTIFIED Signed Vascular Surgery Progress Notes Service date: 2013/10/08 8:40 AM  [facility] Dece[facility] Summary     Patient ID:  Gabriel [name] MRN: [id number] DOB/AGE: 70/23/1942 70 y.o.   Admit date: 09/06/2013 Discharge date: October 08, 2013 Date of Surgery: 09/02/2013 - 09/13/2013 Surgeon: Moishe Spice): Sherren Kerns, MD   Admission Diagnosis: cold foot trans   cold right leg   Discharge Diagnoses:  cold foot trans   cold right leg   Secondary Diagnoses: Past Medical History   Diagnosis  Date   .  Cancer     .  Hypertension     .  Glaucoma     .  Shortness of breath         with activity   .  Stroke  08/06/2013       tpa given   .  NSTEMI (non-ST elevated myocardial infarction)  08/06/2013   .  Thrombocytopenia  08/10/2013      Procedure(s): EMBOLECTOMY Right Popliteal Artery with fasciotomy right lower lateral leg PATCH ANGIOPLASTY Right Popliteal Artery using a 1cm by 6cm Vascu-Guard bovine patch   Discharged Condition: deceased   HPI: : Gabriel [name] is a 72 y.o. male with Hx recent CVA with left sided weakness, metastatic lung CA (chemo and radiation), HTN recent NSTEMI in Oct. Who was at rehab in [place]. Her was taken to [facility] hosp with cold left lower leg. Pt states he has had a cold painful right foot since last thursday.   EKG with NSR and occasional PVC   Pt has hx thrombocytopenia prob sec to chemotherapy - PLT count today 37,000   Pt appears comfortable   Pt was not started on ASA or other antiplt/ anticoag meds because of low PLT.   Pt former smoker 2PPD; quit 15 years ago             He was taken to the OR at 11:43 pm for Right popliteal and tibial embolectomy with patch angioplasty of right popliteal artery and tibial peroneal trunk, 4 compartment fasciotomy.                Called to see pt after v fib arrest with CPR and cardioversion. Pt currently being transitioned from Levophed to  Neosynephrine. He is concious with reasonable oxygen saturation. He is alert and oriented to person and place but not to situation. He does not remember he had an operation. He vaguely remembers he has cancer. 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 Prior to admission he had a DNR order at his SNF. I have spoken with the pt's wife and sister in law who are both in agreement that his overall prognosis is poor in light of metastatic lung cancer recent stroke and MI and now recurrent MI. I do not believe cardiac cath is in his best interest at this point. The family is calling his son as well but at this point I would not be in favor of placing him on a ventilator in the event of respiratory failure. We will continue to titrate pressors and continue heparin for now; however, I believe the ultimate pathway should be palliative rather than aggressive care.   Will follow up troponin and heparin levels and continue basic support for now. However, in the event of recurrent event we should honor his DNR request that was made prior to these emergent conditions. Case discussed with critical care service who will also participate in patient's care at this point  moving forward. Will have palliative care see pt later this morning if he stabilizes.   Acute blood loss anemia follow for now.  Critical care consult by Lonia Farber, MD.  We will follow patient's previous wishes and son's for DNR.  Palliative care team Dr. Edsel Petrin assisted with DNR and comfort care.    1. DNR   2. Wean Pressors Today- cut in half now. No Lower MAP limit. Turn off prior to moving to regular floor. (6-NORTH) or sonner if he declines and is unable to move off unit.   3. He is remarkably alert given his pressures   4. If patient survives the evening will consider [facility] Hospice Admission-he is too unstable for transport to Hospice Facility. His wife is exhausted and "sat up all night in a chair"- they need a more comfortable space if  we can get him there.   Patient was deceased this morning 0805.                 Hospital Course:  Gabriel [name] is a 72 y.o. male is S/P Right Procedure(s): EMBOLECTOMY Right Popliteal Artery with fasciotomy right lower lateral leg PATCH ANGIOPLASTY Right Popliteal Artery using a 1cm by 6cm Vascu-Guard bovine patch Extubated: POD # 0 Physical exam: Deceased     Consults:   Treatment Team:  Sherren Kerns, MD [facility]   Significant Diagnostic Studies: CBC Lab Results   Component  Value  Date     WBC  30.3*  09/13/2013     HGB  9.6*  09/13/2013     HCT  29.8*  09/13/2013     MCV  102.8*  09/13/2013     PLT  43*  09/13/2013      BMET    Component  Value  Date/Time     NA  135  09/13/2013 0325     K  4.0  09/13/2013 0325     CL  100  09/13/2013 0325     CO2  23  09/13/2013 0325     GLUCOSE  152*  09/13/2013 0325     BUN  30*  09/13/2013 0325     CREATININE  0.66  09/13/2013 0325     CALCIUM  8.6  09/13/2013 0325     GFRNONAA  >90  09/13/2013 0325     GFRAA  >90  09/13/2013 0325    COAG Lab Results   Component  Value  Date     INR  1.26  09/10/2013     INR  1.07  08/09/2013     INR  1.00  02/10/2013        Disposition:   Discharge to deceased    Medication List      ASK your doctor about these medications           ALPRAZolam 1 MG tablet   Commonly known as:  XANAX   Take 1 tablet by mouth 3 (three) times daily as needed for anxiety.         atorvastatin 80 MG tablet   Commonly known as:  LIPITOR   Take 80 mg by mouth daily.         calcium carbonate 1250 MG tablet   Commonly known as:  OS-CAL - dosed in mg of elemental calcium   Take 1 tablet by mouth daily with breakfast.         celecoxib 100 MG capsule   Commonly known as:  CELEBREX  Take 100 mg by mouth 2 (two) times daily.         Cholecalciferol 1200 UNIT/15ML Liqd   Take 15 mLs by mouth daily.         dexamethasone 4 MG tablet   Commonly known as:   DECADRON   Take 4 mg by mouth 2 (two) times daily with a meal.         fentaNYL 100 MCG/HR   Commonly known as:  DURAGESIC - dosed mcg/hr   Place 100 mcg onto the skin every 3 (three) days.         fluticasone 50 MCG/ACT nasal spray   Commonly known as:  FLONASE   Place 1 spray into both nostrils daily.         loratadine 10 MG tablet   Commonly known as:  CLARITIN   Take 10 mg by mouth daily as needed for allergies.         multivitamin Liqd   Take 5 mLs by mouth daily.         omeprazole 20 MG capsule   Commonly known as:  PRILOSEC   Take 20 mg by mouth daily.         polyethylene glycol packet   Commonly known as:  MIRALAX / GLYCOLAX   Take 17 g by mouth daily.         ranitidine 150 MG tablet   Commonly known as:  ZANTAC   Take 150 mg by mouth 2 (two) times daily.         tamsulosin 0.4 MG Caps capsule   Commonly known as:  FLOMAX   Take 0.4 mg by mouth daily.         timolol 0.5 % ophthalmic solution   Commonly known as:  TIMOPTIC   Place 1 drop into both eyes every morning.               SignedMosetta Pigeon 09/07/2013, 8:50 AM      Revision History...     Date/Time User Action   09/14/2013 9:02 AM Lars Mage, PA-C Sign   09/23/2013 8:43 AM Lars Mage, PA-C Share  View Details Report

## 2013-09-26 NOTE — Progress Notes (Signed)
Call placed to [facility], state patient is suitable for eyes.  RN to perform eye prep prior to patient being taken to morgue.

## 2013-09-26 NOTE — Progress Notes (Signed)
Notified by tech that patient had stopped breathing.  RN confirmed no respirations and no pulse.  Family at bedside.  Encouraged family to take all time that they needed.  Page sent out to Dr. Darrick Penna and palliative team to notify of patients death.

## 2013-09-26 NOTE — Progress Notes (Signed)
[facility] Dece[facility] Summary   Patient ID:  Gabriel [name] MRN: [id number] DOB/AGE: 1941/10/08 72 y.o.  Admit date: 09/07/2013 Discharge date: 09/07/2013 Date of Surgery: 08/27/2013 - 09/13/2013 Surgeon: Moishe Spice): Sherren Kerns, MD  Admission Diagnosis: cold foot trans  cold right leg  Discharge Diagnoses:  cold foot trans  cold right leg  Secondary Diagnoses: Past Medical History  Diagnosis Date  . Cancer   . Hypertension   . Glaucoma   . Shortness of breath     with activity  . Stroke 08/06/2013    tpa given  . NSTEMI (non-ST elevated myocardial infarction) 08/06/2013  . Thrombocytopenia 08/10/2013    Procedure(s): EMBOLECTOMY Right Popliteal Artery with fasciotomy right lower lateral leg PATCH ANGIOPLASTY Right Popliteal Artery using a 1cm by 6cm Vascu-Guard bovine patch  Discharged Condition: deceased  HPI: : Gabriel [name] is a 72 y.o. male with Hx recent CVA with left sided weakness, metastatic lung CA (chemo and radiation), HTN recent NSTEMI in Oct. Who was at rehab in [place]. Her was taken to [facility] hosp with cold left lower leg. Pt states he has had a cold painful right foot since last thursday.  EKG with NSR and occasional PVC  Pt has hx thrombocytopenia prob sec to chemotherapy - PLT count today 37,000  Pt appears comfortable  Pt was not started on ASA or other antiplt/ anticoag meds because of low PLT.  Pt former smoker 2PPD; quit 15 years ago  He was taken to the OR at 11:43 pm for Right popliteal and tibial embolectomy with patch angioplasty of right popliteal artery and tibial peroneal trunk, 4 compartment fasciotomy.    Called to see pt after v fib arrest with CPR and cardioversion. Pt currently being transitioned from Levophed to Neosynephrine. He is concious with reasonable oxygen saturation. He is alert and oriented to person and place but not to situation. He does not remember he had an operation. He vaguely remembers he has  cancer. 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 Prior to admission he had a DNR order at his SNF. I have spoken with the pt's wife and sister in law who are both in agreement that his overall prognosis is poor in light of metastatic lung cancer recent stroke and MI and now recurrent MI. I do not believe cardiac cath is in his best interest at this point. The family is calling his son as well but at this point I would not be in favor of placing him on a ventilator in the event of respiratory failure. We will continue to titrate pressors and continue heparin for now; however, I believe the ultimate pathway should be palliative rather than aggressive care.  Will follow up troponin and heparin levels and continue basic support for now. However, in the event of recurrent event we should honor his DNR request that was made prior to these emergent conditions. Case discussed with critical care service who will also participate in patient's care at this point moving forward. Will have palliative care see pt later this morning if he stabilizes.  Acute blood loss anemia follow for now.  Critical care consult by Lonia Farber, MD.  We will follow patient's previous wishes and son's for DNR.  Palliative care team Dr. Edsel Petrin assisted with DNR and comfort care.   1. DNR  2. Wean Pressors Today- cut in half now. No Lower MAP limit. Turn off prior to moving to regular floor. (6-NORTH) or sonner if he declines and is  unable to move off unit.  3. He is remarkably alert given his pressures  4. If patient survives the evening will consider [facility]IP Hospice Admission-he is too unstable for transport to Hospice Facility. His wife is exhausted and "sat up all night in a chair"- they need a more comfortable space if we can get him there.  Patient was deceased this morning 0805.          Hospital Course:  Gabriel [name] is a 72 y.o. male is S/P Right Procedure(s): EMBOLECTOMY Right Popliteal Artery with  fasciotomy right lower lateral leg PATCH ANGIOPLASTY Right Popliteal Artery using a 1cm by 6cm Vascu-Guard bovine patch Extubated: POD # 0 Physical exam: Deceased   Consults:  Treatment Team:  Sherren Kerns, MD [facility]  Significant Diagnostic Studies: CBC Lab Results  Component Value Date   WBC 30.3* 09/13/2013   HGB 9.6* 09/13/2013   HCT 29.8* 09/13/2013   MCV 102.8* 09/13/2013   PLT 43* 09/13/2013    BMET    Component Value Date/Time   NA 135 09/13/2013 0325   K 4.0 09/13/2013 0325   CL 100 09/13/2013 0325   CO2 23 09/13/2013 0325   GLUCOSE 152* 09/13/2013 0325   BUN 30* 09/13/2013 0325   CREATININE 0.66 09/13/2013 0325   CALCIUM 8.6 09/13/2013 0325   GFRNONAA >90 09/13/2013 0325   GFRAA >90 09/13/2013 0325   COAG Lab Results  Component Value Date   INR 1.26    INR 1.07 08/09/2013   INR 1.00 02/10/2013     Disposition:  Discharge to deceased    Medication List    ASK your doctor about these medications       ALPRAZolam 1 MG tablet  Commonly known as:  XANAX  Take 1 tablet by mouth 3 (three) times daily as needed for anxiety.     atorvastatin 80 MG tablet  Commonly known as:  LIPITOR  Take 80 mg by mouth daily.     calcium carbonate 1250 MG tablet  Commonly known as:  OS-CAL - dosed in mg of elemental calcium  Take 1 tablet by mouth daily with breakfast.     celecoxib 100 MG capsule  Commonly known as:  CELEBREX  Take 100 mg by mouth 2 (two) times daily.     Cholecalciferol 1200 UNIT/15ML Liqd  Take 15 mLs by mouth daily.     dexamethasone 4 MG tablet  Commonly known as:  DECADRON  Take 4 mg by mouth 2 (two) times daily with a meal.     fentaNYL 100 MCG/HR  Commonly known as:  DURAGESIC - dosed mcg/hr  Place 100 mcg onto the skin every 3 (three) days.     fluticasone 50 MCG/ACT nasal spray  Commonly known as:  FLONASE  Place 1 spray into both nostrils daily.     loratadine 10 MG tablet  Commonly known as:   CLARITIN  Take 10 mg by mouth daily as needed for allergies.     multivitamin Liqd  Take 5 mLs by mouth daily.     omeprazole 20 MG capsule  Commonly known as:  PRILOSEC  Take 20 mg by mouth daily.     polyethylene glycol packet  Commonly known as:  MIRALAX / GLYCOLAX  Take 17 g by mouth daily.     ranitidine 150 MG tablet  Commonly known as:  ZANTAC  Take 150 mg by mouth 2 (two) times daily.     tamsulosin 0.4 MG Caps capsule  Commonly  known as:  FLOMAX  Take 0.4 mg by mouth daily.     timolol 0.5 % ophthalmic solution  Commonly known as:  TIMOPTIC  Place 1 drop into both eyes every morning.          SignedMosetta Pigeon 11-Oct-2013, 8:50 AM

## 2013-09-26 DEATH — deceased

## 2014-06-19 IMAGING — CR DG CHEST 1V PORT
1 series · 1 of 1 positions shown · non-contrast
Comparison: 08/06/2013

CLINICAL DATA: Former smoker, preoperative study, history of lung
cancer

EXAM:
PORTABLE CHEST - 1 VIEW

[AP]
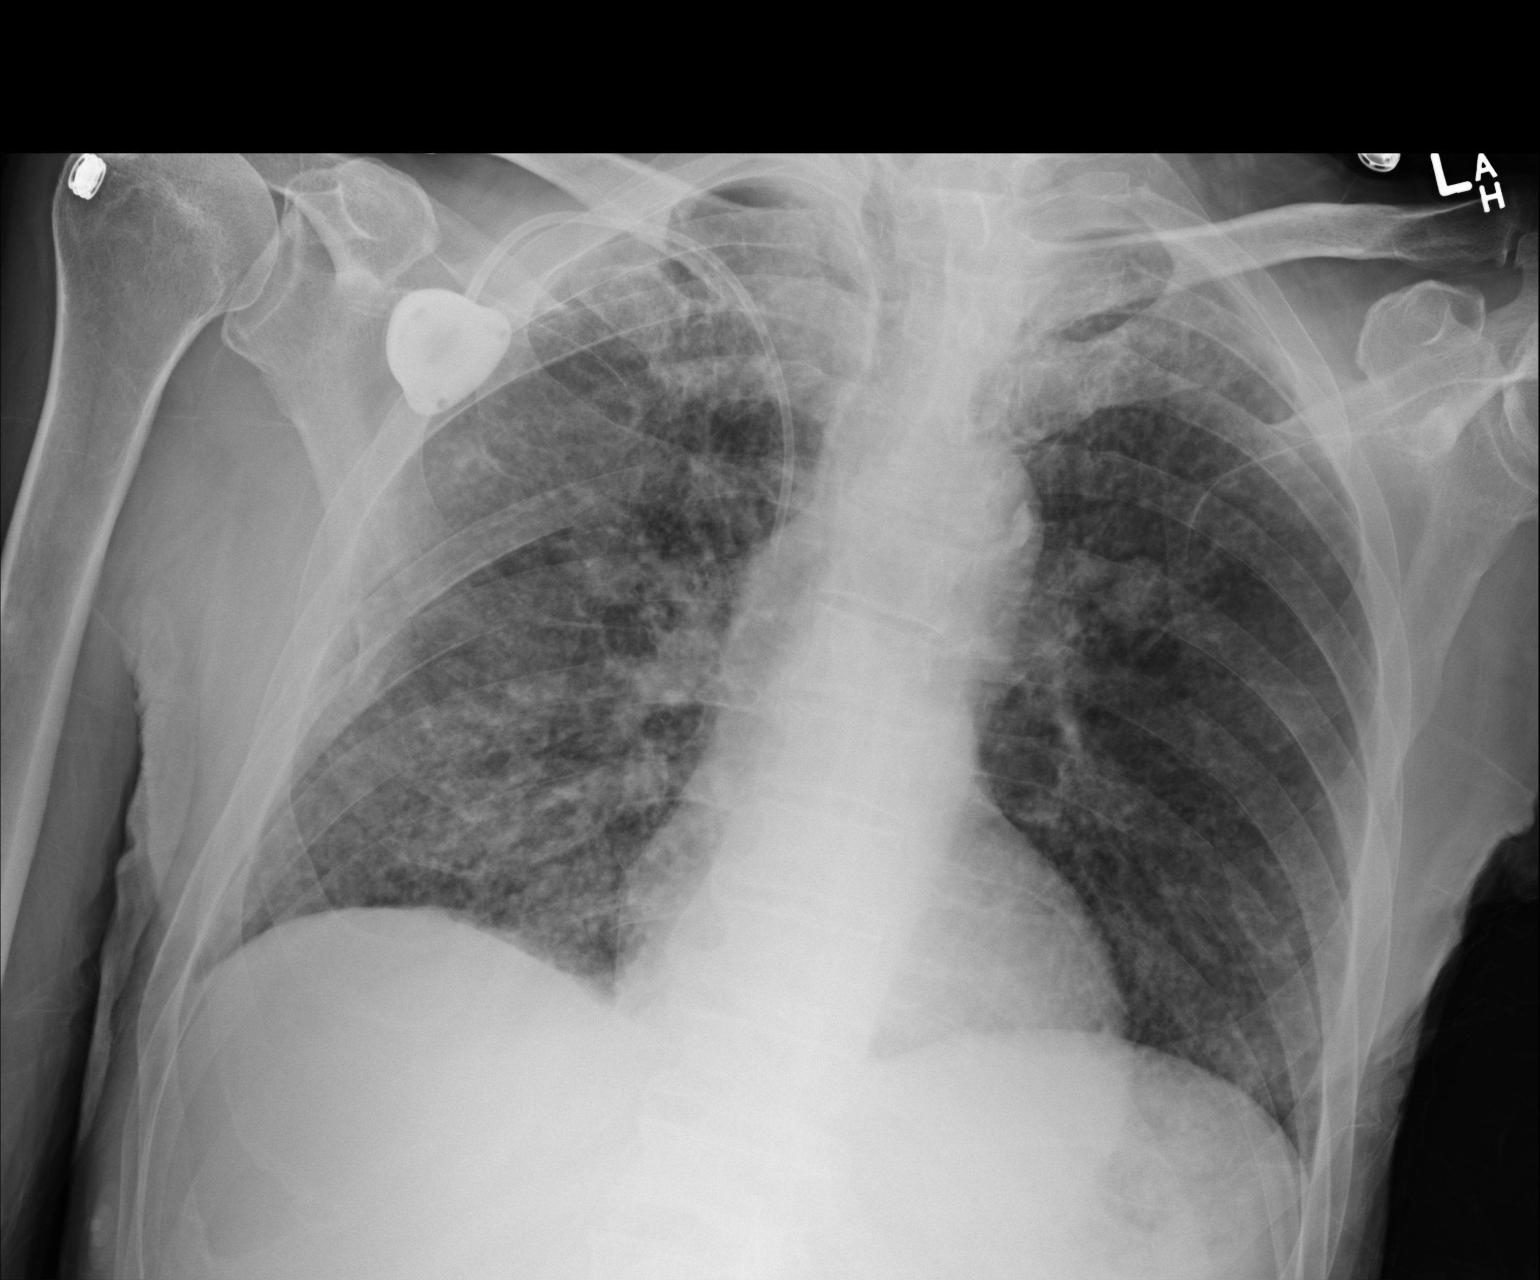

[1 of 1 positions shown; findings below may reference images not displayed]

FINDINGS: Heart size and mediastinal contours normal. Port-A-Cath in unchanged
position. Moderately prominent bilateral diffuse reticular nodular
infiltrates. This appears more prominent when compared to recent
prior study. It also appears more prominent when compared to
03/18/2013. Known right perihilar mass is somewhat obscured by the
reticular nodular process but grossly unchanged. Stable expansile
lytic lesion of posterior right 5th rib as previously described.
IMPRESSION: With similar radiographic technique there is an increase in the
prominence of diffuse reticular nodular opacity bilaterally.
Opportunistic atypical pneumonia would be a consideration. Diffuse
lymphangitic tumor spread would be another possibility.
